# Patient Record
Sex: Female | Born: 1960 | Race: White | Hispanic: No | Marital: Single | Smoking: Never smoker
Health system: Southern US, Community
[De-identification: ages and names within clinical notes are randomized; demographics above are authoritative.]

## PROBLEM LIST (undated history)

## (undated) DIAGNOSIS — N83209 Unspecified ovarian cyst, unspecified side: Secondary | ICD-10-CM

## (undated) DIAGNOSIS — I2699 Other pulmonary embolism without acute cor pulmonale: Secondary | ICD-10-CM

## (undated) DIAGNOSIS — I82409 Acute embolism and thrombosis of unspecified deep veins of unspecified lower extremity: Secondary | ICD-10-CM

## (undated) DIAGNOSIS — E7800 Pure hypercholesterolemia, unspecified: Secondary | ICD-10-CM

## (undated) DIAGNOSIS — M1990 Unspecified osteoarthritis, unspecified site: Secondary | ICD-10-CM

## (undated) DIAGNOSIS — N200 Calculus of kidney: Secondary | ICD-10-CM

## (undated) HISTORY — PX: OVARIAN CYST REMOVAL: SHX89

---

## 1998-02-16 ENCOUNTER — Emergency Department (HOSPITAL_COMMUNITY): Admission: EM | Admit: 1998-02-16 | Discharge: 1998-02-16 | Payer: Self-pay | Admitting: *Deleted

## 2000-02-03 ENCOUNTER — Inpatient Hospital Stay (HOSPITAL_COMMUNITY): Admission: EM | Admit: 2000-02-03 | Discharge: 2000-02-05 | Payer: Self-pay | Admitting: Emergency Medicine

## 2000-02-03 ENCOUNTER — Encounter: Payer: Self-pay | Admitting: Emergency Medicine

## 2001-01-18 ENCOUNTER — Emergency Department (HOSPITAL_COMMUNITY): Admission: EM | Admit: 2001-01-18 | Discharge: 2001-01-19 | Payer: Self-pay | Admitting: Emergency Medicine

## 2004-08-08 ENCOUNTER — Emergency Department (HOSPITAL_COMMUNITY): Admission: EM | Admit: 2004-08-08 | Discharge: 2004-08-08 | Payer: Self-pay | Admitting: Emergency Medicine

## 2004-11-09 ENCOUNTER — Emergency Department (HOSPITAL_COMMUNITY): Admission: EM | Admit: 2004-11-09 | Discharge: 2004-11-10 | Payer: Self-pay

## 2004-11-09 ENCOUNTER — Ambulatory Visit: Payer: Self-pay | Admitting: Internal Medicine

## 2005-09-08 ENCOUNTER — Emergency Department (HOSPITAL_COMMUNITY): Admission: EM | Admit: 2005-09-08 | Discharge: 2005-09-08 | Payer: Self-pay | Admitting: Emergency Medicine

## 2005-09-10 ENCOUNTER — Emergency Department (HOSPITAL_COMMUNITY): Admission: EM | Admit: 2005-09-10 | Discharge: 2005-09-10 | Payer: Self-pay | Admitting: *Deleted

## 2005-10-12 ENCOUNTER — Emergency Department (HOSPITAL_COMMUNITY): Admission: EM | Admit: 2005-10-12 | Discharge: 2005-10-12 | Payer: Self-pay | Admitting: Emergency Medicine

## 2005-10-24 ENCOUNTER — Emergency Department (HOSPITAL_COMMUNITY): Admission: EM | Admit: 2005-10-24 | Discharge: 2005-10-24 | Payer: Self-pay | Admitting: Emergency Medicine

## 2006-01-17 ENCOUNTER — Emergency Department (HOSPITAL_COMMUNITY): Admission: EM | Admit: 2006-01-17 | Discharge: 2006-01-17 | Payer: Self-pay | Admitting: Emergency Medicine

## 2006-01-19 ENCOUNTER — Emergency Department (HOSPITAL_COMMUNITY): Admission: EM | Admit: 2006-01-19 | Discharge: 2006-01-19 | Payer: Self-pay | Admitting: Emergency Medicine

## 2006-01-23 ENCOUNTER — Emergency Department (HOSPITAL_COMMUNITY): Admission: EM | Admit: 2006-01-23 | Discharge: 2006-01-24 | Payer: Self-pay | Admitting: *Deleted

## 2006-01-28 ENCOUNTER — Emergency Department (HOSPITAL_COMMUNITY): Admission: EM | Admit: 2006-01-28 | Discharge: 2006-01-29 | Payer: Self-pay | Admitting: Emergency Medicine

## 2006-08-04 ENCOUNTER — Emergency Department (HOSPITAL_COMMUNITY): Admission: EM | Admit: 2006-08-04 | Discharge: 2006-08-04 | Payer: Self-pay | Admitting: Emergency Medicine

## 2006-08-05 ENCOUNTER — Emergency Department (HOSPITAL_COMMUNITY): Admission: EM | Admit: 2006-08-05 | Discharge: 2006-08-05 | Payer: Self-pay | Admitting: Emergency Medicine

## 2006-08-22 ENCOUNTER — Emergency Department (HOSPITAL_COMMUNITY): Admission: EM | Admit: 2006-08-22 | Discharge: 2006-08-22 | Payer: Self-pay | Admitting: Emergency Medicine

## 2006-10-01 ENCOUNTER — Emergency Department (HOSPITAL_COMMUNITY): Admission: EM | Admit: 2006-10-01 | Discharge: 2006-10-01 | Payer: Self-pay | Admitting: Emergency Medicine

## 2007-01-13 ENCOUNTER — Emergency Department (HOSPITAL_COMMUNITY): Admission: EM | Admit: 2007-01-13 | Discharge: 2007-01-13 | Payer: Self-pay | Admitting: Emergency Medicine

## 2008-08-14 ENCOUNTER — Emergency Department (HOSPITAL_COMMUNITY): Admission: EM | Admit: 2008-08-14 | Discharge: 2008-08-14 | Payer: Self-pay | Admitting: Emergency Medicine

## 2008-12-14 ENCOUNTER — Emergency Department (HOSPITAL_COMMUNITY): Admission: EM | Admit: 2008-12-14 | Discharge: 2008-12-14 | Payer: Self-pay | Admitting: Emergency Medicine

## 2009-02-18 ENCOUNTER — Emergency Department (HOSPITAL_COMMUNITY): Admission: EM | Admit: 2009-02-18 | Discharge: 2009-02-18 | Payer: Self-pay | Admitting: Emergency Medicine

## 2009-07-09 ENCOUNTER — Emergency Department (HOSPITAL_COMMUNITY): Admission: EM | Admit: 2009-07-09 | Discharge: 2009-07-09 | Payer: Self-pay | Admitting: Emergency Medicine

## 2009-09-11 ENCOUNTER — Emergency Department (HOSPITAL_COMMUNITY): Admission: EM | Admit: 2009-09-11 | Discharge: 2009-09-11 | Payer: Self-pay | Admitting: Emergency Medicine

## 2009-09-12 ENCOUNTER — Emergency Department (HOSPITAL_COMMUNITY): Admission: EM | Admit: 2009-09-12 | Discharge: 2009-09-12 | Payer: Self-pay | Admitting: Emergency Medicine

## 2009-10-17 ENCOUNTER — Emergency Department (HOSPITAL_COMMUNITY): Admission: EM | Admit: 2009-10-17 | Discharge: 2009-10-17 | Payer: Self-pay | Admitting: Emergency Medicine

## 2009-10-29 ENCOUNTER — Encounter: Payer: Self-pay | Admitting: Internal Medicine

## 2009-11-01 ENCOUNTER — Telehealth: Payer: Self-pay | Admitting: Internal Medicine

## 2009-12-28 ENCOUNTER — Emergency Department (HOSPITAL_COMMUNITY): Admission: EM | Admit: 2009-12-28 | Discharge: 2009-12-28 | Payer: Self-pay | Admitting: Emergency Medicine

## 2010-01-17 ENCOUNTER — Emergency Department (HOSPITAL_COMMUNITY): Admission: EM | Admit: 2010-01-17 | Discharge: 2010-01-17 | Payer: Self-pay | Admitting: Emergency Medicine

## 2010-04-05 ENCOUNTER — Emergency Department (HOSPITAL_COMMUNITY): Admission: EM | Admit: 2010-04-05 | Discharge: 2010-04-05 | Payer: Self-pay | Admitting: Emergency Medicine

## 2010-07-11 ENCOUNTER — Emergency Department (HOSPITAL_COMMUNITY): Admission: EM | Admit: 2010-07-11 | Discharge: 2010-07-11 | Payer: Self-pay | Admitting: Emergency Medicine

## 2010-07-22 ENCOUNTER — Emergency Department (HOSPITAL_COMMUNITY): Admission: EM | Admit: 2010-07-22 | Discharge: 2010-07-22 | Payer: Self-pay | Admitting: Emergency Medicine

## 2010-07-25 ENCOUNTER — Telehealth: Payer: Self-pay | Admitting: Internal Medicine

## 2010-07-31 ENCOUNTER — Encounter: Payer: Self-pay | Admitting: Internal Medicine

## 2010-09-26 ENCOUNTER — Emergency Department (HOSPITAL_COMMUNITY)
Admission: EM | Admit: 2010-09-26 | Discharge: 2010-09-26 | Payer: Self-pay | Source: Home / Self Care | Admitting: Emergency Medicine

## 2010-09-29 ENCOUNTER — Encounter (INDEPENDENT_AMBULATORY_CARE_PROVIDER_SITE_OTHER): Payer: Self-pay | Admitting: *Deleted

## 2010-11-07 ENCOUNTER — Encounter (INDEPENDENT_AMBULATORY_CARE_PROVIDER_SITE_OTHER): Payer: Self-pay | Admitting: *Deleted

## 2010-11-07 ENCOUNTER — Ambulatory Visit
Admission: RE | Admit: 2010-11-07 | Discharge: 2010-11-07 | Payer: Self-pay | Source: Home / Self Care | Attending: Internal Medicine | Admitting: Internal Medicine

## 2010-11-07 ENCOUNTER — Emergency Department (HOSPITAL_COMMUNITY)
Admission: EM | Admit: 2010-11-07 | Discharge: 2010-11-07 | Payer: Self-pay | Source: Home / Self Care | Admitting: Emergency Medicine

## 2010-11-07 DIAGNOSIS — F810 Specific reading disorder: Secondary | ICD-10-CM | POA: Insufficient documentation

## 2010-11-07 DIAGNOSIS — Z87442 Personal history of urinary calculi: Secondary | ICD-10-CM | POA: Insufficient documentation

## 2010-11-07 DIAGNOSIS — N83209 Unspecified ovarian cyst, unspecified side: Secondary | ICD-10-CM | POA: Insufficient documentation

## 2010-11-07 DIAGNOSIS — D72819 Decreased white blood cell count, unspecified: Secondary | ICD-10-CM | POA: Insufficient documentation

## 2010-11-07 DIAGNOSIS — K0520 Aggressive periodontitis, unspecified: Secondary | ICD-10-CM | POA: Insufficient documentation

## 2010-11-07 DIAGNOSIS — H2100 Hyphema, unspecified eye: Secondary | ICD-10-CM | POA: Insufficient documentation

## 2010-11-07 DIAGNOSIS — R7401 Elevation of levels of liver transaminase levels: Secondary | ICD-10-CM | POA: Insufficient documentation

## 2010-11-07 DIAGNOSIS — R740 Nonspecific elevation of levels of transaminase and lactic acid dehydrogenase [LDH]: Secondary | ICD-10-CM

## 2010-11-08 ENCOUNTER — Encounter (INDEPENDENT_AMBULATORY_CARE_PROVIDER_SITE_OTHER): Payer: Self-pay | Admitting: *Deleted

## 2010-11-08 ENCOUNTER — Encounter: Payer: Self-pay | Admitting: Internal Medicine

## 2010-11-14 ENCOUNTER — Encounter: Payer: Self-pay | Admitting: Internal Medicine

## 2010-11-15 ENCOUNTER — Encounter: Payer: Self-pay | Admitting: Internal Medicine

## 2010-11-16 ENCOUNTER — Encounter: Payer: Self-pay | Admitting: Internal Medicine

## 2010-11-22 ENCOUNTER — Encounter (INDEPENDENT_AMBULATORY_CARE_PROVIDER_SITE_OTHER): Payer: Self-pay | Admitting: *Deleted

## 2010-11-23 NOTE — Progress Notes (Signed)
Summary: Clinical list update--flu vaccine  Phone Note From Pharmacy   Caller: Karin Golden [place] Dr. Rondall Allegra [place] Summary of Call: Made note of flu vaccine given at pharmacy. Initial call taken by: Lucious Groves,  November 01, 2009 11:08 AM      Immunization History:  Influenza Immunization History:    Influenza:  historical (10/29/2009)

## 2010-11-23 NOTE — Miscellaneous (Signed)
Summary: [facility]  [facility]   Imported By: Lester Davenport 11/03/2009 08:25:57  _____________________________________________________________________  External Attachment:    Type:   Image     Comment:   External Document

## 2010-11-23 NOTE — Letter (Signed)
Summary: Discharge Letter  [facility]  [address] [place], [place] [address]   Phone: [phone]  Fax: [phone]       11/08/2010 MRN: [id number]  YANELI KEITHLEY [address] [place], [place]  [address]  Dear [name],   I find it necessary to inform you that I will not be able to provide medical care to you, because you left during the intake visit stating that you were uncomfortable continuing the process of establishing as a patient with me in this practice.  I suggest that you place your self under the care of another physician without delay. Being that you have not established with the Practice should you have any acute medical problem you should seek assistance at an emergency room or urgent care center until you can establish with another primary care physician. You may want to call the local medical society or [facility]'s physician referral service [phone]) for their assistance in locating a new physician. With your written authorization, I will make a copy of your medical record available to your new physician.   Sincerely,    Illene Regulus MD

## 2010-11-23 NOTE — Letter (Signed)
Summary: Discharge Letter  [facility]  [address] [place], [place] [address]   Phone: [phone]  Fax: [phone]       11/15/2010 MRN: [id number]  Carrie [name] [address] [place], [place]  [address]  Dear [name],   I find it necessary to inform you that I will not be able to provide medical care to you, because of a failed doctor-patient.  I suggest that you place your self under the care of another physician without delay. If you desire, I will be available for emergency care for 30 days after you receive this letter.  This should give you ample time to select a physician of your choice from the many competent providers in this area. You may want to call the local medical society or [facility]'s physician referral service [phone]) for their assistance in locating a new physician. With your written authorization, I will make a copy of your medical record available to your new physician.   Sincerely,    Illene Regulus MD

## 2010-11-23 NOTE — Assessment & Plan Note (Addendum)
Summary: NEW/ MEDCOST /NWS  #   Vital Signs:  Patient profile:   50 year old female Height:      64 inches Weight:      174 pounds BMI:     29.97 Temp:     99.4 degrees F oral Pulse rate:   106 / minute Pulse rhythm:   regular Resp:     16 per minute BP sitting:   130 / 80  (left arm) Cuff size:   regular  Vitals Entered By: Lanier Prude, CMA(AAMA) (November 07, 2010 1:34 PM) CC: Radiaiton poisoning  and Leukopenia Is Patient Diabetic? No Comments pt had freeze dried periodontal bone grafting in 06/2010 and then had implant removed because she experienced leg weakness and numbness.   Primary Care Provider:  Illene Regulus  CC:  Radiaiton poisoning  and Leukopenia.  History of Present Illness: Patient presents for to establish for on-going continuity care.   Her chief complaint has to do with her concern about the effects of bone implant for oromaximal surgery using cobalt treated cadaveric bone. Feels she has had radiation sickness. Her complex of symptoms include  paresthesia, weakness. She was insistent that this issue be addressed immediately so she could be cleared for surgery.   She describes herself as healthy without any other healthcare concerns.   Current Medications (verified): 1)  Prenavite Multiple Vitamin 28-0.8 Mg Tabs (Prenatal Vit-Fe Fumarate-Fa) .Marland Kitchen.. 1 By Mouth Once Daily  Allergies (verified): 1)  ! Radioactive Material 2)  ! * Contrast Dye  Past History:  Past Medical History: UCD - chicken pox and measles. 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  Resumed in the past several Dental extractions.  Family History: father - deceased @ 13: lung cancer, smoker, prostate, HTN mother - 30: lung -scca, right lung s/p lobectomy, smoker, LIpids, HTN Neg - breast or colon cancer; DM; CAD/MI;   Social History: HSG, AA degree, Adm degree, in school for BA married '98 - less than a year, annuled.  Lives alone; not sexually active No physical or sexual abuse. No smoke, no drink.  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  Family in area: mother in [place], sister in [place], sister [place], sister in [place] -phlobotomist ; brother in [place]  Review of Systems  The patient denies anorexia, fever, weight loss, weight gain, vision loss, decreased hearing, chest pain, syncope, dyspnea on exertion, peripheral edema, hemoptysis, abdominal pain, severe indigestion/heartburn, incontinence, muscle weakness, suspicious skin lesions, difficulty walking, depression, abnormal bleeding, enlarged lymph nodes, angioedema, and breast masses.    Physical Exam  General:  WNWD white woman who seems agitated with rapid and pressured speech, pacing the exam room, unable to stay focused on giving a history, continuously shuffling through a file of papers and records she brought with her.    Impression & Recommendations:  Problem # 1:  termination of visit after getting through the balance of the intake history, while reviewing her lab reports which seemed unremarkable, the patient became increasingly agitated. She stated that she "felt bad and uncomfortable" and stated she had to leave. She gathered her documents and left the exam room and the clinical area. Of note  the patient was never examined having terminated the visit prior to any physical exam.  Her behavior was aberrant: obsessive about her issues, agitated with rapid pressured speech and impatient with the intake process.  Having walked out of the  visit she was not established as a patient. A discharge letter will be generated. For any other medical care issues she will need to report to the Emergency Department until such time as she can establish with a medical practice.   Complete Medication List: 1)  Prenavite Multiple Vitamin 28-0.8 Mg Tabs (Prenatal vit-fe fumarate-fa) .Marland Kitchen.. 1 by mouth once daily  Other Orders: No Charge Patient Arrived (NCPA0) (NCPA0)   Orders Added: 1)  No Charge Patient Arrived (NCPA0) [NCPA0]  Appended Document: NEW/ MEDCOST /NWS  # The patient requested this visit to be amended.    [facility] Legal Reference Manual 7th edition states the following under G. Patient Requests to [facility] Records (paragraph 4, page 6):  "The provider must append or link the request for information, denial, any written statement of disagreement, and any rebuttal to the disputed information or record.  When making future disclosures of this information, the provider must include copies of these documents when the individual has either submitted a statement of disagreement or requests that the provider include a copy of the documents."  Please see the following scanned documents related to the request.  Please contact HIM Manager prior to releasing any of these scanned documents.  09/29/10 - Internal Other - Walk In Patient Form 11/07/10 - External Correspondence - Letter Typed by patient - Birdie Fetty refused to sign; (she wanted Dr. Debby Bud to sign this letter.) 11/08/10 - Ext. Corr: Handwritten Note by Patient 11/08/10 - Int.Oth: Transcript of Admin. Mtg. w/pt. 11/14/10 - Ext Corr.: 1st Letter Requesting to Amend Med. Red. 11/16/10 - Int Oth.:  Admin E-mails related to request 11/22/10 - Ext. Corr.:  [employer] Letter 11/24/10 - Letter:  Denial to Amend Med. Rec.  Appended Document: NEW/ MEDCOST /NWS  # Also see the followind document dropped off by the patient:  12/05/2010 - External Correspondence - Letter of Dissent from  Patient

## 2010-11-23 NOTE — Letter (Addendum)
Summary: Discharge Letter  [facility]  [address] [place], [place] [address]   Phone: [phone]  Fax: [phone]       11/15/2010 MRN: [id number]  Carrie [name] [address] [place], [place]  [address]  Dear [name],   I find it necessary to inform you that I will not be able to provide medical care to you due to a failed doctor-patient relationship.  Since your condition requires medical attention, I suggest that you place your self under the care of another physician without delay. If you desire, I will be available for emergency care for 30 days after you receive this letter.  This should give you ample time to select a physician of your choice from the many competent providers in this area. You may want to call the local medical society or [facility]'s physician referral service [phone]) for their assistance in locating a new physician. With your written authorization, I will make a copy of your medical record available to your new physician.   Sincerely,    Illene Regulus MD  Appended Document: Letter mailed IDX and EMR updated to reflect dismissal. Letter sent out by Registered / Certified mail.   Appended Document: Copy of Discharge Letter remailed Copy of letter dated 11/15/10 sent out by certified/registered mail.  Appended Document: USPS receipt received USPS signed receipt numbered [id number] received at [facility] on 12/26/10. This receipt is from the letter mailed on 12/20/10. Receipt will be scanned into EMR to support delivery of letter.

## 2010-11-23 NOTE — Progress Notes (Signed)
Summary: SWITCH PCP  Phone Note Call from Patient Call back at [facility] Phone [phone]   Caller: Patient Summary of Call: PT SAW DR Jonny Ruiz IN 2006 AS A NEW PT.  SHE WANTS TO SWITCH TO DR. Debby Bud.  SHE IS SCHEDULED TO SEE DR YOO ON FRIDAY AS A NEW PT, BUT WOULD RATHER COME HERE.  SHE PLANS TO CANCEL THE APPT WITH DR Artist Pais.  OK TO SWITCH? Initial call taken by: Hilarie Fredrickson,  July 25, 2010 9:02 AM  Follow-up for Phone Call        ok with me Follow-up by: Corwin Levins MD,  July 25, 2010 9:44 AM  Additional Follow-up for Phone Call Additional follow up Details #1::        OK Additional Follow-up by: Jacques Navy MD,  July 25, 2010 3:38 PM    Additional Follow-up for Phone Call Additional follow up Details #2::    PT IS AWARE AND HAS SCHEDULED AN APPT. Follow-up by: Hilarie Fredrickson,  July 25, 2010 4:45 PM

## 2010-11-23 NOTE — Letter (Signed)
Summary: [facility]   Imported By: Sherian Rein 08/04/2010 12:23:32  _____________________________________________________________________  External Attachment:    Type:   Image     Comment:   External Document

## 2010-11-24 ENCOUNTER — Encounter: Payer: Self-pay | Admitting: Internal Medicine

## 2010-11-24 ENCOUNTER — Encounter (INDEPENDENT_AMBULATORY_CARE_PROVIDER_SITE_OTHER): Payer: Self-pay | Admitting: *Deleted

## 2010-12-05 ENCOUNTER — Encounter (INDEPENDENT_AMBULATORY_CARE_PROVIDER_SITE_OTHER): Payer: Self-pay | Admitting: *Deleted

## 2010-12-07 NOTE — Letter (Signed)
Summary: Transcript of Admin. Mtg. w/pt.  Transcript of Admin. Mtg. w/pt.   Imported By: Briant Cedar 11/27/2010 13:47:21  _____________________________________________________________________  External Attachment:    Type:   Image     Comment:   External Document

## 2010-12-07 NOTE — Letter (Signed)
Summary: Administrative e-mails related to request  Administrative e-mails related to request   Imported By: Briant Cedar 11/27/2010 13:38:15  _____________________________________________________________________  External Attachment:    Type:   Image     Comment:   External Document

## 2010-12-07 NOTE — Letter (Signed)
Summary: Walk In Patient Form  Walk In Patient Form   Imported By: Briant Cedar 11/27/2010 13:45:24  _____________________________________________________________________  External Attachment:    Type:   Image     Comment:   External Document

## 2010-12-07 NOTE — Letter (Signed)
Summary: Denial to Amend Med. Rec.  Denial to Amend Med. Rec.   Imported By: Briant Cedar 11/27/2010 13:48:47  _____________________________________________________________________  External Attachment:    Type:   Image     Comment:   External Document

## 2010-12-07 NOTE — Letter (Signed)
Summary: 1st Letter Requesting to Amend Medical Record  1st Letter Requesting to Amend Medical Record   Imported By: Briant Cedar 11/27/2010 13:36:04  _____________________________________________________________________  External Attachment:    Type:   Image     Comment:   External Document

## 2010-12-07 NOTE — Letter (Signed)
Summary: [employer] Letter   [employer] Letter   Imported By: Briant Cedar 11/27/2010 13:50:27  _____________________________________________________________________  External Attachment:    Type:   Image     Comment:   External Document

## 2010-12-07 NOTE — Letter (Signed)
Summary: Handwritten Note by Patient  Handwritten Note by Patient   Imported By: Briant Cedar 11/27/2010 13:44:01  _____________________________________________________________________  External Attachment:    Type:   Image     Comment:   External Document

## 2010-12-07 NOTE — Letter (Signed)
Summary: Letter typed by patient-Norins refused to sign  Letter typed by patient-Norins refused to sign   Imported By: Briant Cedar 11/27/2010 13:42:33  _____________________________________________________________________  External Attachment:    Type:   Image     Comment:   External Document

## 2010-12-13 NOTE — Letter (Signed)
Summary: Letter of Dissent from Patient  Letter of Dissent from Patient   Imported By: Briant Cedar 12/07/2010 14:07:38  _____________________________________________________________________  External Attachment:    Type:   Image     Comment:   External Document

## 2010-12-19 ENCOUNTER — Telehealth (INDEPENDENT_AMBULATORY_CARE_PROVIDER_SITE_OTHER): Payer: Self-pay | Admitting: *Deleted

## 2010-12-20 ENCOUNTER — Telehealth (INDEPENDENT_AMBULATORY_CARE_PROVIDER_SITE_OTHER): Payer: Self-pay | Admitting: *Deleted

## 2010-12-22 ENCOUNTER — Encounter (INDEPENDENT_AMBULATORY_CARE_PROVIDER_SITE_OTHER): Payer: Self-pay | Admitting: *Deleted

## 2010-12-26 ENCOUNTER — Emergency Department (HOSPITAL_COMMUNITY): Payer: PRIVATE HEALTH INSURANCE

## 2010-12-26 ENCOUNTER — Emergency Department (HOSPITAL_COMMUNITY)
Admission: EM | Admit: 2010-12-26 | Discharge: 2010-12-26 | Disposition: A | Discharge status: Home / Self Care | Payer: PRIVATE HEALTH INSURANCE | Attending: Emergency Medicine | Admitting: Emergency Medicine

## 2010-12-26 DIAGNOSIS — S9030XA Contusion of unspecified foot, initial encounter: Secondary | ICD-10-CM | POA: Insufficient documentation

## 2010-12-26 DIAGNOSIS — M79609 Pain in unspecified limb: Secondary | ICD-10-CM | POA: Insufficient documentation

## 2010-12-26 DIAGNOSIS — IMO0002 Reserved for concepts with insufficient information to code with codable children: Secondary | ICD-10-CM | POA: Insufficient documentation

## 2010-12-28 NOTE — Progress Notes (Signed)
  Phone Note Other Incoming   Request: Send information Summary of Call: Patient completed a [facility] HIPAA Restrictions Notice of Privacy Practices Receipt.

## 2010-12-28 NOTE — Progress Notes (Signed)
Summary: Call from patient concerning letter   _____________________________________________________________________  External Attachment:    Type:   Image     Comment:   External DocumentPhone Note Call from Patient   Caller: Patient Details for Reason: Refusal of Certified Letter Details of Complaint: Letter arrived with postage due. Details of Action Taken: Will send out second letter. Summary of Call: Patient called refusing to pay for postage for a certified letter. I informed her that the letter was sent out with the postage paid and if she was being asked to pay for it to simply refuse and I would send out another letter. She asked me the natural of the letter and if she could just swing by and pick one up. I told her that was not necessary we would send out a copy immediately. I did not discuss the contents of the letter even though she wanted to know if I was the Manager who was in her taped request?   Sheria Lang was called to find out if the letter dated 11/24/10 was sent out by Certified or First Class Mail to determine which letter this call was concerning. Cameron's letter was mailed first class. The Discharge from the Practice letter was mailed 11/15/10. Upon checking the USPS website (see attached external document) it is documented that they have made several attempts to deliver and have left notices. In the event the postage sticker may be missing from the letter I will send out a second certified letter so this patient can be informed of the contents instead of waiting for the 1st letter to be returned.  Initial call taken by: Lenard Forth  Follow-up for Phone Call        Letter sent out by certified mail 12/20/10.

## 2011-01-01 LAB — CBC
HCT: 40.3 % (ref 36.0–46.0)
Hemoglobin: 13.7 g/dL (ref 12.0–15.0)
MCH: 29.9 pg (ref 26.0–34.0)
MCV: 88 fL (ref 78.0–100.0)
Platelets: 202 10*3/uL (ref 150–400)
RBC: 4.58 MIL/uL (ref 3.87–5.11)
RDW: 13.3 % (ref 11.5–15.5)

## 2011-01-01 LAB — DIFFERENTIAL
Basophils Relative: 0 % (ref 0–1)
Lymphocytes Relative: 35 % (ref 12–46)
Neutro Abs: 2.9 10*3/uL (ref 1.7–7.7)

## 2011-01-02 NOTE — Letter (Signed)
Summary: Response to Request for Amendment  Response to Request for Amendment   Imported By: Briant Cedar 12/25/2010 11:45:54  _____________________________________________________________________  External Attachment:    Type:   Image     Comment:   External Document

## 2011-01-03 LAB — URINALYSIS, ROUTINE W REFLEX MICROSCOPIC
Glucose, UA: NEGATIVE mg/dL
Hgb urine dipstick: NEGATIVE
Nitrite: NEGATIVE
pH: 5.5 (ref 5.0–8.0)

## 2011-01-03 LAB — COMPREHENSIVE METABOLIC PANEL
Albumin: 3.5 g/dL (ref 3.5–5.2)
Alkaline Phosphatase: 39 U/L (ref 39–117)
CO2: 22 mEq/L (ref 19–32)
GFR calc Af Amer: >60 mL/min (ref >60)
GFR calc non Af Amer: >60 mL/min (ref >60)
Glucose, Bld: 90 mg/dL (ref 70–99)
Total Bilirubin: 0.6 mg/dL (ref 0.3–1.2)

## 2011-01-03 LAB — CBC
Hemoglobin: 13.5 g/dL (ref 12.0–15.0)
MCH: 29.9 pg (ref 26.0–34.0)
MCHC: 33.9 g/dL (ref 30.0–36.0)
MCV: 88.2 fL (ref 78.0–100.0)
RBC: 4.51 MIL/uL (ref 3.87–5.11)

## 2011-01-03 LAB — POCT PREGNANCY, URINE: Preg Test, Ur: NEGATIVE

## 2011-01-03 LAB — DIFFERENTIAL
Eosinophils Absolute: 0 10*3/uL (ref 0.0–0.7)
Eosinophils Relative: 0 % (ref 0–5)
Lymphocytes Relative: 21 % (ref 12–46)
Lymphs Abs: 1.1 10*3/uL (ref 0.7–4.0)
Neutrophils Relative %: 71 % (ref 43–77)

## 2011-01-09 NOTE — Letter (Signed)
Summary: Patient Dismissal Activation Form & USPS receipt  Patient Dismissal Activation Form & USPS receipt   Imported By: Lenard Forth 01/02/2011 18:00:02  _____________________________________________________________________  External Attachment:    Type:   Image     Comment:   External Document

## 2011-01-12 LAB — BASIC METABOLIC PANEL
BUN: 10 mg/dL (ref 6–23)
CO2: 25 mEq/L (ref 19–32)
Chloride: 108 mEq/L (ref 96–112)
Creatinine, Ser: 0.57 mg/dL (ref 0.4–1.2)
GFR calc Af Amer: >60 mL/min (ref >60)
GFR calc non Af Amer: >60 mL/min (ref >60)
Glucose, Bld: 110 mg/dL — ABNORMAL HIGH (ref 70–99)
Glucose, Bld: 98 mg/dL (ref 70–99)
Potassium: 3.7 mEq/L (ref 3.5–5.1)
Potassium: 4 mEq/L (ref 3.5–5.1)
Sodium: 142 mEq/L (ref 135–145)

## 2011-01-12 LAB — CBC
HCT: 38.5 % (ref 36.0–46.0)
HCT: 40 % (ref 36.0–46.0)
Hemoglobin: 12.9 g/dL (ref 12.0–15.0)
Hemoglobin: 13.8 g/dL (ref 12.0–15.0)
MCHC: 33.6 g/dL (ref 30.0–36.0)
MCHC: 34.6 g/dL (ref 30.0–36.0)
MCV: 88.2 fL (ref 78.0–100.0)
RBC: 4.36 MIL/uL (ref 3.87–5.11)
RDW: 13.9 % (ref 11.5–15.5)
RDW: 14.2 % (ref 11.5–15.5)

## 2011-01-12 LAB — DIFFERENTIAL
Basophils Absolute: 0 10*3/uL (ref 0.0–0.1)
Basophils Relative: 0 % (ref 0–1)
Basophils Relative: 1 % (ref 0–1)
Eosinophils Absolute: 0.1 10*3/uL (ref 0.0–0.7)
Eosinophils Relative: 1 % (ref 0–5)
Eosinophils Relative: 1 % (ref 0–5)
Lymphocytes Relative: 27 % (ref 12–46)
Lymphs Abs: 1.4 10*3/uL (ref 0.7–4.0)
Monocytes Absolute: 0.4 10*3/uL (ref 0.1–1.0)
Monocytes Relative: 8 % (ref 3–12)
Monocytes Relative: 9 % (ref 3–12)

## 2011-01-12 LAB — BRAIN NATRIURETIC PEPTIDE: Pro B Natriuretic peptide (BNP): 50 pg/mL (ref 0.0–100.0)

## 2011-01-12 LAB — POCT CARDIAC MARKERS
Myoglobin, poc: 33 ng/mL (ref 12–200)
Troponin i, poc: <0.05 ng/mL (ref 0.00–0.09)

## 2011-01-12 LAB — TROPONIN I: Troponin I: <0.01 ng/mL (ref 0.00–0.06)

## 2011-01-20 ENCOUNTER — Emergency Department (HOSPITAL_COMMUNITY)
Admission: EM | Admit: 2011-01-20 | Discharge: 2011-01-20 | Disposition: A | Discharge status: Home / Self Care | Payer: PRIVATE HEALTH INSURANCE | Attending: Emergency Medicine | Admitting: Emergency Medicine

## 2011-01-20 DIAGNOSIS — H9209 Otalgia, unspecified ear: Secondary | ICD-10-CM | POA: Insufficient documentation

## 2011-01-20 DIAGNOSIS — H60399 Other infective otitis externa, unspecified ear: Secondary | ICD-10-CM | POA: Insufficient documentation

## 2011-03-09 NOTE — Consult Note (Signed)
[name]  Patient:    Carrie, [name]                        MRN: [id number] Attending:  Barron [name], M.D. CC:         Carrie [name], M.D. [facility]                          Consultation Report  REASON FOR CONSULTATION:  Ureteral calculus.  HISTORY OF PRESENT ILLNESS:  Carrie [name] is a 50 year old white female.  She believes she had one episode of a spontaneously passed stone in the past.  She reports several days of some lower back and left-sided abdominal discomfort. The pain intensified and she presented to the [facility] Emergency Room on February 03, 2000.  Urinalysis was unremarkable.  She did undergo a CT with stone protocol which revealed what appeared to be a 1-2 mm calcification in the area of the distal left ureter near the ureterovesical junction.  There did appear to be some left hydronephrosis per report but I have not evaluated the films myself at this time.  She was admitted for observation and pain management.  The pain has persisted and the patient did not want to be discharged.  Urology consult was obtained.  She has had some mild irritative voiding symptoms consistent with some bladder irritability due to the calculus.  There has been no evidence of infection.  PAST MEDICAL HISTORY:  Significant for depression and anxiety disorder as well as a questionable eating disorder.  PAST SURGICAL HISTORY:  Noncontributory.  PHYSICAL EXAMINATION:  GENERAL:  She appeared to be comfortable.  I found her to be sitting quietly in bed reading a book.  She denied significant discomfort at this time.  VITAL SIGNS:  She has remained afebrile.  Her blood pressure has been 100/53. She has an indwelling Foley catheter with good urinary output.  ABDOMEN:  Completely benign.  There is no CVA tenderness.  LABORATORIES:  Urinalysis again was unremarkable.  Urinary pH is 7.5.  There did not appear to be evidence of significant pyuria nor was there  hematuria. Her creatinine is normal at 0.7.  ASSESSMENT:  Apparent small distal left ureteral calculus.  I will need to review the films.  Certainly, her pain is consistent with renal colic.  Carrie [name] is told that a stone this size should have an 80-90% chance of spontaneous passage.  There does not appear to be an absolute indication for any intervention at this time.  I found her to be quite comfortable in bed and encouraged her strongly to consider attempts at passing the stone spontaneously as an outpatient if she remained as comfortable as she appeared currently.  She is quite anxious about going home and does not want to have any additional pain.  I told her that we would reassess things in the morning and if she did not feel comfortable going home and was continuing to have pain and elected to have a procedure, then we would go ahead.  She is told that the best option for management of a stone this size and in this location is ureteroscopy.  She is told this is a high successful procedure with minimal complications but there is always a risk to surgery and surgery can be expensive.  She is told that again, we would typically encourage her to try to pass this spontaneously but if she desired surgical  intervention then we would certainly consider that as her option. DD:  02/04/00 TD:  02/04/00 Job: [id number]

## 2011-03-09 NOTE — Discharge Summary (Signed)
[place]  Patient:    Carrie [name], DOYLE                        MRN: [id number] Adm. Date:  [id number] Disc. Date: [id number] Attending:  Duke Salvia Dictator:   Cornell Barman, P.A.                           Discharge Summary  DISCHARGE DIAGNOSES: 1. Left nephrolithiasis. 2. Mild anemia.  BRIEF HISTORY:  Ms. Goracke is a 50 year old white female who presented after seven days of left abdominal pain.  She presented to the [facility]  Emergency Department after promotion of her pain.  A CT of her abdomen revealed a 1 to 2 mm stone at the left UPJ.  The patient was admitted for IV fluid and pain control.  PAST MEDICAL HISTORY:  Status post hemorrhoidectomy with anal fissure repair. CD. Sinusitis.  Depression.  History of eating disorder.  Ovarian cyst.  HOSPITAL COURSE:  The patient was admitted for IV fluids and pain control. Urology was asked to see the patient and Dr. Barron Alvine saw the patient on February 04, 2000.  His assessment was that she had a small distal left ureteral calculus. e certainly felt that her pain was consistent with renal colic.  He discussed with Ms. Denman stating that a stone that size should have an 80-90% chance of spontaneous passage.  He did not feel there was any indication for intervention at that time.  The patient appeared comfortable and he recommended letting the stone pass out n its own.  Plans were for the patient to be discharged home the next morning if her pain was under control and let her pass the stone at home.  The patient was to be instructed on to strain her urine.  The next morning, the patient was evaluated and found to be comfortably lying in bed reading book; however, when we mentioned to the patient that she would be able to go home and wait for the stone to pass at home she became angry and agitated. She states that her urologist was planning on  performing surgery on her that morning.  We deferred discharge until Dr. Barron Alvine could see the patient.  He explained to her that it would be in her best interest not to have any surgery if it were not necessary.  He did not feel that surgery was indicated in this case. After hearing what Dr. Barron Alvine had to say she "fired him" and at the same ime requested a second opinion.  When Dr. Valetta Mole. Swords came by to talk with the patient about discharge, she began crying and pulled the covers over her head.  She would not look at Dr. Valetta Mole. Swords.  She states that the reason she has being is discharged because she id not have any surgery.  The patient was reassured that in this case surgery was ot indicated and if she did need surgery or prolonged hospitalization the matter of insurance would not have kept her from receiving services.  The patient eventually did agree to go home; however, she was still quite angry and frustrated.  Prior to going home, the patient did pass a small stone in her urine. After passing the stone, the patient stated she felt much better.  DISCHARGE INSTRUCTIONS:  The patient was instructed to resume her home medication and  to follow up with Dr. Milagros Loll, III in 7 to 10 days. DD:  02/19/00 TD:  02/20/00 Job: [id number]

## 2011-03-10 IMAGING — CR DG FOOT 2V*R*
1 series · 1 of 1 positions shown · non-contrast
Comparison: None

CLINICAL DATA: Heel pain.  Question foreign body.

RIGHT FOOT - 2 VIEW

[t foot ap right]
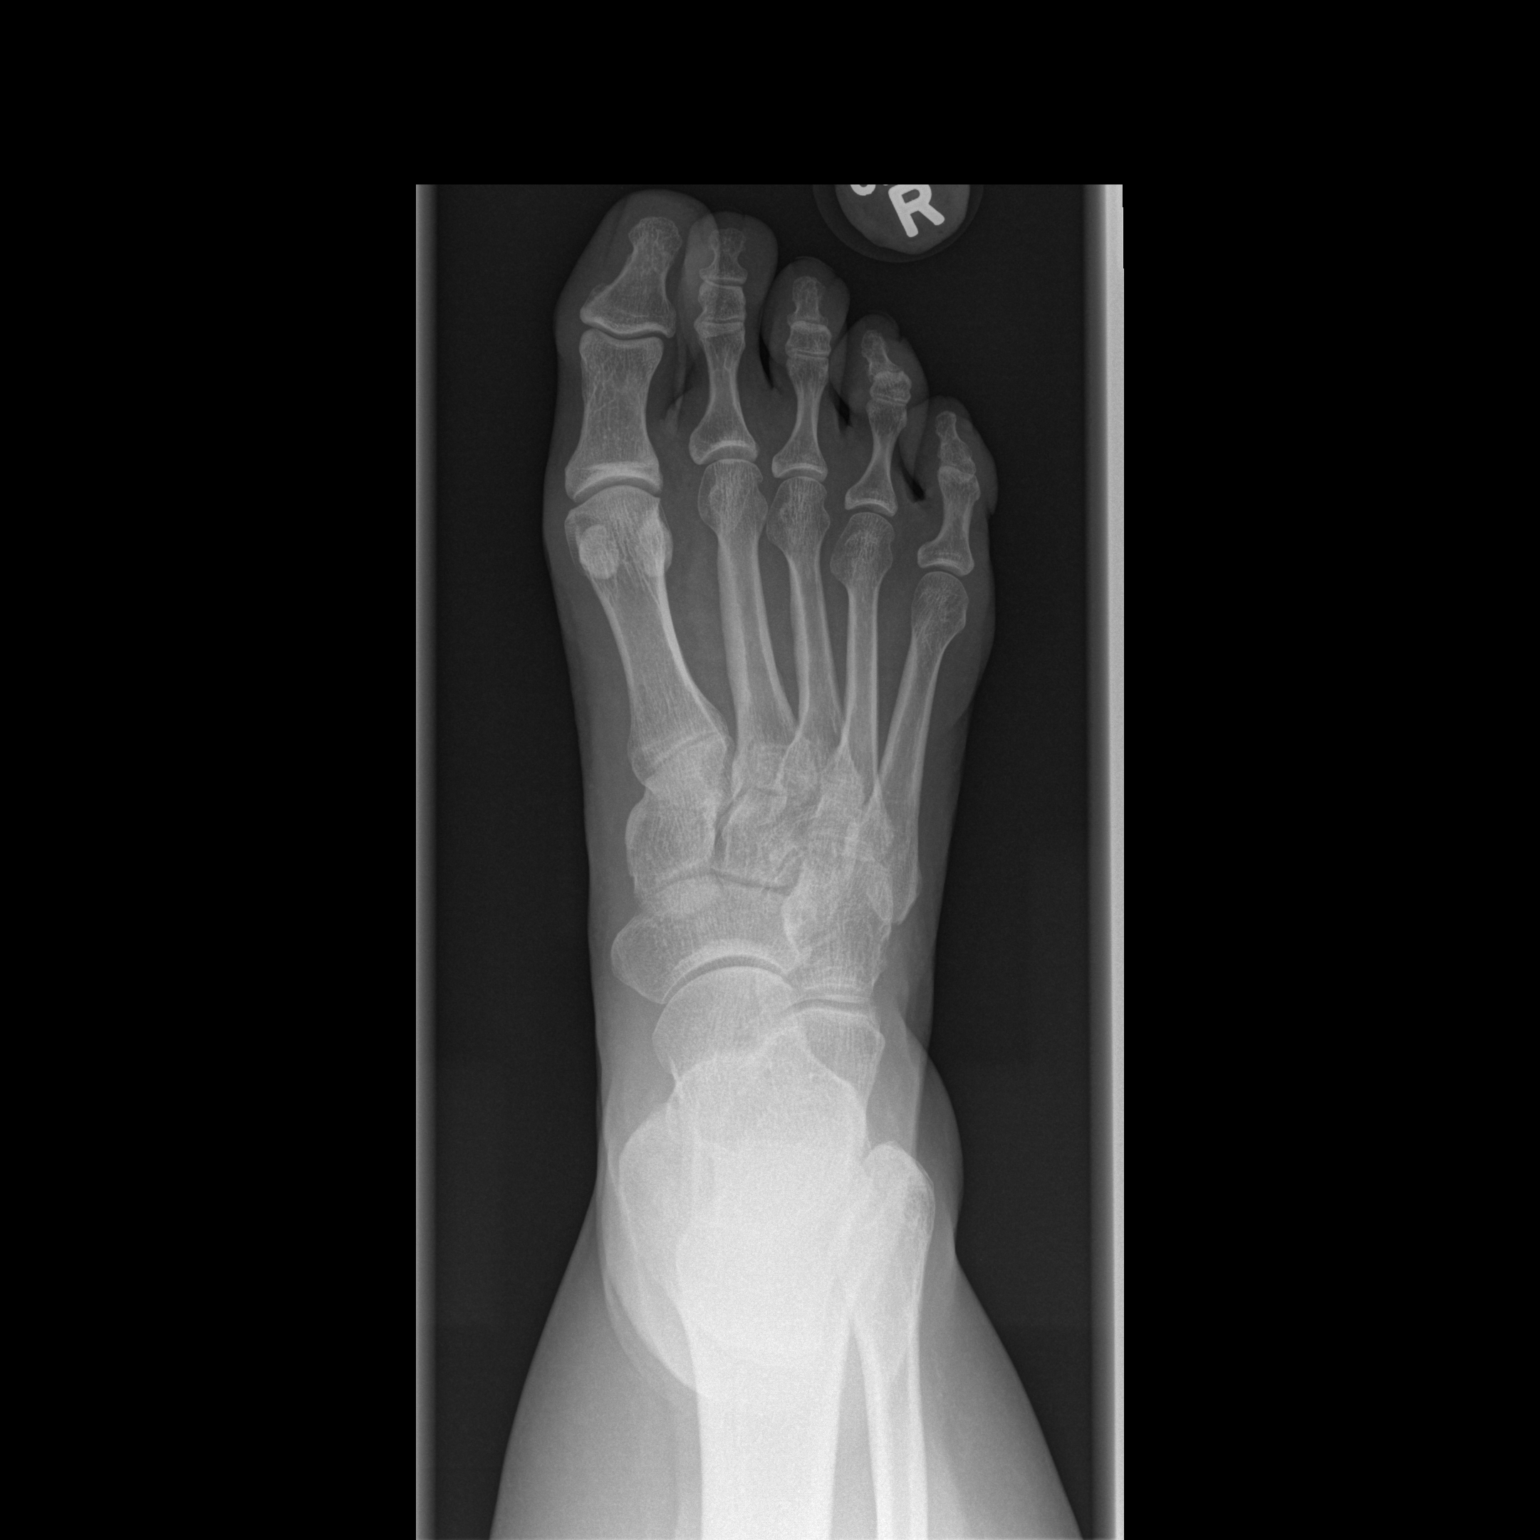

[1 of 1 positions shown; findings below may reference images not displayed]

FINDINGS: No acute bony abnormality.  Specifically, no fracture,
subluxation, or dislocation.  Soft tissues are intact.  No
radiopaque foreign bodies within the soft tissues.
IMPRESSION: No acute bony abnormality.

## 2011-04-09 ENCOUNTER — Emergency Department (HOSPITAL_COMMUNITY)
Admission: EM | Admit: 2011-04-09 | Discharge: 2011-04-09 | Disposition: A | Discharge status: Home / Self Care | Payer: PRIVATE HEALTH INSURANCE | Attending: Emergency Medicine | Admitting: Emergency Medicine

## 2011-04-09 DIAGNOSIS — R197 Diarrhea, unspecified: Secondary | ICD-10-CM | POA: Insufficient documentation

## 2011-04-09 DIAGNOSIS — R000 Tachycardia, unspecified: Secondary | ICD-10-CM | POA: Insufficient documentation

## 2011-04-09 DIAGNOSIS — E860 Dehydration: Secondary | ICD-10-CM | POA: Insufficient documentation

## 2011-04-09 LAB — COMPREHENSIVE METABOLIC PANEL
AST: 21 U/L (ref 0–37)
BUN: 20 mg/dL (ref 6–23)
CO2: 22 mEq/L (ref 19–32)
Calcium: 9.8 mg/dL (ref 8.4–10.5)
Chloride: 102 mEq/L (ref 96–112)
Creatinine, Ser: 0.78 mg/dL (ref 0.50–1.10)
GFR calc Af Amer: >60 mL/min (ref >60)
GFR calc non Af Amer: >60 mL/min (ref >60)
Total Bilirubin: 0.7 mg/dL (ref 0.3–1.2)

## 2011-04-09 LAB — DIFFERENTIAL
Eosinophils Absolute: 0 10*3/uL (ref 0.0–0.7)
Eosinophils Relative: 1 % (ref 0–5)
Lymphocytes Relative: 26 % (ref 12–46)
Lymphs Abs: 1.6 10*3/uL (ref 0.7–4.0)
Monocytes Relative: 11 % (ref 3–12)
Neutrophils Relative %: 62 % (ref 43–77)

## 2011-04-09 LAB — CBC
HCT: 46 % (ref 36.0–46.0)
MCH: 31.5 pg (ref 26.0–34.0)
MCV: 86.1 fL (ref 78.0–100.0)
Platelets: 238 10*3/uL (ref 150–400)
RBC: 5.34 MIL/uL — ABNORMAL HIGH (ref 3.87–5.11)
WBC: 6 10*3/uL (ref 4.0–10.5)

## 2011-04-09 NOTE — ED Provider Notes (Addendum)
Please amend prior note on 04/09/2011.  Under physical exam constitutional please delete anxious appearing.  Thank you  [facility]

## 2011-06-24 ENCOUNTER — Emergency Department (HOSPITAL_COMMUNITY): Payer: PRIVATE HEALTH INSURANCE

## 2011-06-24 ENCOUNTER — Emergency Department (HOSPITAL_COMMUNITY)
Admission: EM | Admit: 2011-06-24 | Discharge: 2011-06-24 | Disposition: A | Discharge status: Home / Self Care | Payer: PRIVATE HEALTH INSURANCE | Attending: Emergency Medicine | Admitting: Emergency Medicine

## 2011-06-24 DIAGNOSIS — IMO0002 Reserved for concepts with insufficient information to code with codable children: Secondary | ICD-10-CM | POA: Insufficient documentation

## 2011-06-24 DIAGNOSIS — R609 Edema, unspecified: Secondary | ICD-10-CM | POA: Insufficient documentation

## 2011-06-24 DIAGNOSIS — S90129A Contusion of unspecified lesser toe(s) without damage to nail, initial encounter: Secondary | ICD-10-CM | POA: Insufficient documentation

## 2011-06-24 DIAGNOSIS — M79609 Pain in unspecified limb: Secondary | ICD-10-CM | POA: Insufficient documentation

## 2012-05-05 ENCOUNTER — Emergency Department (HOSPITAL_COMMUNITY)
Admission: EM | Admit: 2012-05-05 | Discharge: 2012-05-06 | Disposition: A | Discharge status: Home / Self Care | Payer: No Typology Code available for payment source | Attending: Emergency Medicine | Admitting: Emergency Medicine

## 2012-05-05 ENCOUNTER — Encounter (HOSPITAL_COMMUNITY): Payer: Self-pay | Admitting: *Deleted

## 2012-05-05 DIAGNOSIS — M542 Cervicalgia: Secondary | ICD-10-CM | POA: Insufficient documentation

## 2012-05-05 DIAGNOSIS — M549 Dorsalgia, unspecified: Secondary | ICD-10-CM | POA: Insufficient documentation

## 2012-05-05 DIAGNOSIS — T1490XA Injury, unspecified, initial encounter: Secondary | ICD-10-CM | POA: Insufficient documentation
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
## 2012-05-06 ENCOUNTER — Emergency Department (HOSPITAL_COMMUNITY): Payer: No Typology Code available for payment source

## 2012-05-06 MED ORDER — IBUPROFEN 400 MG PO TABS
800.0000 mg | ORAL_TABLET | Freq: Once | ORAL | Status: AC
  Administered 2012-05-06: 800 mg via ORAL
  Filled 2012-05-06: qty 2

## 2012-05-06 MED ORDER — IBUPROFEN 800 MG PO TABS: 800.0000 mg | ORAL_TABLET | Freq: Three times a day (TID) | ORAL | Status: AC | PRN

## 2012-05-06 MED ORDER — ACETAMINOPHEN-CODEINE #3 300-30 MG PO TABS: 1.0000 | ORAL_TABLET | Freq: Four times a day (QID) | ORAL | Status: AC | PRN
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
## 2012-05-06 NOTE — ED Notes (Addendum)
MD at bedside. 

## 2012-05-08 NOTE — ED Provider Notes (Signed)
Medical screening examination/treatment/procedure(s) were performed by non-physician practitioner and as supervising physician I was immediately available for consultation/collaboration.   Shelda Jakes, MD 05/08/12 2157

## 2012-09-14 ENCOUNTER — Emergency Department (HOSPITAL_COMMUNITY)
Admission: EM | Admit: 2012-09-14 | Discharge: 2012-09-14 | Disposition: A | Discharge status: Home / Self Care | Payer: Self-pay | Attending: Emergency Medicine | Admitting: Emergency Medicine

## 2012-09-14 ENCOUNTER — Encounter (HOSPITAL_COMMUNITY): Payer: Self-pay | Admitting: *Deleted

## 2012-09-14 DIAGNOSIS — L290 Pruritus ani: Secondary | ICD-10-CM | POA: Insufficient documentation

## 2012-09-14 LAB — PINWORM PREP: Pinworm Prep - Enterobius: NONE SEEN

## 2012-09-14 NOTE — ED Provider Notes (Signed)
History     CSN: [id number]  Arrival date & time 09/14/12  0407   First MD Initiated Contact with Patient 09/14/12 0445      Chief Complaint  Patient presents with  . Anal Itching    (Consider location/radiation/quality/duration/timing/severity/associated sxs/prior treatment) HPI Comments: Carrie [name] is a 51 y.o. Female who complains of anal pruritis for one week, and passing a "brown, warm", that she describes as several inches long. She is also found "worm casings", in plastic bags, inside her closet. She is unable to specify how they might have gotten there. Today, she drink onion juice, to treat for pinworms, and vomited after that. She self medicated herself with over-the-counter "pain-X", twice over the last 10 days. She has no relief of symptoms with the current treatment. She denies fever, or chills, back, pain, weakness, or dizziness. There are no other modifying factors.  The history is provided by the patient.    History reviewed. No pertinent past medical history.  History reviewed. No pertinent past surgical history.  No family history on file.  History  Substance Use Topics  . Smoking status: Never Smoker   . Smokeless tobacco: Not on file  . Alcohol Use: No    OB History    Grav Para Term Preterm Abortions TAB SAB Ect Mult Living                  Review of Systems  All other systems reviewed and are negative.    Allergies  Review of patient's allergies indicates no known allergies.  Home Medications   Current Outpatient Rx  Name  Route  Sig  Dispense  Refill  . ADULT MULTIVITAMIN W/MINERALS CH   Oral   Take 1 tablet by mouth daily.           BP 133/92  Pulse 79  Resp 18  SpO2 98%  Physical Exam  Nursing note and vitals reviewed. Constitutional: She is oriented to person, place, and time. She appears well-developed and well-nourished. She appears distressed (Anxious).  HENT:  Head: Normocephalic and atraumatic.  Eyes:  Conjunctivae normal and EOM are normal. Pupils are equal, round, and reactive to light.  Neck: Normal range of motion and phonation normal. Neck supple.  Cardiovascular: Normal rate.   Pulmonary/Chest: Effort normal. She exhibits no tenderness.  Musculoskeletal: Normal range of motion.  Neurological: She is alert and oriented to person, place, and time. She has normal strength. She exhibits normal muscle tone.       Normal gait  Skin: Skin is warm and dry.  Psychiatric: Her behavior is normal. Judgment and thought content normal.       Anxious    ED Course  Procedures (including critical care time)   The patient requested a female examiner to look at her anus. Dr. Karma Ganja, agreed to do that.         Labs Reviewed  PINWORM PREP  LAB REPORT - SCANNED  PINWORM PREP   No results found.   1. Anal pruritus       MDM  Possible parasitic infection.        Flint Melter, MD 09/15/12 [phone]

## 2012-09-14 NOTE — ED Notes (Signed)
Dr. Karma Ganja at bedside to do pinworm examination.

## 2012-09-14 NOTE — ED Provider Notes (Signed)
Pt requested female physician to perform rectal exam.  Tape test performed and placed on slide.  No visible rectal abnormalities except scattered skin tags.  On inner buttocks near anal opening there is bilateral erythema and excoriations.  DRE revealed no stool in vault, no masses, nontender.  No blood.  Ethelda Chick, MD 09/14/12 [phone]

## 2012-09-14 NOTE — ED Notes (Signed)
C/o anal itching. Relates to "passed a worm in stool". Works in a daycare center. Denies pain, nvd or fever.

## 2012-09-15 ENCOUNTER — Inpatient Hospital Stay: Admission: RE | Admit: 2012-09-15 | Payer: Self-pay | Source: Ambulatory Visit | Admitting: Emergency Medicine

## 2012-09-15 ENCOUNTER — Ambulatory Visit (HOSPITAL_COMMUNITY)
Admission: RE | Admit: 2012-09-15 | Discharge: 2012-09-15 | Disposition: A | Discharge status: Home / Self Care | Payer: Self-pay | Source: Ambulatory Visit | Attending: Emergency Medicine | Admitting: Emergency Medicine

## 2014-01-03 ENCOUNTER — Emergency Department (HOSPITAL_COMMUNITY)
Admission: EM | Admit: 2014-01-03 | Discharge: 2014-01-03 | Disposition: A | Discharge status: Home / Self Care | Payer: PRIVATE HEALTH INSURANCE | Attending: Emergency Medicine | Admitting: Emergency Medicine

## 2014-01-03 ENCOUNTER — Encounter (HOSPITAL_COMMUNITY): Payer: Self-pay | Admitting: Emergency Medicine

## 2014-01-03 ENCOUNTER — Emergency Department (HOSPITAL_COMMUNITY): Payer: PRIVATE HEALTH INSURANCE

## 2014-01-03 DIAGNOSIS — Y9389 Activity, other specified: Secondary | ICD-10-CM | POA: Insufficient documentation

## 2014-01-03 DIAGNOSIS — S46909A Unspecified injury of unspecified muscle, fascia and tendon at shoulder and upper arm level, unspecified arm, initial encounter: Secondary | ICD-10-CM | POA: Insufficient documentation

## 2014-01-03 DIAGNOSIS — M7532 Calcific tendinitis of left shoulder: Secondary | ICD-10-CM

## 2014-01-03 DIAGNOSIS — S4980XA Other specified injuries of shoulder and upper arm, unspecified arm, initial encounter: Secondary | ICD-10-CM | POA: Insufficient documentation

## 2014-01-03 DIAGNOSIS — Y92009 Unspecified place in unspecified non-institutional (private) residence as the place of occurrence of the external cause: Secondary | ICD-10-CM | POA: Insufficient documentation

## 2014-01-03 DIAGNOSIS — X500XXA Overexertion from strenuous movement or load, initial encounter: Secondary | ICD-10-CM | POA: Insufficient documentation

## 2014-01-03 DIAGNOSIS — M7530 Calcific tendinitis of unspecified shoulder: Secondary | ICD-10-CM | POA: Insufficient documentation

## 2014-01-03 MED ORDER — IBUPROFEN 800 MG PO TABS: 800.0000 mg | ORAL_TABLET | Freq: Three times a day (TID) | ORAL | Status: DC

## 2014-01-03 MED ORDER — HYDROCODONE-ACETAMINOPHEN 5-325 MG PO TABS: 1.0000 | ORAL_TABLET | ORAL | Status: DC | PRN

## 2014-01-03 MED ORDER — OXYCODONE-ACETAMINOPHEN 5-325 MG PO TABS
1.0000 | ORAL_TABLET | Freq: Once | ORAL | Status: DC
  Filled 2014-01-03: qty 1

## 2014-01-03 MED ORDER — OXYCODONE-ACETAMINOPHEN 5-325 MG PO TABS: 1.0000 | ORAL_TABLET | Freq: Once | ORAL | Status: DC

## 2014-01-03 MED ORDER — DIAZEPAM 5 MG PO TABS
5.0000 mg | ORAL_TABLET | Freq: Once | ORAL | Status: DC
  Filled 2014-01-03: qty 1

## 2014-01-03 NOTE — ED Provider Notes (Signed)
Medical screening examination/treatment/procedure(s) were performed by non-physician practitioner and as supervising physician I was immediately available for consultation/collaboration.   EKG Interpretation None        Charles B. Bernette MayersSheldon, MD 01/03/14 2054

## 2014-01-03 NOTE — ED Notes (Signed)
Patient transported to X-ray 

## 2014-01-03 NOTE — Discharge Instructions (Signed)
Calcific Tendinitis °Calcific tendinitis occurs when crystals of calcium are deposited in a tendon. Tendons are bands of strong, fibrous tissue that attach muscles to bones. Tendons are an important part of joints. They make the joint move and they absorb some of the stress that a joint receives during use. When calcium is deposited in the tendon, the tendon becomes stiff, painful, and it can become swollen. Calcific tendinitis occurs frequently in the shoulder joint, in a structure called the rotator cuff. °CAUSES  °The cause of calcific tendinitis is unclear. It may be associated with: °· Overuse of the tendon, such as from repetitive motion. °· Excess stress on the tendon. °· Aging. °· Repetitive, mild injuries. °SYMPTOMS  °· Pain may or may not be present. If it is present, it may occur when moving the joint. °· Tenderness when pressure is applied to the tendon. °· A snapping or popping sound when the joint moves. °· Decreased motion of the joint. °· Difficulty sleeping due to pain in the joint. °DIAGNOSIS  °Your caregiver will perform a physical exam. Imaging tests may also be used to make the diagnosis. These may include X-rays, an MRI, or a CT scan. °TREATMENT  °Generally, calcific tendinitis resolves on its own. Treatment for pain of calcific tendinitis may include: °· Taking over-the-counter medicines, such as anti-inflammatory drugs. °· Applying ice packs to the joint. °· Following a specific exercise program to keep the joint working properly. °· Attending physical therapy sessions. °· Avoiding activities that cause pain. °Treatment for more severe calcific tendinitis may require: °· Injecting cortisone steroids or pain relieving medicines into or around the joint. °· Manipulating the joint after you are given medicine to numb the area (local anesthetic). °· Inflating the joint with sterile fluid to increase the flexibility of the tendons. °· Shockwave therapy, which involves focusing sounds waves on the  joint. °If other treatments do not work, surgery may be done to clean out the calcium deposits and repair the tendons where needed. Most people do not need surgery. °HOME CARE INSTRUCTIONS  °· Only take over-the-counter or prescription medicines for pain, fever, or discomfort as directed by your caregiver. °· Follow your caregiver's recommendations for activity and exercise. °SEEK MEDICAL CARE IF: °· You notice an increase in pain or numbness. °· You develop new weakness. °· You notice increased joint stiffness or a sensation of looseness in the joint. °· You notice increasing redness, swelling, or warmth around the joint area. °SEEK IMMEDIATE MEDICAL CARE IF: °· You have a fever or persistent symptoms for more than 2 to 3 days. °· You have a fever and your symptoms suddenly get worse. °MAKE SURE YOU: °· Understand these instructions. °· Will watch your condition. °· Will get help right away if you are not doing well or get worse. °Document Released: 07/17/2008 Document Revised: 04/08/2012 Document Reviewed: 01/17/2012 °ExitCare® Patient Information ©2014 [employer]. ° °

## 2014-01-03 NOTE — ED Notes (Signed)
Pt drove self to ED. 

## 2014-01-03 NOTE — ED Notes (Signed)
Onset yesterday am pt was helping friend with yard work and was listing buckets and bags of soil, gravel and water.  Felt a pull in left shoulder and had to stop.  Pain is worsening.  Is unable to lift the left arm, was unable to use arm to put bra on today.  Has been taking IBU, last dose 400 mg @ 5am today and icing shoulder with no relief.

## 2014-01-03 NOTE — ED Notes (Signed)
Pt reports doing heavy lifting yesterday and then onset of left shoulder pain and decreased rom. +radial pulse.

## 2014-01-03 NOTE — ED Notes (Signed)
Pt ambulates without distress. Sling in place

## 2014-01-03 NOTE — ED Provider Notes (Signed)
CSN: [id number]     Arrival date & time 01/03/14  1815 History  This chart was scribed for non-physician practitioner, Fayrene Helper, PA-C working with Bonnita Levan. Bernette Mayers, MD by Greggory Stallion, ED scribe. This patient was seen in room TR11C/TR11C and the patient's care was started at 7:38 PM.   Chief Complaint  Patient presents with  . Shoulder Pain   The history is provided by the patient. No language interpreter was used.   HPI Comments: Carrie [name] is a 53 y.o. female who presents to the Emergency Department complaining of sudden onset, worsen left shoulder pain that started yesterday morning. Pt was lifting heavy buckets of soil and felt a pull in her left shoulder. Lifting her arm up worsens the pain but states she has trouble doing so. She has taken 800 mg ibuprofen with no relief. Denies elbow or wrist pain. Denies prior history of left shoulder injury or pain. Denies history of diabetes.   History reviewed. No pertinent past medical history. History reviewed. No pertinent past surgical history. History reviewed. No pertinent family history. History  Substance Use Topics  . Smoking status: Never Smoker   . Smokeless tobacco: Not on file  . Alcohol Use: No   OB History   Grav Para Term Preterm Abortions TAB SAB Ect Mult Living                 Review of Systems  Musculoskeletal: Positive for arthralgias.  All other systems reviewed and are negative.   Allergies  Review of patient's allergies indicates no known allergies.  Home Medications   Current Outpatient Rx  Name  Route  Sig  Dispense  Refill  . Multiple Vitamin (MULTIVITAMIN WITH MINERALS) TABS   Oral   Take 1 tablet by mouth daily.          BP 133/89  Pulse 100  Temp(Src) 98.3 F (36.8 C) (Oral)  Resp 18  SpO2 93%  Physical Exam  Nursing note and vitals reviewed. Constitutional: She is oriented to person, place, and time. She appears well-developed and well-nourished. No distress.  HENT:  Head:  Normocephalic and atraumatic.  Eyes: EOM are normal.  Neck: Neck supple. No tracheal deviation present.  Cardiovascular: Normal rate.   Pulmonary/Chest: Effort normal. No respiratory distress.  Musculoskeletal: Normal range of motion.  Clavicle non tender. Scapula non tender. Tenderness to the left AC joint and humeral joint. Pain along lateral aspects of the shoulder and bicep. Very limited left shoulder ROM with diffuse shoulder tenderness. No obvious deformity. No pain to elbow or wrist. Sensation intact throughout. 2+ radial pulses. Brisk capillary refill.   Neurological: She is alert and oriented to person, place, and time.  Skin: Skin is warm and dry.  Psychiatric: She has a normal mood and affect. Her behavior is normal.    ED Course  Procedures (including critical care time)  DIAGNOSTIC STUDIES: Oxygen Saturation is 93% on RA, adequate by my interpretation.    COORDINATION OF CARE: 7:42 PM-Discussed treatment plan which includes xray with pt at bedside and pt agreed to plan.   8:44 PM Xray of L shoulder demonstrates calcific tendinosis to L shoulder which explain pt's sxs.  Care instruction provided.  Ortho referral as needed.    Labs Review Labs Reviewed - No data to display Imaging Review Dg Shoulder Left  01/03/2014   CLINICAL DATA:  Pain  EXAM: LEFT SHOULDER - 2+ VIEW  COMPARISON:  February 18, 2009  FINDINGS: Frontal, Y scapular, and  axillary images were obtained. There is no fracture or dislocation. Joint spaces appear intact. There is calcification in the region of the lateral supraspinatus tendon.  IMPRESSION: Calcific tendinosis laterally.  No fracture or dislocation.   Electronically Signed   By: Bretta BangWilliam  Woodruff M.D.   On: 01/03/2014 20:29     EKG Interpretation None      MDM   Final diagnoses:  Calcific tendinitis of left shoulder    BP 133/89  Pulse 100  Temp(Src) 98.3 F (36.8 C) (Oral)  Resp 18  SpO2 93%  I have reviewed nursing notes and vital  signs. I personally reviewed the imaging tests through PACS system  I reviewed available ER/hospitalization records thought the EMR   I personally performed the services described in this documentation, which was scribed in my presence. The recorded information has been reviewed and is accurate.    Fayrene HelperBowie Franziska Podgurski, PA-C 01/03/14 2045

## 2014-01-13 ENCOUNTER — Encounter (HOSPITAL_COMMUNITY): Payer: Self-pay | Admitting: Emergency Medicine

## 2014-01-13 ENCOUNTER — Emergency Department (HOSPITAL_COMMUNITY)
Admission: EM | Admit: 2014-01-13 | Discharge: 2014-01-13 | Disposition: A | Discharge status: Home / Self Care | Payer: PRIVATE HEALTH INSURANCE | Attending: Emergency Medicine | Admitting: Emergency Medicine

## 2014-01-13 DIAGNOSIS — N390 Urinary tract infection, site not specified: Secondary | ICD-10-CM

## 2014-01-13 DIAGNOSIS — R197 Diarrhea, unspecified: Secondary | ICD-10-CM | POA: Insufficient documentation

## 2014-01-13 DIAGNOSIS — R110 Nausea: Secondary | ICD-10-CM | POA: Insufficient documentation

## 2014-01-13 DIAGNOSIS — R51 Headache: Secondary | ICD-10-CM | POA: Insufficient documentation

## 2014-01-13 DIAGNOSIS — B9789 Other viral agents as the cause of diseases classified elsewhere: Secondary | ICD-10-CM | POA: Insufficient documentation

## 2014-01-13 DIAGNOSIS — R599 Enlarged lymph nodes, unspecified: Secondary | ICD-10-CM | POA: Insufficient documentation

## 2014-01-13 DIAGNOSIS — J111 Influenza due to unidentified influenza virus with other respiratory manifestations: Secondary | ICD-10-CM

## 2014-01-13 DIAGNOSIS — R630 Anorexia: Secondary | ICD-10-CM | POA: Insufficient documentation

## 2014-01-13 DIAGNOSIS — Z3202 Encounter for pregnancy test, result negative: Secondary | ICD-10-CM | POA: Insufficient documentation

## 2014-01-13 DIAGNOSIS — J3489 Other specified disorders of nose and nasal sinuses: Secondary | ICD-10-CM | POA: Insufficient documentation

## 2014-01-13 LAB — URINALYSIS, ROUTINE W REFLEX MICROSCOPIC
GLUCOSE, UA: NEGATIVE mg/dL
Nitrite: NEGATIVE
PH: 5.5 (ref 5.0–8.0)
Protein, ur: NEGATIVE mg/dL
SPECIFIC GRAVITY, URINE: 1.028 (ref 1.005–1.030)
Urobilinogen, UA: 0.2 mg/dL (ref 0.0–1.0)

## 2014-01-13 LAB — COMPREHENSIVE METABOLIC PANEL
ALBUMIN: 4 g/dL (ref 3.5–5.2)
ALT: 29 U/L (ref 0–35)
AST: 34 U/L (ref 0–37)
Alkaline Phosphatase: 58 U/L (ref 39–117)
BILIRUBIN TOTAL: 0.4 mg/dL (ref 0.3–1.2)
BUN: 6 mg/dL (ref 6–23)
CHLORIDE: 104 meq/L (ref 96–112)
CO2: 25 mEq/L (ref 19–32)
CREATININE: 0.65 mg/dL (ref 0.50–1.10)
Calcium: 9.1 mg/dL (ref 8.4–10.5)
GFR calc Af Amer: >90 mL/min (ref >90)
GFR calc non Af Amer: >90 mL/min (ref >90)
Glucose, Bld: 96 mg/dL (ref 70–99)
POTASSIUM: 4.4 meq/L (ref 3.7–5.3)
Sodium: 141 mEq/L (ref 137–147)
Total Protein: 7.1 g/dL (ref 6.0–8.3)

## 2014-01-13 LAB — CBC WITH DIFFERENTIAL/PLATELET
BASOS ABS: 0 10*3/uL (ref 0.0–0.1)
BASOS PCT: 0 % (ref 0–1)
Eosinophils Absolute: 0 10*3/uL (ref 0.0–0.7)
Eosinophils Relative: 0 % (ref 0–5)
HCT: 41.4 % (ref 36.0–46.0)
Hemoglobin: 14.2 g/dL (ref 12.0–15.0)
Lymphocytes Relative: 34 % (ref 12–46)
Lymphs Abs: 1 10*3/uL (ref 0.7–4.0)
MCH: 30 pg (ref 26.0–34.0)
MCHC: 34.3 g/dL (ref 30.0–36.0)
MCV: 87.3 fL (ref 78.0–100.0)
MONO ABS: 0.4 10*3/uL (ref 0.1–1.0)
Monocytes Relative: 12 % (ref 3–12)
NEUTROS ABS: 1.6 10*3/uL — AB (ref 1.7–7.7)
NEUTROS PCT: 53 % (ref 43–77)
Platelets: 150 10*3/uL (ref 150–400)
RBC: 4.74 MIL/uL (ref 3.87–5.11)
RDW: 13.5 % (ref 11.5–15.5)
WBC: 3 10*3/uL — ABNORMAL LOW (ref 4.0–10.5)

## 2014-01-13 LAB — URINE MICROSCOPIC-ADD ON

## 2014-01-13 LAB — POC URINE PREG, ED: Preg Test, Ur: NEGATIVE

## 2014-01-13 LAB — LIPASE, BLOOD: Lipase: 26 U/L (ref 11–59)

## 2014-01-13 MED ORDER — MORPHINE SULFATE 4 MG/ML IJ SOLN: 4.0000 mg | Freq: Once | INTRAMUSCULAR | Status: DC

## 2014-01-13 MED ORDER — OSELTAMIVIR PHOSPHATE 75 MG PO CAPS: 75.0000 mg | ORAL_CAPSULE | Freq: Two times a day (BID) | ORAL | Status: DC

## 2014-01-13 MED ORDER — PROMETHAZINE HCL 25 MG PO TABS
25.0000 mg | ORAL_TABLET | Freq: Once | ORAL | Status: AC
  Administered 2014-01-13: 25 mg via ORAL

## 2014-01-13 MED ORDER — PROMETHAZINE HCL 25 MG PO TABS: 25.0000 mg | ORAL_TABLET | Freq: Four times a day (QID) | ORAL | Status: DC | PRN

## 2014-01-13 MED ORDER — PROMETHAZINE HCL 25 MG/ML IJ SOLN
25.0000 mg | Freq: Once | INTRAMUSCULAR | Status: DC
  Filled 2014-01-13 (×2): qty 1

## 2014-01-13 MED ORDER — CEPHALEXIN 500 MG PO CAPS: ORAL_CAPSULE | ORAL | Status: DC

## 2014-01-13 MED ORDER — ONDANSETRON HCL 4 MG/2ML IJ SOLN
4.0000 mg | Freq: Once | INTRAMUSCULAR | Status: AC
  Administered 2014-01-13: 4 mg via INTRAVENOUS
  Filled 2014-01-13: qty 2

## 2014-01-13 MED ORDER — SODIUM CHLORIDE 0.9 % IV BOLUS (SEPSIS)
1000.0000 mL | Freq: Once | INTRAVENOUS | Status: AC
  Administered 2014-01-13: 1000 mL via INTRAVENOUS

## 2014-01-13 MED ORDER — PROMETHAZINE HCL 25 MG PO TABS
25.0000 mg | ORAL_TABLET | Freq: Once | ORAL | Status: DC
  Filled 2014-01-13: qty 1

## 2014-01-13 NOTE — Discharge Instructions (Signed)
Influenza, Adult Influenza (flu) is an infection in the mouth, nose, and throat (respiratory tract) caused by a virus. The flu can make you feel very ill. Influenza spreads easily from person to person (contagious).  HOME CARE   Only take medicines as told by your doctor.  Use a cool mist humidifier to make breathing easier.  Get plenty of rest until your fever goes away. This usually takes 3 to 4 days.  Drink enough fluids to keep your pee (urine) clear or pale yellow.  Cover your mouth and nose when you cough or sneeze.  Wash your hands well to avoid spreading the flu.  Stay home from work or school until your fever has been gone for at least 1 full day.  Get a flu shot every year. GET HELP RIGHT AWAY IF:   You have trouble breathing or feel short of breath.  Your skin or nails turn blue.  You have severe neck pain or stiffness.  You have a severe headache, facial pain, or earache.  Your fever gets worse or keeps coming back.  You feel sick to your stomach (nauseous), throw up (vomit), or have watery poop (diarrhea).  You have chest pain.  You have a deep cough that gets worse, or you cough up more thick spit (mucus). MAKE SURE YOU:   Understand these instructions.  Will watch your condition.  Will get help right away if you are not doing well or get worse. Document Released: 07/17/2008 Document Revised: 04/08/2012 Document Reviewed: 01/07/2012 [facility] HospitalExitCare Patient Information 2014 [place], [place].  Urinary Tract Infection A urinary tract infection (UTI) can occur any place along the urinary tract. The tract includes the kidneys, ureters, bladder, and urethra. A type of germ called bacteria often causes a UTI. UTIs are often helped with antibiotic medicine.  HOME CARE   If given, take antibiotics as told by your doctor. Finish them even if you start to feel better.  Drink enough fluids to keep your pee (urine) clear or pale yellow.  Avoid tea, drinks with caffeine,  and bubbly (carbonated) drinks.  Pee often. Avoid holding your pee in for a long time.  Pee before and after having sex (intercourse).  Wipe from front to back after you poop (bowel movement) if you are a woman. Use each tissue only once. GET HELP RIGHT AWAY IF:   You have back pain.  You have lower belly (abdominal) pain.  You have chills.  You feel sick to your stomach (nauseous).  You throw up (vomit).  Your burning or discomfort with peeing does not go away.  You have a fever.  Your symptoms are not better in 3 days. MAKE SURE YOU:   Understand these instructions.  Will watch your condition.  Will get help right away if you are not doing well or get worse. Document Released: 03/26/2008 Document Revised: 07/02/2012 Document Reviewed: 05/08/2012 [facility]sExitCare Patient Information 2014 [place]ExitCare, [place]LL[place].

## 2014-01-13 NOTE — ED Notes (Signed)
Patient is unable to give an urine specimen at this time. The patient has been advised to use the call light for assistance to use restroom. The tech has reported to the RN in charge.

## 2014-01-13 NOTE — ED Notes (Signed)
Pt reports generalized body aches since last night. This morning nausea and diarrhea. Reports chills. Pt is a x 4. Also c/o headache. States took onion juice to kill virus but has not helped.

## 2014-01-13 NOTE — ED Provider Notes (Signed)
CSN: [id number]     Arrival date & time 01/13/14  1450 History   First MD Initiated Contact with Patient 01/13/14 1925     Chief Complaint  Patient presents with  . Generalized Body Aches  . Nausea  . Diarrhea     (Consider location/radiation/quality/duration/timing/severity/associated sxs/prior Treatment) HPI Comments: Patient is an otherwise healthy 53 year old female who presents today with generalized malaise, nausea, diarrhea, headache. She had sudden onset of her symptoms yesterday and she feels as though she has gradually worsened. She reports that she had the flu several years ago and this feels like that. She describes her headache as a gradually worsening "sick headache". It is mostly frontal. No blurry vision or double vision. She feels as though she has had a fever, but has not measured it. The only thing she has done to feel better is take onion juice which did not improve her symptoms. She denies vomiting, abdominal pain, chest pain, shortness of breath.   The history is provided by the patient. No language interpreter was used.    History reviewed. No pertinent past medical history. History reviewed. No pertinent past surgical history. No family history on file. History  Substance Use Topics  . Smoking status: Never Smoker   . Smokeless tobacco: Not on file  . Alcohol Use: No   OB History   Grav Para Term Preterm Abortions TAB SAB Ect Mult Living                 Review of Systems  Constitutional: Positive for fever, chills and appetite change.  HENT: Positive for congestion.   Respiratory: Negative for shortness of breath.   Cardiovascular: Negative for chest pain.  Gastrointestinal: Positive for nausea and diarrhea. Negative for vomiting and abdominal pain.  Musculoskeletal: Positive for myalgias.  All other systems reviewed and are negative.      Allergies  Gluten meal; Lactose intolerance (gi); Other; and Wheat bran  Home Medications   Current  Outpatient Rx  Name  Route  Sig  Dispense  Refill  . Multiple Vitamin (MULTIVITAMIN WITH MINERALS) TABS   Oral   Take 1 tablet by mouth daily.         Marland Kitchen. OVER THE COUNTER MEDICATION   Oral   Take 1 capsule by mouth daily. Haw buck thorn         . Probiotic Product (ALIGN) 4 MG CAPS   Oral   Take 4 mg by mouth daily.          BP 121/66  Pulse 77  Temp(Src) 98.6 F (37 C) (Oral)  Resp 16  SpO2 98% Physical Exam  Nursing note and vitals reviewed. Constitutional: She is oriented to person, place, and time. She appears well-developed and well-nourished.  Non-toxic appearance. She does not have a sickly appearance. She appears ill. No distress.  HENT:  Head: Normocephalic and atraumatic.  Right Ear: External ear normal.  Left Ear: External ear normal.  Nose: Right sinus exhibits maxillary sinus tenderness and frontal sinus tenderness. Left sinus exhibits maxillary sinus tenderness and frontal sinus tenderness.  Mouth/Throat: Oropharynx is clear and moist.  No temporal artery tenderness  Eyes: Conjunctivae and EOM are normal. Pupils are equal, round, and reactive to light.  Neck: Normal range of motion. No spinous process tenderness and no muscular tenderness present. No rigidity.  No nuchal rigidity or meningeal signs  Cardiovascular: Normal rate, regular rhythm, normal heart sounds, intact distal pulses and normal pulses.   Pulses:  Radial pulses are 2+ on the right side, and 2+ on the left side.       Posterior tibial pulses are 2+ on the right side, and 2+ on the left side.  Pulmonary/Chest: Effort normal and breath sounds normal. No stridor. No respiratory distress. She has no wheezes. She has no rales.  Abdominal: Soft. She exhibits no distension. There is no tenderness. There is no rigidity, no rebound and no guarding.  Musculoskeletal: Normal range of motion.  Lymphadenopathy:    She has cervical adenopathy.  Neurological: She is alert and oriented to person,  place, and time. She has normal strength. Coordination and gait normal.  Finger nose finger normal  Skin: Skin is warm and dry. She is not diaphoretic. No erythema.  Psychiatric: She has a normal mood and affect. Her behavior is normal.    ED Course  Procedures (including critical care time) Labs Review Labs Reviewed  CBC WITH DIFFERENTIAL - Abnormal; Notable for the following:    WBC 3.0 (*)    Neutro Abs 1.6 (*)    All other components within normal limits  URINALYSIS, ROUTINE W REFLEX MICROSCOPIC - Abnormal; Notable for the following:    Hgb urine dipstick TRACE (*)    Bilirubin Urine SMALL (*)    Ketones, ur >80 (*)    Leukocytes, UA SMALL (*)    All other components within normal limits  COMPREHENSIVE METABOLIC PANEL  LIPASE, BLOOD  URINE MICROSCOPIC-ADD ON  POC URINE PREG, ED   Imaging Review No results found.   EKG Interpretation None      MDM   Final diagnoses:  Influenza  UTI (lower urinary tract infection)    Patient presents to ED with generalized malaise and headache since yesterday. Associated nausea and diarrhea. Labs are unremarkable. Will treat now with zofran, morphine, and aggressive fluid rehydration.   Patient feels significantly improved after intervention. Abd remains soft and non surgical. Patient dx with UTI. Will discharge home with Keflex and Tamiflu. Patient agreeable to plan. Return instructions given. Vital signs stable for discharge. Discussed case with Dr. Romeo Apple who agrees with plan. Patient / Family / Caregiver informed of clinical course, understand medical decision-making process, and agree with plan.   Mora Bellman, PA-C 01/14/14 [phone]

## 2014-01-15 NOTE — ED Provider Notes (Signed)
Medical screening examination/treatment/procedure(s) were performed by non-physician practitioner and as supervising physician I was immediately available for consultation/collaboration.   EKG Interpretation None        Junius ArgyleForrest S Sohaib Vereen, MD 01/15/14 1153

## 2014-11-27 ENCOUNTER — Emergency Department (HOSPITAL_COMMUNITY)
Admission: EM | Admit: 2014-11-27 | Discharge: 2014-11-27 | Disposition: A | Discharge status: Home / Self Care | Payer: No Typology Code available for payment source | Attending: Emergency Medicine | Admitting: Emergency Medicine

## 2014-11-27 ENCOUNTER — Encounter (HOSPITAL_COMMUNITY): Payer: Self-pay | Admitting: Physical Medicine and Rehabilitation

## 2014-11-27 DIAGNOSIS — H6123 Impacted cerumen, bilateral: Secondary | ICD-10-CM | POA: Diagnosis not present

## 2014-11-27 DIAGNOSIS — H9203 Otalgia, bilateral: Secondary | ICD-10-CM | POA: Diagnosis present

## 2014-11-27 DIAGNOSIS — R220 Localized swelling, mass and lump, head: Secondary | ICD-10-CM | POA: Diagnosis not present

## 2014-11-27 DIAGNOSIS — R21 Rash and other nonspecific skin eruption: Secondary | ICD-10-CM | POA: Diagnosis not present

## 2014-11-27 DIAGNOSIS — R0981 Nasal congestion: Secondary | ICD-10-CM | POA: Diagnosis not present

## 2014-11-27 DIAGNOSIS — Z792 Long term (current) use of antibiotics: Secondary | ICD-10-CM | POA: Insufficient documentation

## 2014-11-27 DIAGNOSIS — Z79899 Other long term (current) drug therapy: Secondary | ICD-10-CM | POA: Diagnosis not present

## 2014-11-27 MED ORDER — NAPROXEN 500 MG PO TABS: 500.0000 mg | ORAL_TABLET | Freq: Two times a day (BID) | ORAL | Status: DC

## 2014-11-27 NOTE — ED Notes (Signed)
Pt presents to department for evaluation of bilateral ear pain. Ongoing x1 week. No relief with medications at home. Pt is alert and oriented x4.

## 2014-11-27 NOTE — ED Provider Notes (Signed)
CSN: [id number]     Arrival date & time 11/27/14  1841 History  This chart was scribed for Joycie PeekBenjamin Harlea Goetzinger, PA-C working with No att. providers found by Elveria Risingimelie Horne, ED Scribe. This patient was seen in room TR10C/TR10C and the patient's care was started at 7:16 PM.   Chief Complaint  Patient presents with  . Otalgia   The history is provided by the patient. No language interpreter was used.   HPI Comments: Carrie [name] is a 54 y.o. female who presents to the Emergency Department complaining of worsening bilateral ear pain for one week. Patient reports treatment with Tylenol, but reports that today the medication hasn't touched the pain. Patient reports her worse pain today. Patient reports attempted treatment with home remedies to no avail. Patient reports intermittent sharp shooting ear pain and "stuffiness" that is greater in her left ear than right. Patient reports muffled hearing, resolved headache, and minimal wax like drainage. Patient denies jaw pain, dizziness, changes in vision, abdominal pain, nausea, vomiting.   History reviewed. No pertinent past medical history. History reviewed. No pertinent past surgical history. No family history on file. History  Substance Use Topics  . Smoking status: Never Smoker   . Smokeless tobacco: Not on file  . Alcohol Use: No   OB History    No data available     Review of Systems  Constitutional: Negative for fever and chills.  HENT: Positive for congestion, ear discharge, ear pain and facial swelling. Negative for hearing loss.   Eyes: Negative for visual disturbance.  Gastrointestinal: Negative for nausea, vomiting and abdominal pain.  Neurological: Positive for headaches.   Allergies  Gluten meal; Lactose intolerance (gi); Other; and Wheat bran  Home Medications   Prior to Admission medications   Medication Sig Start Date End Date Taking? Authorizing Provider  cephALEXin (KEFLEX) 500 MG capsule 2 caps po bid x 7 days 01/13/14    Mora BellmanHannah S Merrell, PA-C  Multiple Vitamin (MULTIVITAMIN WITH MINERALS) TABS Take 1 tablet by mouth daily.    Historical Provider, MD  naproxen (NAPROSYN) 500 MG tablet Take 1 tablet (500 mg total) by mouth 2 (two) times daily. 11/27/14   Sharlene MottsBenjamin W Trennon Torbeck, PA-C  oseltamivir (TAMIFLU) 75 MG capsule Take 1 capsule (75 mg total) by mouth every 12 (twelve) hours. 01/13/14   Mora BellmanHannah S Merrell, PA-C  OVER THE COUNTER MEDICATION Take 1 capsule by mouth daily. Haw buck thorn    Historical Provider, MD  Probiotic Product (ALIGN) 4 MG CAPS Take 4 mg by mouth daily.    Historical Provider, MD  promethazine (PHENERGAN) 25 MG tablet Take 1 tablet (25 mg total) by mouth every 6 (six) hours as needed for nausea or vomiting. 01/13/14   Mora BellmanHannah S Merrell, PA-C   Triage Vitals: BP 135/86 mmHg  Pulse 89  Temp(Src) 97.5 F (36.4 C) (Oral)  Resp 18  SpO2 96% Physical Exam  Constitutional: She is oriented to person, place, and time. She appears well-developed and well-nourished. No distress.  HENT:  Head: Normocephalic and atraumatic.  Mouth/Throat: Oropharynx is clear and moist.  Cerumen impaction bilaterally. No evidence of otis externa. Oropharynx is clear and moist. No pre or post auricular lymphadenopathy, no posterior chain lymphadenopathy. No tenderness to mastoids. No other lesions or deformities noted. No trismus or glossal elevation  Eyes: EOM are normal. Pupils are equal, round, and reactive to light.  EOM intact. No nystagmus.   Neck: Neck supple. No tracheal deviation present.  Cardiovascular: Normal rate, regular  rhythm and normal heart sounds.   Pulmonary/Chest: Effort normal and breath sounds normal. No respiratory distress.  Musculoskeletal: Normal range of motion.  Neurological: She is alert and oriented to person, place, and time.  Cranial nerves II through XII grossly intact. Moves all 4 extremities without ataxia. Extraocular movements intact without nystagmus. Gait is baseline without ataxia   Skin: Skin is warm and dry.  Psychiatric: She has a normal mood and affect. Her behavior is normal.  Nursing note and vitals reviewed.   ED Course  Procedures (including critical care time)  COORDINATION OF CARE: 7:28 PM- Plans to irrigate ears for better examination. Discussed treatment plan with patient at bedside and patient agreed to plan.   Labs Review Labs Reviewed - No data to display  Imaging Review No results found.   EKG Interpretation None     Meds given in ED:  Medications - No data to display  Discharge Medication List as of 11/27/2014 10:10 PM    START taking these medications   Details  naproxen (NAPROSYN) 500 MG tablet Take 1 tablet (500 mg total) by mouth 2 (two) times daily., Starting 11/27/2014, Until Discontinued, Print       Filed Vitals:   11/27/14 1855 11/27/14 2216  BP: 135/86 134/86  Pulse: 89 80  Temp: 97.5 F (36.4 C)   TempSrc: Oral   Resp: 18 16  SpO2: 96% 97%    MDM  Vitals stable - WNL -afebrile Pt resting comfortably in ED. Reports her symptoms have resolved after bilateral ear irrigation. PE--upon reevaluation after bilateral ear irrigation, TMs are clear bilaterally with no evidence of infection. Normal neuro exam  DDX--patient with cerumen impaction. No evidence of otitis externa, media, interna. No dizziness. Low concern for labyrinthitis, Mnire's disease, mastoiditis. Low concern for other neurologic pathology. Discussed taking anti-inflammatories to help with any discomfort or irritation in her ear canal and following up with her PCP.  I discussed all relevant lab findings and imaging results with pt and they verbalized understanding. Discussed f/u with PCP within 48 hrs and return precautions, pt very amenable to plan.  Prior to patient discharge, I discussed and reviewed this case with Dr. Donnald Garre, who also saw and evaluated the patient.  Final diagnoses:  Cerumen impaction, bilateral    I personally performed the  services described in this documentation, which was scribed in my presence. The recorded information has been reviewed and is accurate.    Earle Gell Marienthal, PA-C 11/28/14 0131  Arby Barrette, MD 11/28/14 1520

## 2014-11-27 NOTE — Discharge Instructions (Signed)
Cerumen Impaction A cerumen impaction is when the wax in your ear forms a plug. This plug usually causes reduced hearing. Sometimes it also causes an earache or dizziness. Removing a cerumen impaction can be difficult and painful. The wax sticks to the ear canal. The canal is sensitive and bleeds easily. If you try to remove a heavy wax buildup with a cotton tipped swab, you may push it in further. Irrigation with water, suction, and small ear curettes may be used to clear out the wax. If the impaction is fixed to the skin in the ear canal, ear drops may be needed for a few days to loosen the wax. People who build up a lot of wax frequently can use ear wax removal products available in your local drugstore. SEEK MEDICAL CARE IF:  You develop an earache, increased hearing loss, or marked dizziness. Document Released: 11/15/2004 Document Revised: 12/31/2011 Document Reviewed: 01/05/2010 [employer] Patient Information 2015 [place]. This information is not intended to replace advice given to you by your health care provider. Make sure you discuss any questions you have with your health care provider.  You were evaluated in the ED today for your ear discomfort. You were found to have a earwax impaction. He reported feeling much better after you had both ears irrigated in the earwax removed. It is important for you to continue to take your anti-inflammatories with any irritation or discomfort you may experience. It is also important to follow-up with primary care for further evaluation and management of your symptoms. Return to ED for new or worsening symptoms.

## 2014-11-27 NOTE — ED Notes (Signed)
MD at bedside. 

## 2014-11-27 NOTE — ED Notes (Signed)
PT requesting to see MD, MD and PA informed.

## 2015-01-23 ENCOUNTER — Encounter (HOSPITAL_COMMUNITY): Payer: Self-pay

## 2015-01-23 ENCOUNTER — Emergency Department (HOSPITAL_COMMUNITY)
Admission: EM | Admit: 2015-01-23 | Discharge: 2015-01-23 | Disposition: A | Discharge status: Home / Self Care | Payer: No Typology Code available for payment source | Attending: Emergency Medicine | Admitting: Emergency Medicine

## 2015-01-23 DIAGNOSIS — Z79899 Other long term (current) drug therapy: Secondary | ICD-10-CM | POA: Insufficient documentation

## 2015-01-23 DIAGNOSIS — L02211 Cutaneous abscess of abdominal wall: Secondary | ICD-10-CM | POA: Diagnosis not present

## 2015-01-23 DIAGNOSIS — Z791 Long term (current) use of non-steroidal anti-inflammatories (NSAID): Secondary | ICD-10-CM | POA: Insufficient documentation

## 2015-01-23 MED ORDER — HYDROCODONE-ACETAMINOPHEN 5-325 MG PO TABS: 1.0000 | ORAL_TABLET | Freq: Four times a day (QID) | ORAL | Status: DC | PRN

## 2015-01-23 MED ORDER — IBUPROFEN 600 MG PO TABS: 600.0000 mg | ORAL_TABLET | Freq: Four times a day (QID) | ORAL | Status: DC | PRN

## 2015-01-23 MED ORDER — LIDOCAINE-EPINEPHRINE (PF) 2 %-1:200000 IJ SOLN
10.0000 mL | Freq: Once | INTRAMUSCULAR | Status: AC
  Administered 2015-01-23: 10 mL
  Filled 2015-01-23: qty 20

## 2015-01-23 NOTE — ED Notes (Signed)
Onset 2 weeks painful red bump on right side of abdomen.  No drainage or fevers.

## 2015-01-23 NOTE — Discharge Instructions (Signed)

## 2015-01-23 NOTE — ED Provider Notes (Signed)
CSN: [id number]     Arrival date & time 01/23/15  2002 History  This chart was scribed for non-physician practitioner, Junius Finner, PA-C working with Jerelyn Scott, MD by Greggory Stallion, ED scribe. This patient was seen in room TR11C/TR11C and the patient's care was started at 8:45 PM.    Chief Complaint  Patient presents with  . Abscess   The history is provided by the patient. No language interpreter was used.    HPI Comments: Carrie [name] is a 54 y.o. female who presents to the Emergency Department complaining of an abscess to her right abdomen that started 2 weeks ago. Pt reports worsening burning pain, swelling and redness to the area. She is unsure if she might have been bitten by something. Pain is aching, 3/10.  Pt denies drainage. She has used rubbing alcohol and neosporin with no relief. Pt denies fever, nausea, emesis. She denies history of abscesses.   History reviewed. No pertinent past medical history. History reviewed. No pertinent past surgical history. History reviewed. No pertinent family history. History  Substance Use Topics  . Smoking status: Never Smoker   . Smokeless tobacco: Not on file  . Alcohol Use: No   OB History    No data available     Review of Systems  Constitutional: Negative for fever.  Gastrointestinal: Negative for nausea and vomiting.  Skin: Positive for color change.       Abscess  All other systems reviewed and are negative.  Allergies  Gluten meal; Lactose intolerance (gi); Other; and Wheat bran  Home Medications   Prior to Admission medications   Medication Sig Start Date End Date Taking? Authorizing Provider  cephALEXin (KEFLEX) 500 MG capsule 2 caps po bid x 7 days 01/13/14   Junious Silk, PA-C  HYDROcodone-acetaminophen (NORCO/VICODIN) 5-325 MG per tablet Take 1-2 tablets by mouth every 6 (six) hours as needed. 01/23/15   Junius Finner, PA-C  ibuprofen (ADVIL,MOTRIN) 600 MG tablet Take 1 tablet (600 mg total) by mouth every 6  (six) hours as needed. 01/23/15   Junius Finner, PA-C  Multiple Vitamin (MULTIVITAMIN WITH MINERALS) TABS Take 1 tablet by mouth daily.    Historical Provider, MD  naproxen (NAPROSYN) 500 MG tablet Take 1 tablet (500 mg total) by mouth 2 (two) times daily. 11/27/14   Joycie Peek, PA-C  oseltamivir (TAMIFLU) 75 MG capsule Take 1 capsule (75 mg total) by mouth every 12 (twelve) hours. 01/13/14   Junious Silk, PA-C  OVER THE COUNTER MEDICATION Take 1 capsule by mouth daily. Haw buck thorn    Historical Provider, MD  Probiotic Product (ALIGN) 4 MG CAPS Take 4 mg by mouth daily.    Historical Provider, MD  promethazine (PHENERGAN) 25 MG tablet Take 1 tablet (25 mg total) by mouth every 6 (six) hours as needed for nausea or vomiting. 01/13/14   Junious Silk, PA-C   BP 121/83 mmHg  Pulse 97  Temp(Src) 97.4 F (36.3 C) (Oral)  Resp 18  Ht  (1.676 m)  Wt 189 lb (85.73 kg)  BMI 30.52 kg/m2  SpO2 99%  LMP 01/22/2014   Physical Exam  Constitutional: She is oriented to person, place, and time. She appears well-developed and well-nourished.  HENT:  Head: Normocephalic and atraumatic.  Eyes: EOM are normal.  Neck: Normal range of motion.  Cardiovascular: Normal rate.   Pulmonary/Chest: Effort normal.  Musculoskeletal: Normal range of motion.  Neurological: She is alert and oriented to person, place, and time.  Skin: Skin  is warm and dry.  2 cm circular, erythematous lesion to RUQ of abdomen. Tender to touch with fluctuance. No induration. No active bleeding or drainage. No red streaking.   Psychiatric: She has a normal mood and affect. Her behavior is normal.  Nursing note and vitals reviewed.   ED Course  Procedures (including critical care time)  INCISION AND DRAINAGE Performed by: Junius FinnerErin O'Malley, PA-C Consent: Verbal consent obtained. Risks and benefits: risks, benefits and alternatives were discussed Type: abscess  Body area: RUQ of abdomen  Anesthesia: local  infiltration  Incision was made with a scalpel.  Local anesthetic: lidocaine 2% with epinephrine  Anesthetic total: 0.5 ml  Complexity: complex Blunt dissection to break up loculations  Drainage: purulent, bloody  Drainage amount: moderate  Packing material: 1/4 in iodoform gauze  Patient tolerance: Patient tolerated the procedure well with no immediate complications.   DIAGNOSTIC STUDIES: Oxygen Saturation is 96% on RA, normal by my interpretation.    COORDINATION OF CARE: 8:46 PM-Discussed treatment plan which includes I&D with pt at bedside and pt agreed to plan.   Labs Review Labs Reviewed - No data to display  Imaging Review No results found.   EKG Interpretation None      MDM   Final diagnoses:  Cutaneous abscess of abdominal wall    Pt presenting to ED with cutaneous abscess on her abdomen.  I&D successfully performed. Packing placed. Advised to f/u in 2-3 days for wound recheck. Home care instructions provided. Return precautions provided. Pt verbalized understanding and agreement with tx plan.   I personally performed the services described in this documentation, which was scribed in my presence. The recorded information has been reviewed and is accurate.   Junius Finnerrin O'Malley, PA-C 01/24/15 0134  Jerelyn ScottMartha Linker, MD 01/26/15 [phone]

## 2015-05-30 ENCOUNTER — Encounter (HOSPITAL_COMMUNITY): Payer: Self-pay | Admitting: Emergency Medicine

## 2015-05-30 ENCOUNTER — Emergency Department (HOSPITAL_COMMUNITY)
Admission: EM | Admit: 2015-05-30 | Discharge: 2015-05-31 | Disposition: A | Discharge status: Home / Self Care | Payer: PRIVATE HEALTH INSURANCE | Attending: Emergency Medicine | Admitting: Emergency Medicine

## 2015-05-30 DIAGNOSIS — Z791 Long term (current) use of non-steroidal anti-inflammatories (NSAID): Secondary | ICD-10-CM | POA: Insufficient documentation

## 2015-05-30 DIAGNOSIS — Z79899 Other long term (current) drug therapy: Secondary | ICD-10-CM | POA: Insufficient documentation

## 2015-05-30 DIAGNOSIS — R51 Headache: Secondary | ICD-10-CM | POA: Insufficient documentation

## 2015-05-30 DIAGNOSIS — R519 Headache, unspecified: Secondary | ICD-10-CM

## 2015-05-30 NOTE — ED Provider Notes (Signed)
CSN: [id number]     Arrival date & time 05/30/15  2222 History   First MD Initiated Contact with Patient 05/30/15 2326     Chief Complaint  Patient presents with  . Facial Pain     (Consider location/radiation/quality/duration/timing/severity/associated sxs/prior Treatment) HPI Comments: Patient is a 54 year old female who presents with headache and sinus pressure that started about 2 weeks ago. Patient reports her apartment complex has been spraying pesticides and insecticides outside of her apartment which she believes have triggered her headaches. She reports other residents have had the same symptoms. No alleviating factors. Patient requesting a note stating that they cannot spray near her apartment.   Patient is a 54 y.o. female presenting with headaches. The history is provided by the patient. No language interpreter was used.  Headache Pain location:  Frontal Quality:  Dull Radiates to:  Does not radiate Severity currently:  10/10 Severity at highest:  10/10 Onset quality:  Gradual Duration:  2 weeks Timing:  Constant Progression:  Unchanged Chronicity:  New Context: not activity, not exposure to bright light, not caffeine, not defecating and not intercourse   Relieved by:  Nothing Worsened by:  Nothing Ineffective treatments:  None tried Associated symptoms: sinus pressure     History reviewed. No pertinent past medical history. History reviewed. No pertinent past surgical history. No family history on file. History  Substance Use Topics  . Smoking status: Never Smoker   . Smokeless tobacco: Not on file  . Alcohol Use: No   OB History    No data available     Review of Systems  HENT: Positive for sinus pressure.   Neurological: Positive for headaches.  All other systems reviewed and are negative.     Allergies  Gluten meal; Lactose intolerance (gi); Other; and Wheat bran  Home Medications   Prior to Admission medications   Medication Sig Start Date End  Date Taking? Authorizing Provider  cephALEXin (KEFLEX) 500 MG capsule 2 caps po bid x 7 days 01/13/14   Junious Silk, PA-C  HYDROcodone-acetaminophen (NORCO/VICODIN) 5-325 MG per tablet Take 1-2 tablets by mouth every 6 (six) hours as needed. 01/23/15   Junius Finner, PA-C  ibuprofen (ADVIL,MOTRIN) 600 MG tablet Take 1 tablet (600 mg total) by mouth every 6 (six) hours as needed. 01/23/15   Junius Finner, PA-C  Multiple Vitamin (MULTIVITAMIN WITH MINERALS) TABS Take 1 tablet by mouth daily.    Historical Provider, MD  naproxen (NAPROSYN) 500 MG tablet Take 1 tablet (500 mg total) by mouth 2 (two) times daily. 11/27/14   Joycie Peek, PA-C  oseltamivir (TAMIFLU) 75 MG capsule Take 1 capsule (75 mg total) by mouth every 12 (twelve) hours. 01/13/14   Junious Silk, PA-C  OVER THE COUNTER MEDICATION Take 1 capsule by mouth daily. Haw buck thorn    Historical Provider, MD  Probiotic Product (ALIGN) 4 MG CAPS Take 4 mg by mouth daily.    Historical Provider, MD  promethazine (PHENERGAN) 25 MG tablet Take 1 tablet (25 mg total) by mouth every 6 (six) hours as needed for nausea or vomiting. 01/13/14   Junious Silk, PA-C   BP 128/87 mmHg  Pulse 82  Temp(Src) 97.8 F (36.6 C) (Oral)  Resp 22  Ht 5\' 6"  (1.676 m)  Wt 189 lb 8 oz (85.957 kg)  BMI 30.60 kg/m2  SpO2 97% Physical Exam  Constitutional: She is oriented to person, place, and time. She appears well-developed and well-nourished. No distress.  HENT:  Head: Normocephalic  and atraumatic.  Eyes: Conjunctivae and EOM are normal.  Neck: Normal range of motion.  Cardiovascular: Normal rate and regular rhythm.  Exam reveals no gallop and no friction rub.   No murmur heard. Pulmonary/Chest: Effort normal and breath sounds normal. She has no wheezes. She has no rales. She exhibits no tenderness.  Abdominal: Soft. There is no tenderness.  Musculoskeletal: Normal range of motion.  Neurological: She is alert and oriented to person, place, and time.  Coordination normal.  Speech is goal-oriented. Moves limbs without ataxia.   Skin: Skin is warm and dry.  Psychiatric: She has a normal mood and affect. Her behavior is normal.  Nursing note and vitals reviewed.   ED Course  Procedures (including critical care time) Labs Review Labs Reviewed - No data to display  Imaging Review No results found.   EKG Interpretation None      MDM   Final diagnoses:  Acute nonintractable headache, unspecified headache type    11:51 PM Patient requesting a note for her apartment complex to stop spraying insecticide and pesticides inside and outside of her apartment.     Emilia Beck, PA-C 05/31/15 1610  Gilda Crease, MD 05/31/15 [phone]

## 2015-05-30 NOTE — ED Notes (Signed)
Pt reports that for the past 2 weeks she has had sinus pressure, headache and post nasal drip.

## 2015-05-31 NOTE — ED Notes (Signed)
Pt stable, ambulatory, states understanding of discharge instructions 

## 2015-10-27 ENCOUNTER — Encounter: Payer: PRIVATE HEALTH INSURANCE | Admitting: Family Medicine

## 2016-05-08 ENCOUNTER — Encounter (HOSPITAL_COMMUNITY): Payer: Self-pay | Admitting: Emergency Medicine

## 2016-05-08 ENCOUNTER — Emergency Department (HOSPITAL_COMMUNITY)
Admission: EM | Admit: 2016-05-08 | Discharge: 2016-05-08 | Disposition: A | Discharge status: Home / Self Care | Payer: PRIVATE HEALTH INSURANCE | Attending: Emergency Medicine | Admitting: Emergency Medicine

## 2016-05-08 DIAGNOSIS — H6091 Unspecified otitis externa, right ear: Secondary | ICD-10-CM | POA: Insufficient documentation

## 2016-05-08 MED ORDER — CIPROFLOXACIN-DEXAMETHASONE 0.3-0.1 % OT SUSP
4.0000 [drp] | Freq: Two times a day (BID) | OTIC | Status: AC
  Administered 2016-05-08: 4 [drp] via OTIC
  Filled 2016-05-08: qty 7.5

## 2016-05-08 NOTE — ED Provider Notes (Signed)
CSN: [id number]     Arrival date & time 05/08/16  1015 History  By signing my name below, I, Freida Busmaniana Omoyeni, attest that this documentation has been prepared under the direction and in the presence of non-physician practitioner, Everlene FarrierWilliam Jozlyn Schatz, PA-C. Electronically Signed: Freida Busmaniana Omoyeni, Scribe. 05/08/2016. 11:18 AM.    Chief Complaint  Patient presents with  . Otalgia   The history is provided by the patient. No language interpreter was used.     HPI Comments:  Carrie [name] is a 55 y.o. female who presents to the Emergency Department complaining of intermittent, shooting pain in her right ear x 4 days. She notes her pain worsened today. She reports h/o similar pain when diagnosed with an ear infection in the past. She reports some slight nasal congestion that has resolved. Pt has taken tylenol with little relief. Pt denies hearing loss, ear discharge or fever. She also denies recently swimming.    History reviewed. No pertinent past medical history. History reviewed. No pertinent past surgical history. No family history on file. Social History  Substance Use Topics  . Smoking status: Never Smoker   . Smokeless tobacco: None  . Alcohol Use: No   OB History    No data available     Review of Systems  Constitutional: Negative for fever.  HENT: Positive for ear pain. Negative for hearing loss, mouth sores, sore throat and trouble swallowing.   Eyes: Negative for visual disturbance.  Respiratory: Negative for cough.   Musculoskeletal: Negative for neck pain and neck stiffness.  Skin: Negative for rash.  Neurological: Negative for dizziness and headaches.  All other systems reviewed and are negative.  Allergies  Gluten meal; Lactose intolerance (gi); Other; and Wheat bran  Home Medications   Prior to Admission medications   Medication Sig Start Date End Date Taking? Authorizing Provider  cephALEXin (KEFLEX) 500 MG capsule 2 caps po bid x 7 days 01/13/14   Junious SilkHannah Merrell, PA-C   HYDROcodone-acetaminophen (NORCO/VICODIN) 5-325 MG per tablet Take 1-2 tablets by mouth every 6 (six) hours as needed. 01/23/15   Junius FinnerErin O'Malley, PA-C  ibuprofen (ADVIL,MOTRIN) 600 MG tablet Take 1 tablet (600 mg total) by mouth every 6 (six) hours as needed. 01/23/15   Junius FinnerErin O'Malley, PA-C  Multiple Vitamin (MULTIVITAMIN WITH MINERALS) TABS Take 1 tablet by mouth daily.    Historical Provider, MD  naproxen (NAPROSYN) 500 MG tablet Take 1 tablet (500 mg total) by mouth 2 (two) times daily. 11/27/14   Joycie PeekBenjamin Cartner, PA-C  oseltamivir (TAMIFLU) 75 MG capsule Take 1 capsule (75 mg total) by mouth every 12 (twelve) hours. 01/13/14   Junious SilkHannah Merrell, PA-C  OVER THE COUNTER MEDICATION Take 1 capsule by mouth daily. Haw buck thorn    Historical Provider, MD  Probiotic Product (ALIGN) 4 MG CAPS Take 4 mg by mouth daily.    Historical Provider, MD  promethazine (PHENERGAN) 25 MG tablet Take 1 tablet (25 mg total) by mouth every 6 (six) hours as needed for nausea or vomiting. 01/13/14   Junious SilkHannah Merrell, PA-C   BP 107/81 mmHg  Pulse 97  Temp(Src) 98.2 F (36.8 C) (Oral)  Resp 18  SpO2 100% Physical Exam  Constitutional: She appears well-developed and well-nourished. No distress.  Nontoxic appearing.  HENT:  Head: Normocephalic and atraumatic.  Right Ear: Tympanic membrane and external ear normal.  Left Ear: Tympanic membrane and external ear normal.  Mouth/Throat: Oropharynx is clear and moist. No oropharyngeal exudate.  Mild edema to external auditory canal  on the right TM partially visualized and is pearly gray; No cerumen impaction.  No mastoid tenderness External ear is normal bilaterally. Hearing is grossly intact.  Throat clear.   Eyes: Conjunctivae are normal. Pupils are equal, round, and reactive to light. Right eye exhibits no discharge. Left eye exhibits no discharge.  Neck: Normal range of motion. Neck supple. No JVD present. No tracheal deviation present.  Cardiovascular: Normal rate, regular  rhythm, normal heart sounds and intact distal pulses.   Pulmonary/Chest: Effort normal and breath sounds normal. No stridor. No respiratory distress.  Abdominal: Soft. There is no tenderness.  Lymphadenopathy:    She has no cervical adenopathy.  Neurological: She is alert. Coordination normal.  Skin: Skin is warm and dry. No rash noted. She is not diaphoretic. No erythema. No pallor.  Psychiatric: She has a normal mood and affect. Her behavior is normal.  Nursing note and vitals reviewed.   ED Course  Procedures   DIAGNOSTIC STUDIES:  Oxygen Saturation is 100% on RA, normal by my interpretation.    COORDINATION OF CARE:  11:15 AM Discussed treatment plan with pt at bedside and pt agreed to plan.  MDM   Meds given in ED:  Medications  ciprofloxacin-dexamethasone (CIPRODEX) 0.3-0.1 % otic suspension 4 drop (not administered)    New Prescriptions   No medications on file    Final diagnoses:  Otitis externa, right   This  is a 55 y.o. female who presents to the Emergency Department complaining of intermittent, shooting pain in her right ear x 4 days. She notes her pain worsened today. She reports h/o similar pain when diagnosed with an ear infection in the past. She reports some slight nasal congestion that has resolved. Pt has taken tylenol with little relief. Pt denies hearing loss, ear discharge or fever. She also denies recently swimming.  On exam the patient is afebrile nontoxic appearing. Her external ears are normal. No mastoid tenderness. Patient does have some mild edema to her external auditory canal on her right. No significant discharge. TM is only partially visualized due to some cerumen. It appears to be pearly-gray without erythema. I attempted to remove some cerumen with curette the patient did not tolerate this well. Will start the patient on Ciprodex drops 4 appears to be otitis externa. No evidence of otitis media at this time. I discussed the expected course and  treatment with Ciprodex.  Pt afebrile in NAD. Exam not concerning for mastoiditis, cellulitis or malignant OE. Discharge with cipridex drops.  Advised follow up with the wellness center  in 2-3 days or with ENT Dr. Suszanne Conners if no improvement.  Return precautions discussed. I advised the patient to follow-up with their primary care provider this week. I advised the patient to return to the emergency department with new or worsening symptoms or new concerns. The patient verbalized understanding and agreement with plan.    I personally performed the services described in this documentation, which was scribed in my presence. The recorded information has been reviewed and is accurate.        Everlene Farrier, PA-C 05/08/16 1123  Donnetta Hutching, MD 05/10/16 2150

## 2016-05-08 NOTE — ED Notes (Signed)
Pt c/o right ear pain x 3 days. States has had this in the past.

## 2016-05-08 NOTE — Discharge Instructions (Signed)
Please use 4 drops of Ciprodex and near her right ear twice a day for the next 7 days.   Otitis Externa Otitis externa is a germ infection in the outer ear. The outer ear is the area from the eardrum to the outside of the ear. Otitis externa is sometimes called "swimmer's ear." HOME CARE  Put drops in the ear as told by your doctor.  Only take medicine as told by your doctor.  If you have diabetes, your doctor may give you more directions. Follow your doctor's directions.  Keep all doctor visits as told. To avoid another infection:  Keep your ear dry. Use the corner of a towel to dry your ear after swimming or bathing.  Avoid scratching or putting things inside your ear.  Avoid swimming in lakes, dirty water, or pools that use a chemical called chlorine poorly.  You may use ear drops after swimming. Combine equal amounts of white vinegar and alcohol in a bottle. Put 3 or 4 drops in each ear. GET HELP IF:   You have a fever.  Your ear is still red, puffy (swollen), or painful after 3 days.  You still have yellowish-white fluid (pus) coming from the ear after 3 days.  Your redness, puffiness, or pain gets worse.  You have a really bad headache.  You have redness, puffiness, pain, or tenderness behind your ear. MAKE SURE YOU:   Understand these instructions.  Will watch your condition.  Will get help right away if you are not doing well or get worse.   This information is not intended to replace advice given to you by your health care provider. Make sure you discuss any questions you have with your health care provider.   Document Released: 03/26/2008 Document Revised: 10/29/2014 Document Reviewed: 10/25/2011 Elsevier Interactive Patient Education 2016 [employer]. Ciprofloxacin; Dexamethasone ear suspension What is this medicine? CIPROFLOXACIN; DEXAMETHASONE (sip roe FLOX a sin; dex a METH a sone) is used to treat ear infections. It also stops the swelling and  itching caused by the infection. This medicine may be used for other purposes; ask your health care provider or pharmacist if you have questions. What should I tell my health care provider before I take this medicine? They need to know if you have any of these conditions: -any other active infection -viral ear infection -an unusual or allergic reaction to ciprofloxacin; dexamethasone, other medicines, foods, dyes, or preservatives -pregnant or trying to get pregnant -breast-feeding How should I use this medicine? This medicine is only for use in the ears. Wash your hands with soap and water. Do not insert any object or swab into the ear canal. Gently warm the bottle by holding it in the hand for 1 to 2 minutes. Shake the bottle immediately before using. Lie down on your side with the affected ear up. Try not to touch the tip of the dropper to your ear, fingertips, or other surface. Squeeze the bottle gently to put the prescribed number of drops in the ear canal. Stay in this position for 30 to 60 seconds to help the drops soak into the ear. Repeat the steps for the other ear if both ears are infected. Do not use your medicine more often than directed. Finish the full course of medicine prescribed by your doctor or health care professional even if you think your condition is better. Talk to your pediatrician regarding the use of this medicine in children. While this drug may be prescribed for children as young  as in children 626 months of age and older for selected conditions, precautions do apply. Overdosage: If you think you have taken too much of this medicine contact a poison control center or emergency room at once. NOTE: This medicine is only for you. Do not share this medicine with others. What if I miss a dose? If you miss a dose, use it as soon as you can. If it is almost time for your next dose, use only that dose. Do not use double or extra doses. What may interact with this  medicine? Interactions are not expected. Do not use any other ear products without telling your doctor or health care professional. This list may not describe all possible interactions. Give your health care provider a list of all the medicines, herbs, non-prescription drugs, or dietary supplements you use. Also tell them if you smoke, drink alcohol, or use illegal drugs. Some items may interact with your medicine. What should I watch for while using this medicine? Tell your doctor or health care professional if your ear infection does not get better in a few days. If rash or allergic reaction occurs, stop using immediately and contact your doctor or health care professional. It is important that you keep the infected ear(s) clean and dry. When bathing, try not to get the infected ear(s) wet. Do not go swimming unless your doctor or health care professional has told you otherwise. To prevent the spread of infection, do not share ear products or share towels and washcloths with anyone else. What side effects may I notice from receiving this medicine? Side effects that you should report to your doctor or health care professional as soon as possible: -allergic reactions like skin rash, itching or hives, swelling of the face, lips, or tongue -burning, itching, and redness -worsening ear pain Side effects that usually do not require medical attention (report to your doctor or health care professional if they continue or are bothersome): -abnormal feeling in the ear -headache -unpleasant feeling while putting the drops in the ear This list may not describe all possible side effects. Call your doctor for medical advice about side effects. You may report side effects to FDA at 1-800-FDA-1088. Where should I keep my medicine? Keep out of the reach of children. Store at room temperature between 15 and 30 degrees C (59 and 86 degrees F). Do not freeze. Protect from light. Throw away any unused medicine after  the expiration date. NOTE: This sheet is a summary. It may not cover all possible information. If you have questions about this medicine, talk to your doctor, pharmacist, or health care provider.    2016, Elsevier/Gold Standard. (2007-12-24 10:33:06)

## 2016-10-08 ENCOUNTER — Encounter (HOSPITAL_COMMUNITY): Payer: Self-pay

## 2016-10-08 ENCOUNTER — Emergency Department (HOSPITAL_COMMUNITY)
Admission: EM | Admit: 2016-10-08 | Discharge: 2016-10-08 | Disposition: A | Discharge status: Home / Self Care | Payer: PRIVATE HEALTH INSURANCE | Attending: Emergency Medicine | Admitting: Emergency Medicine

## 2016-10-08 DIAGNOSIS — J0100 Acute maxillary sinusitis, unspecified: Secondary | ICD-10-CM

## 2016-10-08 DIAGNOSIS — J0110 Acute frontal sinusitis, unspecified: Secondary | ICD-10-CM | POA: Insufficient documentation

## 2016-10-08 MED ORDER — AMOXICILLIN-POT CLAVULANATE 875-125 MG PO TABS: 1.0000 | ORAL_TABLET | Freq: Two times a day (BID) | ORAL | 0 refills | Status: DC

## 2016-10-08 NOTE — Discharge Instructions (Signed)
Take Zyrtec over the counter

## 2016-10-08 NOTE — ED Triage Notes (Signed)
Pt reports sinus pressure for several days. Pt reports pressure around her eyes and drainage into her throat.

## 2016-10-08 NOTE — ED Provider Notes (Signed)
[facility]-EMERGENCY DEPT Provider Note   CSN: [id number] Arrival date & time: 10/08/16  [id number]  By signing my name below, I, Carrie [name], attest that this documentation has been prepared under the direction and in the presence of Wells FargoKelly Aalaya Yadao, PA-C. Electronically Signed: Angelene GiovanniEmmanuella [name], ED Scribe. 10/08/16. 9:29 AM.   History   Chief Complaint Chief Complaint  Patient presents with  . Facial Pain    HPI Comments: Carrie SpannerDonna M [name] is a 55 y.o. female who presents to the Emergency Department complaining of gradual onset, persistent moderate frontal headache she describes as pressure, worsening 3 days ago. Symptoms started 2 weeks ago. She reports associated post nasal drip, sinus pressure, and tenderness with mild swelling to her anterior neck (worse to her right). She notes that the pain is worse when she turns her eyes to the left and with movement of her head. No alleviating factors noted. She states that she has tried OTC decongestant medications, Sudafed, and Benadryl with no relief. She explains that she has had these sinus pressure symptoms in the past and is currently working on fixing her carpet because she believes it is contributing to her symptoms. She reports a hx of otitis media several months ago. She denies a hx of hypertension or any cardiac issues. She also denies any known fever, cough, ear pain, difficulty breathing, dizziness, numbness/tingling, or any other symptoms.   No PCP.    The history is provided by the patient. No language interpreter was used.    History reviewed. No pertinent past medical history.  Patient Active Problem List   Diagnosis Date Noted  . LEUKOCYTOPENIA UNSPECIFIED 11/07/2010  . 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 HYPHEMA 11/07/2010  . 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 OVARIAN CYST 11/07/2010  . NONSPEC ELEVATION OF LEVELS OF TRANSAMINASE/LDH 11/07/2010  . NEPHROLITHIASIS, HX OF 11/07/2010    History reviewed. No pertinent surgical  history.  OB History    No data available       Home Medications    Prior to Admission medications   Medication Sig Start Date End Date Taking? Authorizing Provider  cephALEXin (KEFLEX) 500 MG capsule 2 caps po bid x 7 days 01/13/14   Junious SilkHannah Merrell, PA-C  HYDROcodone-acetaminophen (NORCO/VICODIN) 5-325 MG per tablet Take 1-2 tablets by mouth every 6 (six) hours as needed. 01/23/15   Junius FinnerErin O'Malley, PA-C  ibuprofen (ADVIL,MOTRIN) 600 MG tablet Take 1 tablet (600 mg total) by mouth every 6 (six) hours as needed. 01/23/15   Junius FinnerErin O'Malley, PA-C  Multiple Vitamin (MULTIVITAMIN WITH MINERALS) TABS Take 1 tablet by mouth daily.    Historical Provider, MD  naproxen (NAPROSYN) 500 MG tablet Take 1 tablet (500 mg total) by mouth 2 (two) times daily. 11/27/14   Joycie PeekBenjamin Cartner, PA-C  oseltamivir (TAMIFLU) 75 MG capsule Take 1 capsule (75 mg total) by mouth every 12 (twelve) hours. 01/13/14   Junious SilkHannah Merrell, PA-C  OVER THE COUNTER MEDICATION Take 1 capsule by mouth daily. Haw buck thorn    Historical Provider, MD  Probiotic Product (ALIGN) 4 MG CAPS Take 4 mg by mouth daily.    Historical Provider, MD  promethazine (PHENERGAN) 25 MG tablet Take 1 tablet (25 mg total) by mouth every 6 (six) hours as needed for nausea or vomiting. 01/13/14   Junious SilkHannah Merrell, PA-C    Family History History reviewed. No pertinent family history.  Social History Social History  Substance Use Topics  . Smoking status: Never Smoker  . Smokeless tobacco: Never Used  . Alcohol use  No     Allergies   Gluten meal; Lactose intolerance (gi); Other; and Wheat bran   Review of Systems Review of Systems  Constitutional: Negative for fever.  HENT: Positive for postnasal drip and sinus pressure. Negative for ear pain.   Respiratory: Negative for cough and shortness of breath.   Neurological: Positive for headaches. Negative for dizziness and numbness.     Physical Exam Updated Vital Signs BP 126/82 (BP Location: Left Arm)    Pulse 86   Temp 98 F (36.7 C) (Oral)   Resp 18   Ht 5\' 5"  (1.651 m)   Wt 185 lb (83.9 kg)   SpO2 100%   BMI 30.79 kg/m   Physical Exam  Constitutional: She is oriented to person, place, and time. She appears well-developed and well-nourished. No distress.  HENT:  Head: Normocephalic and atraumatic.  Right Ear: Hearing, tympanic membrane, external ear and ear canal normal.  Left Ear: Hearing, tympanic membrane, external ear and ear canal normal.  Nose: Right sinus exhibits maxillary sinus tenderness and frontal sinus tenderness. Left sinus exhibits maxillary sinus tenderness and frontal sinus tenderness.  Mouth/Throat: Uvula is midline, oropharynx is clear and moist and mucous membranes are normal.  Eyes: Conjunctivae and EOM are normal.  Neck: Neck supple.  Cardiovascular: Normal rate and regular rhythm.  Exam reveals no gallop and no friction rub.   No murmur heard. Pulmonary/Chest: Effort normal and breath sounds normal. No respiratory distress. She has no wheezes. She has no rales. She exhibits no tenderness.  Musculoskeletal: Normal range of motion.  Neurological: She is alert and oriented to person, place, and time.  Skin: Skin is warm and dry.  Psychiatric: She has a normal mood and affect. Her behavior is normal.  Nursing note and vitals reviewed.    ED Treatments / Results  DIAGNOSTIC STUDIES: Oxygen Saturation is 100% on RA, normal by my interpretation.    COORDINATION OF CARE: 9:28 AM- Pt advised of plan for treatment and pt agrees. Pt will receive Augmentin and advised to take Claritin D. Will provide resources for PCP follow up.   Labs (all labs ordered are listed, but only abnormal results are displayed) Labs Reviewed - No data to display  EKG  EKG Interpretation None       Radiology No results found.  Procedures Procedures (including critical care time)  Medications Ordered in ED Medications - No data to display   Initial Impression /  Assessment and Plan / ED Course  Terance HartKelly Trevaris Pennella, PA-C has reviewed the triage vital signs and the nursing notes.  Pertinent labs & imaging results that were available during my care of the patient were reviewed by me and considered in my medical decision making (see chart for details).  Clinical Course    Mild to moderate symptoms of sinus pressure for more than 10 days.  Patient is afebrile. Due to symptoms being present for over 10 days will discharge with Augmentin.  Patient instructions given for warm saline nasal washes.  Recommendations for follow-up with primary care physician.    Final Clinical Impressions(s) / ED Diagnoses   Final diagnoses:  Acute maxillary sinusitis, recurrence not specified    New Prescriptions New Prescriptions   No medications on file   I personally performed the services described in this documentation, which was scribed in my presence. The recorded information has been reviewed and is accurate. Be    Bethel BornKelly Marie Ellary Casamento, PA-C 10/12/16 1434    Maia PlanJoshua G Long, MD 10/12/16  1719  

## 2016-10-08 NOTE — ED Notes (Signed)
Pt is in stable condition upon d/c and ambulates from ED. Pt given [facility]309.

## 2016-10-11 ENCOUNTER — Emergency Department (HOSPITAL_COMMUNITY): Payer: PRIVATE HEALTH INSURANCE

## 2016-10-11 ENCOUNTER — Emergency Department (HOSPITAL_COMMUNITY)
Admission: EM | Admit: 2016-10-11 | Discharge: 2016-10-11 | Disposition: A | Discharge status: Home / Self Care | Payer: PRIVATE HEALTH INSURANCE | Attending: Emergency Medicine | Admitting: Emergency Medicine

## 2016-10-11 ENCOUNTER — Encounter (HOSPITAL_COMMUNITY): Payer: Self-pay | Admitting: Nurse Practitioner

## 2016-10-11 DIAGNOSIS — J0100 Acute maxillary sinusitis, unspecified: Secondary | ICD-10-CM

## 2016-10-11 DIAGNOSIS — F419 Anxiety disorder, unspecified: Secondary | ICD-10-CM

## 2016-10-11 HISTORY — DX: Unspecified ovarian cyst, unspecified side: N83.209

## 2016-10-11 LAB — I-STAT TROPONIN, ED: TROPONIN I, POC: 0 ng/mL (ref 0.00–0.08)

## 2016-10-11 LAB — CBC WITH DIFFERENTIAL/PLATELET
BASOS PCT: 1 %
Basophils Absolute: 0 10*3/uL (ref 0.0–0.1)
Eosinophils Absolute: 0.2 10*3/uL (ref 0.0–0.7)
Eosinophils Relative: 4 %
HCT: 36.6 % (ref 36.0–46.0)
Hemoglobin: 11.9 g/dL — ABNORMAL LOW (ref 12.0–15.0)
LYMPHS PCT: 32 %
Lymphs Abs: 1.7 10*3/uL (ref 0.7–4.0)
MCH: 28.4 pg (ref 26.0–34.0)
MCHC: 32.5 g/dL (ref 30.0–36.0)
MCV: 87.4 fL (ref 78.0–100.0)
MONO ABS: 0.4 10*3/uL (ref 0.1–1.0)
Monocytes Relative: 8 %
Neutro Abs: 3 10*3/uL (ref 1.7–7.7)
Neutrophils Relative %: 57 %
Platelets: 197 10*3/uL (ref 150–400)
RBC: 4.19 MIL/uL (ref 3.87–5.11)
RDW: 14.4 % (ref 11.5–15.5)
WBC: 5.4 10*3/uL (ref 4.0–10.5)

## 2016-10-11 LAB — BASIC METABOLIC PANEL
Anion gap: 11 (ref 5–15)
BUN: 9 mg/dL (ref 6–20)
CALCIUM: 9.1 mg/dL (ref 8.9–10.3)
CO2: 24 mmol/L (ref 22–32)
Chloride: 107 mmol/L (ref 101–111)
Creatinine, Ser: 0.74 mg/dL (ref 0.44–1.00)
GFR calc Af Amer: >60 mL/min (ref >60)
GFR calc non Af Amer: >60 mL/min (ref >60)
GLUCOSE: 84 mg/dL (ref 65–99)
Potassium: 4.1 mmol/L (ref 3.5–5.1)
Sodium: 142 mmol/L (ref 135–145)

## 2016-10-11 MED ORDER — AMOXICILLIN-POT CLAVULANATE 875-125 MG PO TABS
1.0000 | ORAL_TABLET | Freq: Once | ORAL | Status: AC
  Administered 2016-10-11: 1 via ORAL
  Filled 2016-10-11: qty 1

## 2016-10-11 MED ORDER — ALBUTEROL SULFATE (2.5 MG/3ML) 0.083% IN NEBU
5.0000 mg | INHALATION_SOLUTION | Freq: Once | RESPIRATORY_TRACT | Status: AC
  Administered 2016-10-11: 5 mg via RESPIRATORY_TRACT
  Filled 2016-10-11: qty 6

## 2016-10-11 MED ORDER — PREDNISONE 20 MG PO TABS
60.0000 mg | ORAL_TABLET | Freq: Once | ORAL | Status: AC
  Administered 2016-10-11: 60 mg via ORAL
  Filled 2016-10-11: qty 3

## 2016-10-11 MED ORDER — PREDNISONE 50 MG PO TABS: ORAL_TABLET | ORAL | 0 refills | Status: DC

## 2016-10-11 MED ORDER — ALBUTEROL SULFATE HFA 108 (90 BASE) MCG/ACT IN AERS
1.0000 | INHALATION_SPRAY | Freq: Once | RESPIRATORY_TRACT | Status: DC
  Filled 2016-10-11: qty 6.7

## 2016-10-11 NOTE — Discharge Instructions (Signed)
Please resume taking your antibiotics, take them as directed and to the end.  You can use Flonase nasal spray which is avilable over the counter. Use nasal saline (you can try [employer] Simply Saline) at least 4 times a day, use saline 5-10 minutes before using the fluticasone (flonase) nasal spray  Do not use Afrin (Oxymetazoline)  Rest, wash hands frequently  and drink plenty of water.  You may try over the counter medication such as allegra-decongestant or claritin-decongestant and/or Mucinex or decongestants.  Push fluids to try to thin the mucus and get it to flow out the nose.   Do not hesitate to return to the emergency room for any new, worsening or concerning symptoms.  Please obtain primary care using resource guide below. Let them know that you were seen in the emergency room and that they will need to obtain records for further outpatient management.

## 2016-10-11 NOTE — ED Triage Notes (Signed)
Pt presents with c/o cough. She was here last week for sinus infection and started on antibiotics. She reports she feels worse since she started the antibiotics and now she is having chest congestion, a productive cough with greens sputum, and generally does not feel well. She denies fevers.

## 2016-10-11 NOTE — ED Provider Notes (Signed)
[facility] Provider Note   CSN: [id number] Arrival date & time: 10/11/16  1441     History   Chief Complaint Chief Complaint  Patient presents with  . URI   HPI   Blood pressure 138/93, pulse 75, temperature 97.6 F (36.4 C), temperature source Oral, resp. rate 17, SpO2 100 %.  Carrie [name] is a 55 y.o. female complaining of productive cough, sinus pressure, sore throat, hoarse voice, Chest pressure. States that has been worsening over the course of approximately 10 days, she was started on Augmentin for a sinusitis and she completed 2 days but states that she felt like the medication may be making her so she DC'd it for a day. She denies any fever, chills, nausea, vomiting. She does endorse shortness of breath. No pleuritic chest pain no peripheral edema she states that she feels like the cough is putting pressure on her heart and she feels something else is going on something is very wrong.   Past Medical History:  Diagnosis Date  . Ovarian cyst     Patient Active Problem List   Diagnosis Date Noted  . LEUKOCYTOPENIA UNSPECIFIED 11/07/2010  . 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 HYPHEMA 11/07/2010  . 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 OVARIAN CYST 11/07/2010  . NONSPEC ELEVATION OF LEVELS OF TRANSAMINASE/LDH 11/07/2010  . NEPHROLITHIASIS, HX OF 11/07/2010    Past Surgical History:  Procedure Laterality Date  . OVARIAN CYST REMOVAL      OB History    No data available       Home Medications    Prior to Admission medications   Medication Sig Start Date End Date Taking? Authorizing Provider  amoxicillin-clavulanate (AUGMENTIN) 875-125 MG tablet Take 1 tablet by mouth every 12 (twelve) hours. 10/08/16   Bethel BornKelly Marie Gekas, PA-C  cephALEXin [facility](KEFLEX) 500 MG capsule 2 caps po bid x 7 days 01/13/14   Junious SilkHannah Merrell, PA-C  HYDROcodone-acetaminophen (NORCO/VICODIN) 5-325 MG per tablet Take 1-2 tablets by mouth every 6 (six) hours as needed. 01/23/15   Junius FinnerErin  O'Malley, PA-C  ibuprofen (ADVIL,MOTRIN) 600 MG tablet Take 1 tablet (600 mg total) by mouth every 6 (six) hours as needed. 01/23/15   Junius FinnerErin O'Malley, PA-C  Multiple Vitamin (MULTIVITAMIN WITH MINERALS) TABS Take 1 tablet by mouth daily.    Historical Provider, MD  naproxen (NAPROSYN) 500 MG tablet Take 1 tablet (500 mg total) by mouth 2 (two) times daily. 11/27/14   Joycie PeekBenjamin Cartner, PA-C  oseltamivir (TAMIFLU) 75 MG capsule Take 1 capsule (75 mg total) by mouth every 12 (twelve) hours. 01/13/14   Junious SilkHannah Merrell, PA-C  OVER THE COUNTER MEDICATION Take 1 capsule by mouth daily. Haw buck thorn    Historical Provider, MD  predniSONE (DELTASONE) 50 MG tablet Take 1 tablet daily with breakfast 10/11/16   Joni ReiningNicole Marco Adelson, PA-C  Probiotic Product (ALIGN) 4 MG CAPS Take 4 mg by mouth daily.    Historical Provider, MD  promethazine (PHENERGAN) 25 MG tablet Take 1 tablet (25 mg total) by mouth every 6 (six) hours as needed for nausea or vomiting. 01/13/14   Junious SilkHannah Merrell, PA-C    Family History History reviewed. No pertinent family history.  Social History Social History  Substance Use Topics  . Smoking status: Never Smoker  . Smokeless tobacco: Never Used  . Alcohol use No     Allergies   Gluten meal; Lactose intolerance (gi); Other; and Wheat bran   Review of Systems Review of Systems  10 systems reviewed and found to  be negative, except as noted in the HPI.   Physical Exam Updated Vital Signs BP 119/70   Pulse 76   Temp 97.6 F (36.4 C) (Oral)   Resp (!) 27   SpO2 98%   Physical Exam  Constitutional: She appears well-developed and well-nourished.  HENT:  Head: Normocephalic.  Right Ear: External ear normal.  Left Ear: External ear normal.  Mouth/Throat: Oropharynx is clear and moist. No oropharyngeal exudate.  No drooling or stridor. Posterior pharynx mildly erythematous no significant tonsillar hypertrophy. No exudate. Soft palate rises symmetrically. No TTP or induration under  tongue.   ++ palpation of frontal or bilateral maxillary sinuses.  Mild mucosal edema in the nares with scant rhinorrhea.  Bilateral tympanic membranes with normal architecture and good light reflex.    Eyes: Conjunctivae and EOM are normal. Pupils are equal, round, and reactive to light.  Neck: Normal range of motion. Neck supple.  Cardiovascular: Normal rate and regular rhythm.   Pulmonary/Chest: Effort normal and breath sounds normal. No stridor. No respiratory distress. She has no wheezes. She has no rales. She exhibits no tenderness.  Abdominal: Soft. There is no tenderness. There is no rebound and no guarding.  Psychiatric:  Agitated, anxious, difficult to focus  Nursing note and vitals reviewed.    ED Treatments / Results  Labs (all labs ordered are listed, but only abnormal results are displayed) Labs Reviewed  CBC WITH DIFFERENTIAL/PLATELET - Abnormal; Notable for the following:       Result Value   Hemoglobin 11.9 (*)    All other components within normal limits  BASIC METABOLIC PANEL  I-STAT TROPOININ, ED    EKG  EKG Interpretation  Date/Time:  Thursday October 11 2016 16:47:46 EST Ventricular Rate:  79 PR Interval:    QRS Duration: 84 QT Interval:  383 QTC Calculation: 439 R Axis:   30 Text Interpretation:  Sinus rhythm Atrial premature complex Low voltage, precordial leads Confirmed by DELO  MD, DOUGLAS ([id number]) on 10/11/2016 5:19:47 PM       Radiology Dg Chest 2 View  Result Date: 10/11/2016 CLINICAL DATA:  Cough, congestion, right chest pain EXAM: CHEST  2 VIEW COMPARISON:  10/07/2016 FINDINGS: Lungs are clear.  No pleural effusion or pneumothorax. The heart is normal in size. Visualized osseous structures are within normal limits. IMPRESSION: Normal chest radiographs. Electronically Signed   By: Charline BillsSriyesh  Krishnan M.D.   On: 10/11/2016 16:07    Procedures Procedures (including critical care time)  Medications Ordered in ED Medications    albuterol (PROVENTIL HFA;VENTOLIN HFA) 108 (90 Base) MCG/ACT inhaler 1 puff (not administered)  albuterol (PROVENTIL) (2.5 MG/3ML) 0.083% nebulizer solution 5 mg (5 mg Nebulization Given 10/11/16 1726)  predniSONE (DELTASONE) tablet 60 mg (60 mg Oral Given 10/11/16 1728)  amoxicillin-clavulanate (AUGMENTIN) 875-125 MG per tablet 1 tablet (1 tablet Oral Given 10/11/16 1726)     Initial Impression / Assessment and Plan / ED Course  I have reviewed the triage vital signs and the nursing notes.  Pertinent labs & imaging results that were available during my care of the patient were reviewed by me and considered in my medical decision making (see chart for details).  Clinical Course     Vitals:   10/11/16 1522 10/11/16 1700  BP: 138/93 119/70  Pulse: 75 76  Resp: 17 (!) 27  Temp: 97.6 F (36.4 C)   TempSrc: Oral   SpO2: 100% 98%    Medications  albuterol (PROVENTIL HFA;VENTOLIN HFA) 108 (90 Base) MCG/ACT  inhaler 1 puff (not administered)  albuterol (PROVENTIL) (2.5 MG/3ML) 0.083% nebulizer solution 5 mg (5 mg Nebulization Given 10/11/16 1726)  predniSONE (DELTASONE) tablet 60 mg (60 mg Oral Given 10/11/16 1728)  amoxicillin-clavulanate (AUGMENTIN) 875-125 MG per tablet 1 tablet (1 tablet Oral Given 10/11/16 1726)    Carrie [name] is 55 y.o. female presenting with Rhinorrhea, sinus congestion, productive cough and agitation. Lung sounds are clear, chest x-rays without infiltrate, vital signs were reassuring. This patient is quite agitated, I tried my best to reassure her and we've had an extensive discussion of the findings and plan. In chart review I see that this patient was seen multiple times at [facility], she does not have a history of frequent ED visits. We've had a very candid discussion on if there is anything going on in her life is stressing her and is causing this agitation. She states that she recently had a death in the family, she states that she is not suicidal  or homicidal she's denies any auditory or visual hallucinations, she states that she hasn't have a primary care physician but she is working with the people at the hospice center to get counseling. I have consulted our case management to try to arrange to have her be seen for primary care evaluation. I have explained to this patient that she will need to complete the full course of her antibiotics, I've given her anticipatory guidance that her symptoms will not resolve immediately. Patient will be sent home with a prescription for prednisone and an albuterol inhaler. It seems that she received a prescription for that at [facility] however, she states that her sister lost the prescription it is not filled.   Evaluation does not show pathology that would require ongoing emergent intervention or inpatient treatment. Pt is hemodynamically stable and mentating appropriately. Discussed findings and plan with patient/guardian, who agrees with care plan. All questions answered. Return precautions discussed and outpatient follow up given.      Final Clinical Impressions(s) / ED Diagnoses   Final diagnoses:  Acute maxillary sinusitis, recurrence not specified  Anxiety    New Prescriptions Discharge Medication List as of 10/11/2016  6:16 PM    START taking these medications   Details  predniSONE (DELTASONE) 50 MG tablet Take 1 tablet daily with breakfast, Print         Wynetta Emery, PA-C 10/11/16 2134    Geoffery Lyons, MD 10/11/16 2300

## 2016-10-11 NOTE — ED Notes (Signed)
Pt ambulated to room from waiting room, tolerated well. 

## 2017-11-28 ENCOUNTER — Other Ambulatory Visit: Payer: Self-pay

## 2017-11-28 ENCOUNTER — Emergency Department (HOSPITAL_COMMUNITY): Payer: PRIVATE HEALTH INSURANCE

## 2017-11-28 ENCOUNTER — Encounter (HOSPITAL_COMMUNITY): Payer: Self-pay | Admitting: Emergency Medicine

## 2017-11-28 ENCOUNTER — Encounter (HOSPITAL_COMMUNITY): Payer: Self-pay | Admitting: *Deleted

## 2017-11-28 ENCOUNTER — Ambulatory Visit (HOSPITAL_COMMUNITY)
Admission: EM | Admit: 2017-11-28 | Discharge: 2017-11-28 | Disposition: A | Discharge status: Home / Self Care | Payer: Self-pay | Attending: Family Medicine | Admitting: Family Medicine

## 2017-11-28 ENCOUNTER — Emergency Department (HOSPITAL_COMMUNITY)
Admission: EM | Admit: 2017-11-28 | Discharge: 2017-11-28 | Disposition: A | Discharge status: Home / Self Care | Payer: PRIVATE HEALTH INSURANCE | Attending: Emergency Medicine | Admitting: Emergency Medicine

## 2017-11-28 DIAGNOSIS — J209 Acute bronchitis, unspecified: Secondary | ICD-10-CM

## 2017-11-28 DIAGNOSIS — R509 Fever, unspecified: Secondary | ICD-10-CM | POA: Insufficient documentation

## 2017-11-28 DIAGNOSIS — J0110 Acute frontal sinusitis, unspecified: Secondary | ICD-10-CM

## 2017-11-28 DIAGNOSIS — Z5321 Procedure and treatment not carried out due to patient leaving prior to being seen by health care provider: Secondary | ICD-10-CM | POA: Insufficient documentation

## 2017-11-28 MED ORDER — PREDNISONE 10 MG PO TABS: 40.0000 mg | ORAL_TABLET | Freq: Every day | ORAL | 0 refills | Status: AC

## 2017-11-28 MED ORDER — ALBUTEROL SULFATE HFA 108 (90 BASE) MCG/ACT IN AERS: 1.0000 | INHALATION_SPRAY | Freq: Four times a day (QID) | RESPIRATORY_TRACT | 0 refills | Status: DC | PRN

## 2017-11-28 MED ORDER — AMOXICILLIN-POT CLAVULANATE 875-125 MG PO TABS: 1.0000 | ORAL_TABLET | Freq: Two times a day (BID) | ORAL | 0 refills | Status: DC

## 2017-11-28 NOTE — ED Triage Notes (Signed)
Pt c/o cough and congestion for a couple days. Also states it hurts her eyes to move them.

## 2017-11-28 NOTE — ED Provider Notes (Signed)
[facility]    CSN: [id number] Arrival date & time: 11/28/17  1419     History   Chief Complaint Chief Complaint  Patient presents with  . Nasal Congestion  . Cough    HPI Carrie [name] is a 57 y.o. female.   57 year old female, presenting today complaining of sinus pain and pressure, nasal congestion and cough that started has any fever.  States that she has had a frontal headache.  Has been trying over-the-counter medications without much relief.  States that she has a history of sinus infections and this feels the same.   The history is provided by the patient.  URI  Presenting symptoms: congestion, cough and facial pain   Presenting symptoms: no ear pain, no fever and no sore throat   Severity:  Moderate Onset quality:  Gradual Duration:  4 days Timing:  Constant Progression:  Worsening Chronicity:  Recurrent Relieved by:  Nothing Worsened by:  Nothing Ineffective treatments:  OTC medications Associated symptoms: sinus pain   Associated symptoms: no arthralgias, no headaches, no myalgias and no neck pain   Risk factors: not elderly, no chronic cardiac disease, no chronic kidney disease, no chronic respiratory disease and no diabetes mellitus     Past Medical History:  Diagnosis Date  . Ovarian cyst     Patient Active Problem List   Diagnosis Date Noted  . LEUKOCYTOPENIA UNSPECIFIED 11/07/2010  . 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 HYPHEMA 11/07/2010  . 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 OVARIAN CYST 11/07/2010  . NONSPEC ELEVATION OF LEVELS OF TRANSAMINASE/LDH 11/07/2010  . NEPHROLITHIASIS, HX OF 11/07/2010    Past Surgical History:  Procedure Laterality Date  . OVARIAN CYST REMOVAL      OB History    No data available       Home Medications    Prior to Admission medications   Medication Sig Start Date End Date Taking? Authorizing Provider  albuterol (PROVENTIL HFA;VENTOLIN HFA) 108 (90 Base) MCG/ACT inhaler Inhale 1-2 puffs  into the lungs every 6 (six) hours as needed for wheezing or shortness of breath. 11/28/17   Ramell Wacha C, PA-C  amoxicillin-clavulanate (AUGMENTIN) 875-125 MG tablet Take 1 tablet by mouth every 12 (twelve) hours. 11/28/17   Zamariah Seaborn C, PA-C  ibuprofen (ADVIL,MOTRIN) 600 MG tablet Take 1 tablet (600 mg total) by mouth every 6 (six) hours as needed. 01/23/15   Lurene ShadowPhelps, Erin O, PA-C  Multiple Vitamin (MULTIVITAMIN WITH MINERALS) TABS Take 1 tablet by mouth daily.    [provider]  naproxen (NAPROSYN) 500 MG tablet Take 1 tablet (500 mg total) by mouth 2 (two) times daily. 11/27/14   Joycie Peekartner, Benjamin, PA-C  oseltamivir (TAMIFLU) 75 MG capsule Take 1 capsule (75 mg total) by mouth every 12 (twelve) hours. 01/13/14   Junious SilkMerrell, Hannah, PA-C  OVER THE COUNTER MEDICATION Take 1 capsule by mouth daily. Haw buck thorn    [provider]  predniSONE (DELTASONE) 10 MG tablet Take 4 tablets (40 mg total) by mouth daily for 5 days. 11/28/17 12/03/17  Nupur Hohman C, PA-C  Probiotic Product (ALIGN) 4 MG CAPS Take 4 mg by mouth daily.    [provider]  promethazine (PHENERGAN) 25 MG tablet Take 1 tablet (25 mg total) by mouth every 6 (six) hours as needed for nausea or vomiting. 01/13/14   Junious SilkMerrell, Hannah, PA-C    Family History No family history on file.  Social History Social History   Tobacco Use  . Smoking status: Never  Smoker  . Smokeless tobacco: Never Used  Substance Use Topics  . Alcohol use: No  . Drug use: No     Allergies   Gluten meal; Lactose intolerance (gi); Other; and Wheat bran   Review of Systems Review of Systems  Constitutional: Negative for chills and fever.  HENT: Positive for congestion, sinus pressure and sinus pain. Negative for ear pain and sore throat.   Eyes: Negative for pain and visual disturbance.  Respiratory: Positive for cough. Negative for shortness of breath.   Cardiovascular: Negative for chest pain and palpitations.    Gastrointestinal: Negative for abdominal pain and vomiting.  Genitourinary: Negative for dysuria and hematuria.  Musculoskeletal: Negative for arthralgias, back pain, myalgias and neck pain.  Skin: Negative for color change and rash.  Neurological: Negative for seizures, syncope and headaches.  All other systems reviewed and are negative.    Physical Exam Triage Vital Signs ED Triage Vitals  Enc Vitals Group     BP --      Pulse Rate 11/28/17 1536 83     Resp 11/28/17 1536 18     Temp 11/28/17 1536 98.1 F (36.7 C)     Temp src --      SpO2 11/28/17 1536 100 %     Weight --      Height --      Head Circumference --      Peak Flow --      Pain Score 11/28/17 1537 6     Pain Loc --      Pain Edu? --      Excl. in GC? --    No data found.  Updated Vital Signs Pulse 83   Temp 98.1 F (36.7 C)   Resp 18   SpO2 100%   Visual Acuity Right Eye Distance:   Left Eye Distance:   Bilateral Distance:    Right Eye Near:   Left Eye Near:    Bilateral Near:     Physical Exam  Constitutional: She appears well-developed and well-nourished. No distress.  HENT:  Head: Normocephalic and atraumatic.  Right Ear: Hearing, tympanic membrane, external ear and ear canal normal.  Left Ear: Hearing, tympanic membrane, external ear and ear canal normal.  Nose: Right sinus exhibits maxillary sinus tenderness and frontal sinus tenderness. Left sinus exhibits maxillary sinus tenderness and frontal sinus tenderness.  Mouth/Throat: Oropharynx is clear and moist. No oropharyngeal exudate, posterior oropharyngeal edema, posterior oropharyngeal erythema or tonsillar abscesses.  Eyes: Conjunctivae are normal.  Neck: Neck supple.  Cardiovascular: Normal rate and regular rhythm.  No murmur heard. Pulmonary/Chest: Effort normal. No stridor. No respiratory distress. She has wheezes in the right upper field, the right middle field, the right lower field, the left upper field, the left middle field  and the left lower field. She has no rhonchi. She has no rales.  Abdominal: Soft. There is no tenderness.  Musculoskeletal: She exhibits no edema.  Neurological: She is alert.  Skin: Skin is warm and dry.  Psychiatric: She has a normal mood and affect.  Nursing note and vitals reviewed.    UC Treatments / Results  Labs (all labs ordered are listed, but only abnormal results are displayed) Labs Reviewed - No data to display  EKG  EKG Interpretation None       Radiology Dg Chest 2 View  Result Date: 11/28/2017 CLINICAL DATA:  Cough and chest pain for 1 week EXAM: CHEST  2 VIEW COMPARISON:  12/13/2015 FINDINGS: The heart  size and mediastinal contours are within normal limits. Both lungs are clear. The visualized skeletal structures are unremarkable. IMPRESSION: No active cardiopulmonary disease. Electronically Signed   By: Alcide Clever M.D.   On: 11/28/2017 10:08    Procedures Procedures (including critical care time)  Medications Ordered in UC Medications - No data to display   Initial Impression / Assessment and Plan / UC Course  I have reviewed the triage vital signs and the nursing notes.  Pertinent labs & imaging results that were available during my care of the patient were reviewed by me and considered in my medical decision making (see chart for details).     Acute sinusitis.  Patient also has wheezing on exam.  Will treat with Augmentin, prednisone and albuterol for wheezing  Final Clinical Impressions(s) / UC Diagnoses   Final diagnoses:  Acute non-recurrent frontal sinusitis  Acute bronchitis, unspecified organism    ED Discharge Orders        Ordered    amoxicillin-clavulanate (AUGMENTIN) 875-125 MG tablet  Every 12 hours     11/28/17 1553    predniSONE (DELTASONE) 10 MG tablet  Daily     11/28/17 1553    albuterol (PROVENTIL HFA;VENTOLIN HFA) 108 (90 Base) MCG/ACT inhaler  Every 6 hours PRN     11/28/17 1553       Controlled Substance  Prescriptions [place] Controlled Substance Registry consulted? Not Applicable   Alecia Lemming, [place] 11/28/17 4098

## 2017-11-28 NOTE — ED Triage Notes (Signed)
Pt has congestion in chest and has pressure over eyes and hurts when she looks up and moves her eyes. Pt has body aches and fever.  Pt states symptoms started a couple days ago.

## 2018-10-10 ENCOUNTER — Ambulatory Visit (HOSPITAL_COMMUNITY)
Admission: EM | Admit: 2018-10-10 | Discharge: 2018-10-10 | Disposition: A | Discharge status: Home / Self Care | Payer: Self-pay | Attending: Internal Medicine | Admitting: Internal Medicine

## 2018-10-10 ENCOUNTER — Encounter (HOSPITAL_COMMUNITY): Payer: Self-pay | Admitting: Emergency Medicine

## 2018-10-10 ENCOUNTER — Ambulatory Visit (INDEPENDENT_AMBULATORY_CARE_PROVIDER_SITE_OTHER): Payer: Self-pay

## 2018-10-10 DIAGNOSIS — R0602 Shortness of breath: Secondary | ICD-10-CM

## 2018-10-10 DIAGNOSIS — J0120 Acute ethmoidal sinusitis, unspecified: Secondary | ICD-10-CM | POA: Insufficient documentation

## 2018-10-10 DIAGNOSIS — J209 Acute bronchitis, unspecified: Secondary | ICD-10-CM | POA: Insufficient documentation

## 2018-10-10 MED ORDER — IPRATROPIUM-ALBUTEROL 0.5-2.5 (3) MG/3ML IN SOLN
3.0000 mL | Freq: Once | RESPIRATORY_TRACT | Status: AC
  Administered 2018-10-10: 3 mL via RESPIRATORY_TRACT

## 2018-10-10 MED ORDER — PREDNISONE 20 MG PO TABS: 20.0000 mg | ORAL_TABLET | Freq: Every day | ORAL | 0 refills | Status: DC

## 2018-10-10 MED ORDER — AMOXICILLIN-POT CLAVULANATE 875-125 MG PO TABS: 1.0000 | ORAL_TABLET | Freq: Two times a day (BID) | ORAL | 0 refills | Status: DC

## 2018-10-10 MED ORDER — IPRATROPIUM-ALBUTEROL 0.5-2.5 (3) MG/3ML IN SOLN
RESPIRATORY_TRACT | Status: AC
  Filled 2018-10-10: qty 3

## 2018-10-10 NOTE — Discharge Instructions (Addendum)
Try doing saline rinses with saline, using a kit called Netie Pot twice a day for 3-5 days. This will clean out the thick mucous down your throat. Also drink 60-80 oz of fluids a day, which will also help with thinning mucous from your sinuses.  I am placing you on an antibiotic for sinus infection which also covers pneumonia type bacteria, and prednisone to help the sinus inflammation and pain. You may take Tylenol 1000 mg every 6 hours for pain. Do not take Ibuprofen type meds while on the prednisone.

## 2018-10-10 NOTE — ED Triage Notes (Signed)
[facility] 3 weeks of sinus pressure, headache, bilateral eye pain, intermittent nausea that caused her to vomit up ibuprofen she tried to take earlier today.  Blood streaked mucous when blowing her nose.

## 2018-10-10 NOTE — ED Provider Notes (Signed)
[place]    CSN: [id number] Arrival date & time: 10/10/18  1450     History   Chief Complaint Chief Complaint  Patient presents with  . Facial Pain    HPI Carrie [name] is a 57 y.o. female.   Who presents with onset of face and forehead pain 2 weeks ago and she has been taking Advil, til 2 days ago, the HA got worse. Pain gets worse with  Bending over.   Prior to the onset of pain, she had been having some sneezing and mild rhinitis for 2 days.  Felt feverish, with chills last week and has continued. Has thick PND she cant swallow it down. She is unable to spit it out. Has been using mucinex, and afrin.  The drainage is making nauseous and gags    Past Medical History:  Diagnosis Date  . Ovarian cyst     Patient Active Problem List   Diagnosis Date Noted  . LEUKOCYTOPENIA UNSPECIFIED 11/07/2010  . 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 HYPHEMA 11/07/2010  . 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 OVARIAN CYST 11/07/2010  . NONSPEC ELEVATION OF LEVELS OF TRANSAMINASE/LDH 11/07/2010  . NEPHROLITHIASIS, HX OF 11/07/2010    Past Surgical History:  Procedure Laterality Date  . OVARIAN CYST REMOVAL      OB History   No obstetric history on file.      Home Medications    Prior to Admission medications   Medication Sig Start Date End Date Taking? Authorizing Provider  amoxicillin-clavulanate (AUGMENTIN) 875-125 MG tablet Take 1 tablet by mouth every 12 (twelve) hours. 10/10/18   Rodriguez-Southworth, Sunday Spillers, PA-C  ibuprofen (ADVIL,MOTRIN) 600 MG tablet Take 1 tablet (600 mg total) by mouth every 6 (six) hours as needed. 01/23/15   Noe Gens, PA-C  OVER THE COUNTER MEDICATION Take 1 capsule by mouth daily. Haw buck thorn    [provider]  predniSONE (DELTASONE) 20 MG tablet Take 1 tablet (20 mg total) by mouth daily with breakfast. 10/10/18   Rodriguez-Southworth, Sunday Spillers, PA-C    Family History History reviewed. No pertinent family  history.  Social History Social History   Tobacco Use  . Smoking status: Never Smoker  . Smokeless tobacco: Never Used  Substance Use Topics  . Alcohol use: No  . Drug use: No     Allergies   Gluten meal; Lactose intolerance (gi); Other; and Wheat bran   Review of Systems Review of Systems  Constitutional: Positive for appetite change, chills, diaphoresis, fatigue and fever.       Has decreased SOB  HENT: Positive for postnasal drip, sinus pressure, sinus pain and sneezing. Negative for dental problem, ear discharge, ear pain, sore throat, trouble swallowing and voice change.        Has scratchy sore throat.   Eyes: Negative for discharge.  Respiratory: Positive for cough and shortness of breath. Negative for chest tightness and wheezing.        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  Has  Wheezy cough  Cardiovascular: Negative for chest pain, palpitations and leg swelling.  Gastrointestinal: Positive for nausea and vomiting. Negative for abdominal pain and diarrhea.       Nausea yesterday, and vomited today x 1 this am.   Genitourinary: Negative for difficulty urinating.  Musculoskeletal: Positive for arthralgias and joint swelling. Negative for gait problem, neck pain and neck stiffness.  Skin: Negative for rash.  Neurological:  Positive for light-headedness and headaches.       Feels a little off balance when she walks.   Hematological: Negative for adenopathy.     Physical Exam Triage Vital Signs ED Triage Vitals [10/10/18 1501]  Enc Vitals Group     BP 114/66     Pulse Rate 90     Resp 18     Temp 97.9 F (36.6 C)     Temp Source Oral     SpO2 96 %     Weight      Height      Head Circumference      Peak Flow      Pain Score 8     Pain Loc      Pain Edu?      Excl. in [place]?    No data found.  Updated Vital Signs BP 125/88 (BP Location: Right Arm)   Pulse 85   Temp 98.2 F (36.8 C) (Oral)    Resp 18   SpO2 95%  Post neb Pulse Ox 98%. She was able to walk in the room and felt minimal SOB Visual Acuity Right Eye Distance:   Left Eye Distance:   Bilateral Distance:    Right Eye Near:   Left Eye Near:    Bilateral Near:     Physical Exam Vitals signs and nursing note reviewed.  Constitutional:      General: She is not in acute distress.    Appearance: Normal appearance. She is not ill-appearing, toxic-appearing or diaphoretic.  HENT:     Head: Normocephalic.     Comments: R canal with wax, TM not visible    Right Ear: There is impacted cerumen.     Left Ear: Tympanic membrane and ear canal normal. There is no impacted cerumen.     Nose: Nose normal. No congestion or rhinorrhea.     Comments: Has tenderness on all sinuses but L side is worse.     Mouth/Throat:     Mouth: Mucous membranes are moist.     Pharynx: Oropharynx is clear. No oropharyngeal exudate or posterior oropharyngeal erythema.  Eyes:     General: No scleral icterus.       Right eye: No discharge.        Left eye: No discharge.     Extraocular Movements: Extraocular movements intact.     Conjunctiva/sclera: Conjunctivae normal.     Pupils: Pupils are equal, round, and reactive to light.  Neck:     Musculoskeletal: Neck supple. No neck rigidity.  Cardiovascular:     Rate and Rhythm: Normal rate and regular rhythm.     Heart sounds: No murmur.  Pulmonary:     Effort: Pulmonary effort is normal. No respiratory distress.     Breath sounds: No wheezing or rales.     Comments: Has a wheezy sounding cough Musculoskeletal: Normal range of motion.  Lymphadenopathy:     Cervical: No cervical adenopathy.  Skin:    General: Skin is warm and dry.     Findings: No rash.  Neurological:     General: No focal deficit present.     Mental Status: She is alert and oriented to person, place, and time.  Psychiatric:        Mood and Affect: Mood normal.        Behavior: Behavior normal.        Thought Content:  Thought content normal.        Judgment: Judgment normal.  UC Treatments / Results  Labs (all labs ordered are listed, but only abnormal results are displayed) Labs Reviewed - No data to display  EKG None  Radiology Dg Chest 2 View  Result Date: 10/10/2018 CLINICAL DATA:  Short of breath EXAM: CHEST - 2 VIEW COMPARISON:  11/28/2017 FINDINGS: Upper normal heart size. Lungs are mildly hyperaerated and clear. No pneumothorax or pleural effusion. IMPRESSION: No active cardiopulmonary disease. Electronically Signed   By: Marybelle Killings M.D.   On: 10/10/2018 15:46    Procedures Procedures   Medications Ordered in UC Medications  ipratropium-albuterol (DUONEB) 0.5-2.5 (3) MG/3ML nebulizer solution 3 mL (3 mLs Nebulization Given 10/10/18 1531)    Initial Impression / Assessment and Plan / UC Course  I have reviewed the triage vital signs and the nursing notes. Pertinent  imaging results that were available during my care of the patient were reviewed by me and considered in my medical decision making (see chart for details). I explained to her she has secondary infection from being sick so long. I placed her on Augmentin, Prednisone to help the sinus inflammation and may take Tylenol for pain. See instruction.  Needs to do saline rinses as well.    Final Clinical Impressions(s) / UC Diagnoses   Final diagnoses:  Subacute ethmoidal sinusitis  Acute bronchitis, unspecified organism     Discharge Instructions     Try doing saline rinses with saline, using a kit called Netie Pot twice a day for 3-5 days. This will clean out the thick mucous down your throat. Also drink 60-80 oz of fluids a day, which will also help with thinning mucous from your sinuses.  I am placing you on an antibiotic for sinus infection which also covers pneumonia type bacteria, and prednisone to help the sinus inflammation and pain. You may take Tylenol 1000 mg every 6 hours for pain. Do not take Ibuprofen type  meds while on the prednisone.     ED Prescriptions    Medication Sig Dispense Auth. Provider   amoxicillin-clavulanate (AUGMENTIN) 875-125 MG tablet Take 1 tablet by mouth every 12 (twelve) hours. 14 tablet Rodriguez-Southworth, Sunday Spillers, PA-C   predniSONE (DELTASONE) 20 MG tablet Take 1 tablet (20 mg total) by mouth daily with breakfast. 5 tablet Rodriguez-Southworth, Sunday Spillers, PA-C     Controlled Substance Prescriptions [place] Controlled Substance Registry consulted?    Shelby Mattocks, PA-C 10/10/18 1625

## 2018-11-01 ENCOUNTER — Other Ambulatory Visit: Payer: Self-pay

## 2018-11-01 ENCOUNTER — Emergency Department (HOSPITAL_COMMUNITY)
Admission: EM | Admit: 2018-11-01 | Discharge: 2018-11-01 | Disposition: A | Discharge status: Home / Self Care | Payer: Self-pay | Attending: Emergency Medicine | Admitting: Emergency Medicine

## 2018-11-01 DIAGNOSIS — H6121 Impacted cerumen, right ear: Secondary | ICD-10-CM | POA: Insufficient documentation

## 2018-11-01 DIAGNOSIS — Z79899 Other long term (current) drug therapy: Secondary | ICD-10-CM | POA: Insufficient documentation

## 2018-11-01 MED ORDER — CARBAMIDE PEROXIDE 6.5 % OT SOLN: 5.0000 [drp] | Freq: Two times a day (BID) | OTIC | 0 refills | Status: DC

## 2018-11-01 MED ORDER — HYDROGEN PEROXIDE 3 % EX SOLN
Freq: Once | CUTANEOUS | Status: AC
  Administered 2018-11-01: 13:00:00 via TOPICAL
  Filled 2018-11-01: qty 473

## 2018-11-01 NOTE — ED Notes (Signed)
Patient verbalizes understanding of discharge instructions. Opportunity for questioning and answers were provided. Armband removed by staff, pt discharged from ED ambulatory.   

## 2018-11-01 NOTE — Discharge Instructions (Addendum)
You were evaluated in the emergency department for decreased hearing from your right ear and concern for wax.  We were able to remove some wax and irrigated some out too.  We are prescribing some drops to help soften the wax.  Also we are giving the number for an ear nose throat doctor if your symptoms are not fully improved.

## 2018-11-01 NOTE — ED Provider Notes (Signed)
[facility] EMERGENCY DEPARTMENT Provider Note   CSN: [id number] Arrival date & time: 11/01/18  1218     History   Chief Complaint Chief Complaint  Patient presents with  . Hearing changes    HPI Carrie [name] is a 58 y.o. female.  He is complaining of muffled hearing to her right ear.  She was seen in urgent care a couple of weeks ago for sinus problems and was treated with antibiotics and steroids.  For the last couple of days she has noticed that her hearing is been muffled.  No real pain.  No recent fevers or chills.  She denies any trauma to the area.  She is tried nothing for it.  The history is provided by the patient.  Ear Fullness  This is a new problem. The current episode started 2 days ago. The problem occurs constantly. The problem has not changed since onset.Pertinent negatives include no chest pain, no abdominal pain, no headaches and no shortness of breath. Nothing aggravates the symptoms. Nothing relieves the symptoms. She has tried nothing for the symptoms. The treatment provided no relief.    Past Medical History:  Diagnosis Date  . Ovarian cyst     Patient Active Problem List   Diagnosis Date Noted  . LEUKOCYTOPENIA UNSPECIFIED 11/07/2010  . 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 HYPHEMA 11/07/2010  . 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 OVARIAN CYST 11/07/2010  . NONSPEC ELEVATION OF LEVELS OF TRANSAMINASE/LDH 11/07/2010  . NEPHROLITHIASIS, HX OF 11/07/2010    Past Surgical History:  Procedure Laterality Date  . OVARIAN CYST REMOVAL       OB History   No obstetric history on file.      Home Medications    Prior to Admission medications   Medication Sig Start Date End Date Taking? Authorizing Provider  amoxicillin-clavulanate (AUGMENTIN) 875-125 MG tablet Take 1 tablet by mouth every 12 (twelve) hours. 10/10/18   Rodriguez-Southworth, Nettie Elm, PA-C  ibuprofen (ADVIL,MOTRIN) 600 MG tablet Take 1 tablet (600 mg total) by mouth  every 6 (six) hours as needed. 01/23/15   Lurene Shadow, PA-C  OVER THE COUNTER MEDICATION Take 1 capsule by mouth daily. Haw buck thorn    [provider]  predniSONE (DELTASONE) 20 MG tablet Take 1 tablet (20 mg total) by mouth daily with breakfast. 10/10/18   Rodriguez-Southworth, Nettie Elm, PA-C    Family History No family history on file.  Social History Social History   Tobacco Use  . Smoking status: Never Smoker  . Smokeless tobacco: Never Used  Substance Use Topics  . Alcohol use: No  . Drug use: No     Allergies   Gluten meal; Lactose intolerance (gi); Other; and Wheat bran   Review of Systems Review of Systems  Constitutional: Negative for fever.  HENT: Positive for hearing loss. Negative for sore throat.   Eyes: Negative for visual disturbance.  Respiratory: Negative for shortness of breath.   Cardiovascular: Negative for chest pain.  Gastrointestinal: Negative for abdominal pain.  Genitourinary: Negative for dysuria.  Musculoskeletal: Negative for neck pain.  Skin: Negative for rash.  Neurological: Negative for headaches.     Physical Exam Updated Vital Signs BP 136/80 (BP Location: Right Arm)   Pulse 87   Temp 98.7 F (37.1 C) (Oral)   Resp 16   Ht 5\' 3"  (1.6 m)   Wt 83.9 kg   SpO2 99%   BMI 32.77 kg/m   Physical Exam Constitutional:  Appearance: She is well-developed.  HENT:     Head: Normocephalic and atraumatic.     Right Ear: External ear normal. There is impacted cerumen.     Left Ear: External ear normal.     Nose: Nose normal.     Mouth/Throat:     Mouth: Mucous membranes are moist.     Pharynx: Oropharynx is clear.  Eyes:     Conjunctiva/sclera: Conjunctivae normal.  Neck:     Musculoskeletal: Neck supple.  Pulmonary:     Effort: Pulmonary effort is normal.     Breath sounds: Normal breath sounds.  Skin:    General: Skin is warm and dry.  Neurological:     Mental Status: She is alert.     GCS: GCS eye subscore is  4. GCS verbal subscore is 5. GCS motor subscore is 6.      ED Treatments / Results  Labs (all labs ordered are listed, but only abnormal results are displayed) Labs Reviewed - No data to display  EKG None  Radiology No results found.  Procedures .Ear Cerumen Removal Date/Time: 11/01/2018 1:12 PM Performed by: Terrilee Files, MD Authorized by: Terrilee Files, MD   Consent:    Consent obtained:  Verbal   Consent given by:  Patient   Risks discussed:  Bleeding, infection, pain, TM perforation and dizziness   Alternatives discussed:  Delayed treatment and alternative treatment Procedure details:    Location:  R ear   Procedure type: irrigation     Procedure type comment:  And currette Post-procedure details:    Inspection:  TM intact   Hearing quality:  Improved   Patient tolerance of procedure:  Tolerated well, no immediate complications   (including critical care time)  Medications Ordered in ED Medications  hydrogen peroxide 3 % external solution ( Topical Given by Other 11/01/18 1258)     Initial Impression / Assessment and Plan / ED Course  I have reviewed the triage vital signs and the nursing notes.  Pertinent labs & imaging results that were available during my care of the patient were reviewed by me and considered in my medical decision making (see chart for details).    Patient had improved hearing after some cerumen removal by curetting and irrigation.  We will send her home with a prescription for some ceruminolytic's.  Final Clinical Impressions(s) / ED Diagnoses   Final diagnoses:  Impacted cerumen of right ear    ED Discharge Orders         Ordered    carbamide peroxide (DEBROX) 6.5 % OTIC solution  2 times daily     11/01/18 1315           Terrilee Files, MD 11/01/18 1349

## 2018-11-01 NOTE — ED Triage Notes (Signed)
Pt arrives to ed with complaints of muffled sounds in her right ear for several days and thinks there may be wax buildup. No wax visible at this time. Patient placed in position of comfort with call bell in reach.

## 2019-02-26 ENCOUNTER — Encounter (HOSPITAL_COMMUNITY): Payer: Self-pay | Admitting: Emergency Medicine

## 2019-02-26 ENCOUNTER — Emergency Department (HOSPITAL_COMMUNITY)
Admission: EM | Admit: 2019-02-26 | Discharge: 2019-02-26 | Disposition: A | Discharge status: Home / Self Care | Payer: Self-pay | Attending: Emergency Medicine | Admitting: Emergency Medicine

## 2019-02-26 ENCOUNTER — Other Ambulatory Visit: Payer: Self-pay

## 2019-02-26 DIAGNOSIS — R21 Rash and other nonspecific skin eruption: Secondary | ICD-10-CM | POA: Insufficient documentation

## 2019-02-26 DIAGNOSIS — L539 Erythematous condition, unspecified: Secondary | ICD-10-CM | POA: Insufficient documentation

## 2019-02-26 DIAGNOSIS — R197 Diarrhea, unspecified: Secondary | ICD-10-CM | POA: Insufficient documentation

## 2019-02-26 DIAGNOSIS — L299 Pruritus, unspecified: Secondary | ICD-10-CM | POA: Insufficient documentation

## 2019-02-26 MED ORDER — TRAMADOL HCL 50 MG PO TABS: 50.0000 mg | ORAL_TABLET | Freq: Four times a day (QID) | ORAL | 0 refills | Status: DC | PRN

## 2019-02-26 MED ORDER — ACYCLOVIR 800 MG PO TABS: 800.0000 mg | ORAL_TABLET | Freq: Every day | ORAL | 0 refills | Status: AC

## 2019-02-26 MED ORDER — SULFAMETHOXAZOLE-TRIMETHOPRIM 800-160 MG PO TABS: 1.0000 | ORAL_TABLET | Freq: Two times a day (BID) | ORAL | 0 refills | Status: AC

## 2019-02-26 NOTE — ED Triage Notes (Signed)
Pt presents with painful rash under both breasts x 3 days.

## 2019-02-26 NOTE — Discharge Instructions (Signed)
You were seen in the emergency department for painful burning rash.  Your symptoms are most consistent with shingles although typically shingles does not affect both sides of the body.  We will treat you for shingles with acyclovir for the virus outbreak, Bactrim for an early superimposed bacterial infection and tramadol for pain.  For more pain control take ibuprofen or acetaminophen every 6-8 hours.  Keep the area dry as best you can.  Return to the ED if there is any lesions near your eyes or face, facial paralysis, severe headache, neck pain or stiffness, fevers, worsening blisters despite medicines.  Shingles rash is contagious for 7 to 10 days since the first lesion, typically we recommend contact precautions for at least 2 weeks and until all the lesions have scabbed over.

## 2019-02-26 NOTE — ED Provider Notes (Signed)
[facility] EMERGENCY DEPARTMENT Provider Note   CSN: [id number] Arrival date & time: 02/26/19  1617    History   Chief Complaint Chief Complaint  Patient presents with  . Rash    HPI Carrie [name] is a 58 y.o. female presents to the ED for evaluation of painful rash.  Onset 4 to 5 days ago.  Reports initially feeling intense burning and tingling to the skin over her sternum, 2 days later she noticed there were some blisters in that area.  Since she has noticed blisters have spread to around the right inframammary fold.  They are intensely painful and burning, constant, worsening.  Mildly itchy.  Today she noticed some lesions on her left intermammary fold.  Has been putting glycerin and calamine lotion without significant relief.  Has felt unwell for the last several days as well with generalized malaise, intermittent nonbloody diarrhea.  She stopped taking her daily vitamins recently..  Denies any frank fever, cough, sore throat, congestion, chest pain, shortness of breath, dysuria, melena.  States her father had shingles that looked similarly.  History of chickenpox.  No new topical agents, detergents.  No sick contacts.  No alleviating or aggravating factors.     HPI  Past Medical History:  Diagnosis Date  . Ovarian cyst     Patient Active Problem List   Diagnosis Date Noted  . LEUKOCYTOPENIA UNSPECIFIED 11/07/2010  . 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 HYPHEMA 11/07/2010  . 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 OVARIAN CYST 11/07/2010  . NONSPEC ELEVATION OF LEVELS OF TRANSAMINASE/LDH 11/07/2010  . NEPHROLITHIASIS, HX OF 11/07/2010    Past Surgical History:  Procedure Laterality Date  . OVARIAN CYST REMOVAL       OB History   No obstetric history on file.      Home Medications    Prior to Admission medications   Medication Sig Start Date End Date Taking? Authorizing Provider  acyclovir (ZOVIRAX) 800 MG tablet Take 1 tablet (800 mg total) by  mouth 5 (five) times daily for 7 days. 02/26/19 03/05/19  Liberty Handy, PA-C  amoxicillin-clavulanate (AUGMENTIN) 875-125 MG tablet Take 1 tablet by mouth every 12 (twelve) hours. 10/10/18   Rodriguez-Southworth, Nettie Elm, PA-C  carbamide peroxide (DEBROX) 6.5 % OTIC solution Place 5 drops into the right ear 2 (two) times daily. 11/01/18   Terrilee Files, MD  ibuprofen (ADVIL,MOTRIN) 600 MG tablet Take 1 tablet (600 mg total) by mouth every 6 (six) hours as needed. 01/23/15   Lurene Shadow, PA-C  OVER THE COUNTER MEDICATION Take 1 capsule by mouth daily. Haw buck thorn    [provider]  predniSONE (DELTASONE) 20 MG tablet Take 1 tablet (20 mg total) by mouth daily with breakfast. 10/10/18   Rodriguez-Southworth, Nettie Elm, PA-C  sulfamethoxazole-trimethoprim (BACTRIM DS) 800-160 MG tablet Take 1 tablet by mouth 2 (two) times daily for 7 days. 02/26/19 03/05/19  Liberty Handy, PA-C  traMADol (ULTRAM) 50 MG tablet Take 1 tablet (50 mg total) by mouth every 6 (six) hours as needed. 02/26/19   Liberty Handy, PA-C    Family History No family history on file.  Social History Social History   Tobacco Use  . Smoking status: Never Smoker  . Smokeless tobacco: Never Used  Substance Use Topics  . Alcohol use: No  . Drug use: No     Allergies   Gluten meal; Lactose intolerance (gi); Other; and Wheat bran   Review of Systems Review of Systems  Gastrointestinal: Positive for diarrhea.  Skin: Positive for rash.  All other systems reviewed and are negative.    Physical Exam Updated Vital Signs BP 118/89 (BP Location: Right Arm)   Pulse 78   Temp 98.5 F (36.9 C) (Oral)   Resp 18   SpO2 97%   Physical Exam Vitals signs and nursing note reviewed.  Constitutional:      Appearance: She is well-developed.     Comments: Non toxic in NAD  HENT:     Head: Normocephalic and atraumatic.     Nose: Nose normal.  Eyes:     Conjunctiva/sclera: Conjunctivae normal.  Neck:      Musculoskeletal: Normal range of motion.  Cardiovascular:     Rate and Rhythm: Normal rate and regular rhythm.  Pulmonary:     Effort: Pulmonary effort is normal.     Breath sounds: Normal breath sounds.  Abdominal:     General: Bowel sounds are normal.     Palpations: Abdomen is soft.     Tenderness: There is no abdominal tenderness.     Comments: No G/R/R. No suprapubic or CVA tenderness. Negative Murphy's and McBurney's. Active BS to lower quadrants.   Musculoskeletal: Normal range of motion.  Skin:    General: Skin is warm and dry.     Capillary Refill: Capillary refill takes less than 2 seconds.     Findings: Rash present.     Comments: Grouped vesicles with underlying erythema in the sternum and right inframammary fold.  There is some yellow discharge on the largest cluster of lesions on the center of the chest.  Smaller cluster of similar grouped vesicles to the left breast.  Rash is exquisitely tender.  No rash to the flank/back.  Neurological:     Mental Status: She is alert.  Psychiatric:        Behavior: Behavior normal.            ED Treatments / Results  Labs (all labs ordered are listed, but only abnormal results are displayed) Labs Reviewed - No data to display  EKG None  Radiology No results found.  Procedures Procedures (including critical care time)  Medications Ordered in ED Medications - No data to display   Initial Impression / Assessment and Plan / ED Course  I have reviewed the triage vital signs and the nursing notes.  Pertinent labs & imaging results that were available during my care of the patient were reviewed by me and considered in my medical decision making (see chart for details).        Exquisitely tender vesicular rash with underlying erythema in dermatomal distribution most consistent with shingles.  There are some smaller vesicular lesions to the opposite side so bilateral distribution is not typical of shingles however the  rest of the history and exam fits this diagnosis.  She has been feeling unwell with intermittent likely viral diarrhea, stopped taking her multivitamins and she had prodromal burning/tingling prior to the onset of rash.  Has history of chickenpox.  Father with shingles in similar distribution. Given severity of pain, purulent discharge on the vesicles, will discharge with antiviral medicine, tramadol for pain, bactrim for early superimposed infection.  I suspect inframammary fold/moist skin is not allowing lesions to heel as well.  Encouraged high-dose NSAIDs as well for pain control, Benadryl for itching.  No significant h/o immunocompromise. Return precautions given.   Final Clinical Impressions(s) / ED Diagnoses   Final diagnoses:  Rash    ED Discharge  Orders         Ordered    acyclovir (ZOVIRAX) 800 MG tablet  5 times daily     02/26/19 2052    traMADol (ULTRAM) 50 MG tablet  Every 6 hours PRN     02/26/19 2052    sulfamethoxazole-trimethoprim (BACTRIM DS) 800-160 MG tablet  2 times daily     02/26/19 2052           Jerrell Mylar 02/26/19 2052    Charlynne Pander, MD 02/26/19 [phone]

## 2020-08-15 ENCOUNTER — Other Ambulatory Visit: Payer: Self-pay

## 2020-08-15 ENCOUNTER — Emergency Department (HOSPITAL_COMMUNITY)
Admission: EM | Admit: 2020-08-15 | Discharge: 2020-08-15 | Disposition: A | Discharge status: Home / Self Care | Payer: Self-pay | Attending: Emergency Medicine | Admitting: Emergency Medicine

## 2020-08-15 DIAGNOSIS — H6121 Impacted cerumen, right ear: Secondary | ICD-10-CM | POA: Insufficient documentation

## 2020-08-15 NOTE — ED Triage Notes (Signed)
Pt here from home with c/o right ear pain and hearing loss , pt thinks she just has a build of wax in her ear , no drainage

## 2020-08-15 NOTE — Discharge Instructions (Addendum)
Seen here for ear wax removal.  Exam looks reassuring.  I recommend against placing any foreign objects into your ear as this can cause worsening earwax buildup.  You can buy over-the-counter earwax removal drops I recommend buying those and using them as needed.  Please follow-up with the PCP for further evaluation management.  Come back to the emergency department if you develop chest pain, shortness of breath, severe abdominal pain, uncontrolled nausea, vomiting, diarrhea.

## 2020-08-15 NOTE — ED Provider Notes (Signed)
[facility] EMERGENCY DEPARTMENT Provider Note   CSN: [id number] Arrival date & time: 08/15/20  1203     History No chief complaint on file.   Carrie [name] is a 59 y.o. female.  HPI   Patient with no recent medical history presents emergency department with chief complaint of right-sided ear fullness.  Patient states over the last few days she felt like she has had something stuck in her ear and has had decreased hearing.  She denies headaches, dizziness, nasal congestion, sore throat, cough.  She denies any ear pain or ear drainage but does endorse that she has had clogged ears in the past where she had come in the emergency part have him cleaned out.  She denies any recent water activities or times where she got water stuck in her ear.  She denies placing Q-tips or other foreign bodies in her ear.  Patient denies headache, fever, chills, shortness of breath, chest pain, dumping, nausea, vomiting, diarrhea, pedal edema.  Past Medical History:  Diagnosis Date  . Ovarian cyst     Patient Active Problem List   Diagnosis Date Noted  . LEUKOCYTOPENIA UNSPECIFIED 11/07/2010  . 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 HYPHEMA 11/07/2010  . 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 OVARIAN CYST 11/07/2010  . NONSPEC ELEVATION OF LEVELS OF TRANSAMINASE/LDH 11/07/2010  . NEPHROLITHIASIS, HX OF 11/07/2010    Past Surgical History:  Procedure Laterality Date  . OVARIAN CYST REMOVAL       OB History   No obstetric history on file.     No family history on file.  Social History   Tobacco Use  . Smoking status: Never Smoker  . Smokeless tobacco: Never Used  Substance Use Topics  . Alcohol use: No  . Drug use: No    Home Medications Prior to Admission medications   Medication Sig Start Date End Date Taking? Authorizing Provider  amoxicillin-clavulanate (AUGMENTIN) 875-125 MG tablet Take 1 tablet by mouth every 12 (twelve) hours. 10/10/18    Rodriguez-Southworth, Nettie Elm, PA-C  carbamide peroxide (DEBROX) 6.5 % OTIC solution Place 5 drops into the right ear 2 (two) times daily. 11/01/18   Terrilee Files, MD  ibuprofen (ADVIL,MOTRIN) 600 MG tablet Take 1 tablet (600 mg total) by mouth every 6 (six) hours as needed. 01/23/15   Lurene Shadow, PA-C  OVER THE COUNTER MEDICATION Take 1 capsule by mouth daily. Haw buck thorn    [provider]  predniSONE (DELTASONE) 20 MG tablet Take 1 tablet (20 mg total) by mouth daily with breakfast. 10/10/18   Rodriguez-Southworth, Nettie Elm, PA-C  traMADol (ULTRAM) 50 MG tablet Take 1 tablet (50 mg total) by mouth every 6 (six) hours as needed. 02/26/19   Liberty Handy, PA-C    Allergies    Gluten meal, Lactose intolerance (gi), Other, and Wheat bran  Review of Systems   Review of Systems  Constitutional: Negative for chills and fever.  HENT: Positive for hearing loss. Negative for congestion, tinnitus, trouble swallowing and voice change.        Right ear fullness.  Eyes: Negative for visual disturbance.  Respiratory: Negative for shortness of breath.   Cardiovascular: Negative for chest pain.  Gastrointestinal: Negative for abdominal pain, diarrhea, nausea, rectal pain and vomiting.  Genitourinary: Negative for enuresis.  Musculoskeletal: Negative for back pain.  Skin: Negative for rash.  Neurological: Negative for dizziness and headaches.  Hematological: Does not bruise/bleed easily.    Physical Exam Updated Vital Signs BP 120/79  Pulse 88   Temp 98.3 F (36.8 C)   Resp 18   Ht 5\' 3"  (1.6 m)   Wt 83.9 kg   SpO2 98%   BMI 32.77 kg/m   Physical Exam Vitals and nursing note reviewed.  Constitutional:      General: She is not in acute distress.    Appearance: Normal appearance. She is not ill-appearing or diaphoretic.  HENT:     Head: Normocephalic and atraumatic.     Right Ear: Ear canal and external ear normal. There is impacted cerumen.     Left Ear: Tympanic  membrane, ear canal and external ear normal. There is no impacted cerumen.     Ears:     Comments: Patient's left ear was visualized canal and TM did not show any signs of infections.  Right ear was visualized canal did not show any signs of infection but there was a large amount of cerumen impacted.  Unable to see TMs at this time.    Nose: No congestion or rhinorrhea.  Eyes:     General: No scleral icterus.       Right eye: No discharge.        Left eye: No discharge.     Conjunctiva/sclera: Conjunctivae normal.  Pulmonary:     Effort: Pulmonary effort is normal. No respiratory distress.     Breath sounds: Normal breath sounds. No wheezing.  Musculoskeletal:     Cervical back: Neck supple.     Right lower leg: No edema.     Left lower leg: No edema.  Skin:    General: Skin is warm and dry.     Coloration: Skin is not jaundiced or pale.  Neurological:     Mental Status: She is alert and oriented to person, place, and time.  Psychiatric:        Mood and Affect: Mood normal.     ED Results / Procedures / Treatments   Labs (all labs ordered are listed, but only abnormal results are displayed) Labs Reviewed - No data to display  EKG None  Radiology No results found.  Procedures .Ear Cerumen Removal  Date/Time: 08/15/2020 2:51 PM Performed by: 08/17/2020, PA-C Authorized by: Carroll Sage, PA-C   Consent:    Consent obtained:  Verbal   Consent given by:  Patient   Risks discussed:  Bleeding, infection, pain, dizziness, incomplete removal and TM perforation   Alternatives discussed:  No treatment Procedure details:    Location:  R ear   Procedure type: irrigation   Post-procedure details:    Inspection:  TM intact   Hearing quality:  Improved   Patient tolerance of procedure:  Tolerated well, no immediate complications   (including critical care time)  Medications Ordered in ED Medications - No data to display  ED Course  I have reviewed the  triage vital signs and the nursing notes.  Pertinent labs & imaging results that were available during my care of the patient were reviewed by me and considered in my medical decision making (see chart for details).    MDM Rules/Calculators/A&P                          I have personally reviewed all imaging, labs and have interpreted them.  Patient presents with right-sided ear fullness.  She is alert, did not appear acute distress, vital signs reassuring,.  Due to well-appearing patient benign physical exam further lab work and  imaging not warranted at this time.  Due to large amount of cerumen in right ear will go and irrigate ear canal.  With hydroperoxide diluted with sterile water cerumen was successfully removed.  Patient tolerated the procedure well.  Ear canal and TM were intact, pearly white, no signs of infection noted.  Patient states her hearing felt much better.  I have low suspicion for otitis externa or otitis media as ear canal was visualized no signs of infection noted.  Low suspicion for mastoiditis as there is no ear protrusion, mastoids were palpated nontender to palpation.  No signs of cholesteatoma noted during exam.  I suspect patient had increased amount of cerumen which caused her to have decreased hearing.  Once it was removed patient's hearing was restored.  I recommend over-the-counter cerumen dissolving solutions and following up with PCP for further evaluation.  Vital signs have remained stable, no indication for hospital admission.    Patient given at home care as well strict return precautions.  Patient verbalized that they understood agreed to said plan.    Final Clinical Impression(s) / ED Diagnoses Final diagnoses:  Impacted cerumen of right ear    Rx / DC Orders ED Discharge Orders    None       Carroll Sage, PA-C 08/15/20 1506    Arby Barrette, MD 08/16/20 1025

## 2021-01-09 ENCOUNTER — Encounter (HOSPITAL_COMMUNITY): Payer: Self-pay

## 2021-01-09 ENCOUNTER — Other Ambulatory Visit: Payer: Self-pay

## 2021-01-09 ENCOUNTER — Emergency Department (HOSPITAL_COMMUNITY)
Admission: EM | Admit: 2021-01-09 | Discharge: 2021-01-09 | Disposition: A | Discharge status: Home / Self Care | Payer: Self-pay | Attending: Emergency Medicine | Admitting: Emergency Medicine

## 2021-01-09 ENCOUNTER — Emergency Department (HOSPITAL_COMMUNITY): Payer: Self-pay

## 2021-01-09 DIAGNOSIS — R519 Headache, unspecified: Secondary | ICD-10-CM | POA: Insufficient documentation

## 2021-01-09 DIAGNOSIS — N390 Urinary tract infection, site not specified: Secondary | ICD-10-CM

## 2021-01-09 DIAGNOSIS — R42 Dizziness and giddiness: Secondary | ICD-10-CM | POA: Insufficient documentation

## 2021-01-09 DIAGNOSIS — R531 Weakness: Secondary | ICD-10-CM | POA: Insufficient documentation

## 2021-01-09 LAB — COMPREHENSIVE METABOLIC PANEL
ALT: 27 U/L (ref 0–44)
AST: 27 U/L (ref 15–41)
Albumin: 3.7 g/dL (ref 3.5–5.0)
Alkaline Phosphatase: 48 U/L (ref 38–126)
Anion gap: 6 (ref 5–15)
BUN: 15 mg/dL (ref 6–20)
CO2: 23 mmol/L (ref 22–32)
Calcium: 8.8 mg/dL — ABNORMAL LOW (ref 8.9–10.3)
Chloride: 109 mmol/L (ref 98–111)
Creatinine, Ser: 0.7 mg/dL (ref 0.44–1.00)
GFR, Estimated: >60 mL/min (ref >60)
Glucose, Bld: 97 mg/dL (ref 70–99)
Potassium: 4.7 mmol/L (ref 3.5–5.1)
Sodium: 138 mmol/L (ref 135–145)
Total Bilirubin: 0.3 mg/dL (ref 0.3–1.2)
Total Protein: 6.4 g/dL — ABNORMAL LOW (ref 6.5–8.1)

## 2021-01-09 LAB — URINALYSIS, ROUTINE W REFLEX MICROSCOPIC
Bacteria, UA: NONE SEEN
Bilirubin Urine: NEGATIVE
Glucose, UA: NEGATIVE mg/dL
Hgb urine dipstick: NEGATIVE
Ketones, ur: 20 mg/dL — AB
Nitrite: NEGATIVE
Protein, ur: NEGATIVE mg/dL
Specific Gravity, Urine: 1.01 (ref 1.005–1.030)
pH: 6 (ref 5.0–8.0)

## 2021-01-09 LAB — DIFFERENTIAL
Abs Immature Granulocytes: 0.02 10*3/uL (ref 0.00–0.07)
Basophils Absolute: 0 10*3/uL (ref 0.0–0.1)
Basophils Relative: 1 %
Eosinophils Absolute: 0.1 10*3/uL (ref 0.0–0.5)
Eosinophils Relative: 2 %
Immature Granulocytes: 0 %
Lymphocytes Relative: 32 %
Lymphs Abs: 1.7 10*3/uL (ref 0.7–4.0)
Monocytes Absolute: 0.5 10*3/uL (ref 0.1–1.0)
Monocytes Relative: 9 %
Neutro Abs: 3 10*3/uL (ref 1.7–7.7)
Neutrophils Relative %: 56 %

## 2021-01-09 LAB — I-STAT CHEM 8, ED
BUN: 17 mg/dL (ref 6–20)
Calcium, Ion: 1.19 mmol/L (ref 1.15–1.40)
Chloride: 108 mmol/L (ref 98–111)
Creatinine, Ser: 0.5 mg/dL (ref 0.44–1.00)
Glucose, Bld: 93 mg/dL (ref 70–99)
HCT: 40 % (ref 36.0–46.0)
Hemoglobin: 13.6 g/dL (ref 12.0–15.0)
Potassium: 4.4 mmol/L (ref 3.5–5.1)
Sodium: 142 mmol/L (ref 135–145)
TCO2: 24 mmol/L (ref 22–32)

## 2021-01-09 LAB — CBC
HCT: 40.2 % (ref 36.0–46.0)
Hemoglobin: 13.2 g/dL (ref 12.0–15.0)
MCH: 28 pg (ref 26.0–34.0)
MCHC: 32.8 g/dL (ref 30.0–36.0)
MCV: 85.2 fL (ref 80.0–100.0)
Platelets: 227 10*3/uL (ref 150–400)
RBC: 4.72 MIL/uL (ref 3.87–5.11)
RDW: 14.9 % (ref 11.5–15.5)
WBC: 5.4 10*3/uL (ref 4.0–10.5)
nRBC: 0 % (ref 0.0–0.2)

## 2021-01-09 LAB — CBG MONITORING, ED: Glucose-Capillary: 83 mg/dL (ref 70–99)

## 2021-01-09 LAB — APTT: aPTT: 28 seconds (ref 24–36)

## 2021-01-09 LAB — PROTIME-INR
INR: 1 (ref 0.8–1.2)
Prothrombin Time: 12.3 seconds (ref 11.4–15.2)

## 2021-01-09 MED ORDER — METOCLOPRAMIDE HCL 5 MG/ML IJ SOLN
10.0000 mg | Freq: Once | INTRAMUSCULAR | Status: DC
  Filled 2021-01-09: qty 2

## 2021-01-09 MED ORDER — MECLIZINE HCL 25 MG PO TABS: 25.0000 mg | ORAL_TABLET | Freq: Three times a day (TID) | ORAL | 0 refills | Status: AC

## 2021-01-09 MED ORDER — MECLIZINE HCL 25 MG PO TABS
25.0000 mg | ORAL_TABLET | Freq: Once | ORAL | Status: AC
  Administered 2021-01-09: 25 mg via ORAL
  Filled 2021-01-09: qty 1

## 2021-01-09 MED ORDER — SULFAMETHOXAZOLE-TRIMETHOPRIM 800-160 MG PO TABS: 1.0000 | ORAL_TABLET | Freq: Two times a day (BID) | ORAL | 0 refills | Status: AC

## 2021-01-09 MED ORDER — DIPHENHYDRAMINE HCL 50 MG/ML IJ SOLN
25.0000 mg | Freq: Once | INTRAMUSCULAR | Status: DC
  Filled 2021-01-09: qty 1

## 2021-01-09 MED ORDER — METOCLOPRAMIDE HCL 10 MG PO TABS
10.0000 mg | ORAL_TABLET | Freq: Once | ORAL | Status: AC
  Administered 2021-01-09: 10 mg via ORAL
  Filled 2021-01-09: qty 1

## 2021-01-09 NOTE — Discharge Instructions (Signed)
As discussed, your evaluation today has been largely reassuring.  But, it is important that you monitor your condition carefully, and do not hesitate to return to the ED if you develop new, or concerning changes in your condition. ? ?Otherwise, please follow-up with your physician for appropriate ongoing care. ? ?

## 2021-01-09 NOTE — ED Notes (Signed)
Pt refusing IV and blood work. Pt states "she doesn't want any needles." MD aware.

## 2021-01-09 NOTE — ED Triage Notes (Addendum)
Pt BIB EMS via wheelchair.Pt reports since Friday she started having a weakness. Pt reports on Sunday around noon she developed a 10/10 sudden onset headache. Pt also reports she develop dizziness. Pt reports unsteady gait since noon Sunday. Pt reports she is unable to keep her balance when walking.Pt reports that she took tylenol with no relief. Pt reports the dizziness.heache,weakness never went away. Pt reports she decided to call EMS.Pt reports family h/o aneurysms. Pt denies any recent falls , head trauma, cp, sob.

## 2021-01-09 NOTE — ED Provider Notes (Signed)
7:59 AM Care assumed at signout Physical Exam  BP 133/73 (BP Location: Left Arm)   Pulse 72   Temp 97.9 F (36.6 C) (Oral)   Resp 17   SpO2 99%   Physical Exam 7:59 AM Patient awake, alert, speaking clearly, no increased work of breathing, she notes that her symptoms have improved, resolved.  She and I had a lengthy conversation about possible precipitants for her dizziness.  With unremarkable MRI/MRA, CT, no indication for admission.  Similarly with her improvement, some suspicion for vertigo versus complex migraine.  Patient does now note that she has dysuria, given leukocytes in her urine, with an apparently clean sample she will start a course of antibiotics in addition to meclizine.  Patient amenable to, appropriate for discharge.    Gerhard Munch, MD 01/09/21 0800

## 2021-01-09 NOTE — ED Provider Notes (Signed)
[facility] EMERGENCY DEPARTMENT Provider Note   CSN: [id number] Arrival date & time: 01/09/21  0410     History Chief Complaint  Patient presents with  . Headache  . Dizziness  . Weakness    Carrie [name] is a 60 y.o. female history of ovarian cyst who presented with dizziness and headaches and trouble with balance.  Patient states that for the last 2 days she has been having trouble with her balance.  She states that she feels like falling.  Yesterday around noontime she had sudden onset of 10 out of 10 severe headache.  She been taking Tylenol with no relief.  She is concerned because her mother had aneurysms.  She has no personal history of cerebral aneurysm.  She states that she had adverse reaction to IV contrast dye in the past.    The history is provided by the patient.       Past Medical History:  Diagnosis Date  . Ovarian cyst     Patient Active Problem List   Diagnosis Date Noted  . LEUKOCYTOPENIA UNSPECIFIED 11/07/2010  . 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 HYPHEMA 11/07/2010  . 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 OVARIAN CYST 11/07/2010  . NONSPEC ELEVATION OF LEVELS OF TRANSAMINASE/LDH 11/07/2010  . NEPHROLITHIASIS, HX OF 11/07/2010    Past Surgical History:  Procedure Laterality Date  . OVARIAN CYST REMOVAL       OB History   No obstetric history on file.     History reviewed. No pertinent family history.  Social History   Tobacco Use  . Smoking status: Never Smoker  . Smokeless tobacco: Never Used  Substance Use Topics  . Alcohol use: No  . Drug use: No    Home Medications Prior to Admission medications   Medication Sig Start Date End Date Taking? Authorizing Provider  amoxicillin-clavulanate (AUGMENTIN) 875-125 MG tablet Take 1 tablet by mouth every 12 (twelve) hours. 10/10/18   Rodriguez-Southworth, Nettie Elm, PA-C  carbamide peroxide (DEBROX) 6.5 % OTIC solution Place 5 drops into the right ear 2 (two) times daily.  11/01/18   Terrilee Files, MD  ibuprofen (ADVIL,MOTRIN) 600 MG tablet Take 1 tablet (600 mg total) by mouth every 6 (six) hours as needed. 01/23/15   Lurene Shadow, PA-C  OVER THE COUNTER MEDICATION Take 1 capsule by mouth daily. Haw buck thorn    [provider]  predniSONE (DELTASONE) 20 MG tablet Take 1 tablet (20 mg total) by mouth daily with breakfast. 10/10/18   Rodriguez-Southworth, Nettie Elm, PA-C  traMADol (ULTRAM) 50 MG tablet Take 1 tablet (50 mg total) by mouth every 6 (six) hours as needed. 02/26/19   Liberty Handy, PA-C    Allergies    Gluten meal, Lactose intolerance (gi), Other, and Wheat bran  Review of Systems   Review of Systems  Neurological: Positive for dizziness, weakness and headaches.  All other systems reviewed and are negative.   Physical Exam Updated Vital Signs BP (!) 160/89 (BP Location: Left Arm)   Pulse 80   Temp 97.8 F (36.6 C) (Oral)   Resp 20   SpO2 99%   Physical Exam Vitals and nursing note reviewed.  Constitutional:      Comments: Slightly uncomfortable  HENT:     Head: Normocephalic.     Mouth/Throat:     Mouth: Mucous membranes are moist.  Eyes:     Extraocular Movements: Extraocular movements intact.     Pupils: Pupils are equal, round, and reactive to  light.     Comments: No obvious nystagmus   Cardiovascular:     Rate and Rhythm: Normal rate and regular rhythm.  Pulmonary:     Effort: Pulmonary effort is normal.     Breath sounds: Normal breath sounds.  Abdominal:     General: Bowel sounds are normal.     Palpations: Abdomen is soft.  Musculoskeletal:        General: Normal range of motion.     Cervical back: Normal range of motion and neck supple.  Skin:    General: Skin is warm.  Neurological:     Mental Status: She is alert and oriented to person, place, and time.     Comments: Patient has no obvious dysmetria.  Patient has no obvious facial droop.  Patient has normal finger-to-nose bilaterally.  Patient has  slightly wide-based gait  Psychiatric:        Mood and Affect: Mood normal.        Behavior: Behavior normal.     ED Results / Procedures / Treatments   Labs (all labs ordered are listed, but only abnormal results are displayed) Labs Reviewed  COMPREHENSIVE METABOLIC PANEL - Abnormal; Notable for the following components:      Result Value   Calcium 8.8 (*)    Total Protein 6.4 (*)    All other components within normal limits  URINALYSIS, ROUTINE W REFLEX MICROSCOPIC - Abnormal; Notable for the following components:   Color, Urine STRAW (*)    Ketones, ur 20 (*)    Leukocytes,Ua SMALL (*)    All other components within normal limits  PROTIME-INR  APTT  CBC  DIFFERENTIAL  I-STAT CHEM 8, ED  CBG MONITORING, ED  TROPONIN I (HIGH SENSITIVITY)    EKG EKG Interpretation  Date/Time:  Monday January 09 2021 04:15:58 EDT Ventricular Rate:  76 PR Interval:  164 QRS Duration: 70 QT Interval:  380 QTC Calculation: 427 R Axis:   0 Text Interpretation: Normal sinus rhythm Low voltage QRS Cannot rule out Anterior infarct , age undetermined Abnormal ECG No significant change since last tracing Confirmed by Richardean Canal [phone]) on 01/09/2021 5:16:40 AM   Radiology CT HEAD WO CONTRAST  Result Date: 01/09/2021 CLINICAL DATA:  60 year old female with headache, dizziness. EXAM: CT HEAD WITHOUT CONTRAST TECHNIQUE: Contiguous axial images were obtained from the base of the skull through the vertex without intravenous contrast. COMPARISON:  Brain MRI 07/22/2010.  Head CT 10/24/2005. FINDINGS: Brain: Cerebral volume remains normal. No midline shift, ventriculomegaly, mass effect, evidence of mass lesion, intracranial hemorrhage or evidence of cortically based acute infarction. Gray-white matter differentiation is within normal limits throughout the brain. Vascular: No suspicious intracranial vascular hyperdensity. Skull: Stable, negative. Sinuses/Orbits: Visualized paranasal sinuses and mastoids  are stable and well pneumatized. Tympanic cavities are clear. Other: Visualized orbits and scalp soft tissues are within normal limits. IMPRESSION: Normal for age noncontrast Head CT. Electronically Signed   By: Odessa Fleming M.D.   On: 01/09/2021 05:36   DG Chest Port 1 View  Result Date: 01/09/2021 CLINICAL DATA:  60 year old female with sudden onset headache, weakness, dizziness. EXAM: PORTABLE CHEST 1 VIEW COMPARISON:  Chest radiographs 10/10/2018 and earlier. FINDINGS: Portable AP upright view at 0533 hours. Mildly lower lung volumes. Mediastinal contours are stable and within normal limits. Visualized tracheal air column is within normal limits. Mild linear subpleural atelectasis suspected in the left lung. Otherwise when Allowing for portable technique the lungs are clear. No pneumothorax or effusion. Paucity  of bowel gas in the upper abdomen. No acute osseous abnormality identified. IMPRESSION: Lower lung volumes.  No acute cardiopulmonary abnormality. Electronically Signed   By: Odessa Fleming M.D.   On: 01/09/2021 05:47    Procedures Procedures   Medications Ordered in ED Medications  meclizine (ANTIVERT) tablet 25 mg (has no administration in time range)  metoCLOPramide (REGLAN) tablet 10 mg (has no administration in time range)    ED Course  I have reviewed the triage vital signs and the nursing notes.  Pertinent labs & imaging results that were available during my care of the patient were reviewed by me and considered in my medical decision making (see chart for details).    MDM Rules/Calculators/A&P                         GERTRUE WILLETTE is a 61 y.o. female here presenting with headache and dizziness.  Dizziness for 2 days and headache since yesterday.  She has family history of cerebral aneurysm.  Initially was planning to get CTA to rule out aneurysm but patient apparently has contrast dye allergy.  Also consideration for possible posterior circulation stroke.  Will get noncontrast CT  head and MRI brain and MRA brain.  Will give migraine cocktail and meclizine.  Patient refused IV.  7:08 AM Labs unremarkable.  CT head did not show any bleed.  MRI and MRA pending.  Signed out to Dr. Jeraldine Loots and anticipate if MRI and MRA did not show anything, patient will be discharged.   Final Clinical Impression(s) / ED Diagnoses Final diagnoses:  None    Rx / DC Orders ED Discharge Orders    None       Charlynne Pander, MD 01/09/21 [phone]

## 2021-01-26 ENCOUNTER — Telehealth: Payer: Self-pay

## 2021-01-26 NOTE — Telephone Encounter (Signed)
Received email from Pollie Friar ([employer] [facility]) to contact patient about the No Surprise Act estimate that was sent. Left Pt VM that it was just an estimate and nothing was due upfront. In [place] email it also stated Pt was unemployed and couldn't afford to pay the estimate. Explain that pt should call back to [phone] to get schedule with a PCP and then she could about for [employer] to assist with medical bills.

## 2021-02-23 ENCOUNTER — Inpatient Hospital Stay: Payer: Self-pay | Admitting: Internal Medicine

## 2021-08-15 ENCOUNTER — Encounter (HOSPITAL_COMMUNITY): Payer: Self-pay | Admitting: Emergency Medicine

## 2021-08-15 ENCOUNTER — Emergency Department (HOSPITAL_COMMUNITY)
Admission: EM | Admit: 2021-08-15 | Discharge: 2021-08-15 | Disposition: A | Discharge status: Home / Self Care | Payer: Self-pay | Attending: Emergency Medicine | Admitting: Emergency Medicine

## 2021-08-15 DIAGNOSIS — Z5321 Procedure and treatment not carried out due to patient leaving prior to being seen by health care provider: Secondary | ICD-10-CM | POA: Insufficient documentation

## 2021-08-15 DIAGNOSIS — R112 Nausea with vomiting, unspecified: Secondary | ICD-10-CM | POA: Insufficient documentation

## 2021-08-15 DIAGNOSIS — R42 Dizziness and giddiness: Secondary | ICD-10-CM | POA: Insufficient documentation

## 2021-08-15 DIAGNOSIS — R531 Weakness: Secondary | ICD-10-CM | POA: Insufficient documentation

## 2021-08-15 DIAGNOSIS — R197 Diarrhea, unspecified: Secondary | ICD-10-CM | POA: Insufficient documentation

## 2021-08-15 LAB — BASIC METABOLIC PANEL
Anion gap: 9 (ref 5–15)
BUN: 7 mg/dL (ref 6–20)
CO2: 23 mmol/L (ref 22–32)
Calcium: 8.7 mg/dL — ABNORMAL LOW (ref 8.9–10.3)
Chloride: 108 mmol/L (ref 98–111)
Creatinine, Ser: 0.69 mg/dL (ref 0.44–1.00)
GFR, Estimated: >60 mL/min (ref >60)
Glucose, Bld: 101 mg/dL — ABNORMAL HIGH (ref 70–99)
Potassium: 3.3 mmol/L — ABNORMAL LOW (ref 3.5–5.1)
Sodium: 140 mmol/L (ref 135–145)

## 2021-08-15 LAB — CBC WITH DIFFERENTIAL/PLATELET
Abs Immature Granulocytes: 0.01 10*3/uL (ref 0.00–0.07)
Basophils Absolute: 0 10*3/uL (ref 0.0–0.1)
Basophils Relative: 1 %
Eosinophils Absolute: 0 10*3/uL (ref 0.0–0.5)
Eosinophils Relative: 1 %
HCT: 39.3 % (ref 36.0–46.0)
Hemoglobin: 12.7 g/dL (ref 12.0–15.0)
Immature Granulocytes: 0 %
Lymphocytes Relative: 20 %
Lymphs Abs: 1 10*3/uL (ref 0.7–4.0)
MCH: 27.5 pg (ref 26.0–34.0)
MCHC: 32.3 g/dL (ref 30.0–36.0)
MCV: 85.1 fL (ref 80.0–100.0)
Monocytes Absolute: 0.4 10*3/uL (ref 0.1–1.0)
Monocytes Relative: 9 %
Neutro Abs: 3.4 10*3/uL (ref 1.7–7.7)
Neutrophils Relative %: 69 %
Platelets: 224 10*3/uL (ref 150–400)
RBC: 4.62 MIL/uL (ref 3.87–5.11)
RDW: 14.6 % (ref 11.5–15.5)
WBC: 4.9 10*3/uL (ref 4.0–10.5)
nRBC: 0 % (ref 0.0–0.2)

## 2021-08-15 LAB — LIPASE, BLOOD: Lipase: 28 U/L (ref 11–51)

## 2021-08-15 MED ORDER — ONDANSETRON 4 MG PO TBDP: 4.0000 mg | ORAL_TABLET | Freq: Once | ORAL | Status: DC

## 2021-08-15 NOTE — ED Notes (Signed)
Called for vitals no answer °

## 2021-08-15 NOTE — ED Notes (Signed)
Called pt 3x, no response.  

## 2021-08-15 NOTE — ED Triage Notes (Signed)
Pt endorses generalized weakness and N/V for a few days that worsened today. 2 episodes of diarrhea. Some lightheadedness.

## 2021-08-15 NOTE — ED Provider Notes (Signed)
Emergency Medicine Provider Triage Evaluation Note  Carrie [name] , a 60 y.o. female  was evaluated in triage.  Pt complains of worsened pain weakness, nausea and vomiting over the past 4 to 5 days.  Also with diarrhea over the last 2 days.  Review of Systems  Positive: Nausea vomiting diarrhea, weakness Negative: Chest pain or shortness of breath  Physical Exam  BP 137/89 (BP Location: Left Arm)   Pulse (!) 103   Temp 99.3 F (37.4 C) (Oral)   Resp 16   SpO2 96%  Gen:   Awake, no distress   Resp:  Normal effort  MSK:   Moves extremities without difficulty  Other:    Medical Decision Making  Medically screening exam initiated at 8:56 AM.  Appropriate orders placed.  Carrie [name] was informed that the remainder of the evaluation will be completed by another provider, this initial triage assessment does not replace that evaluation, and the importance of remaining in the ED until their evaluation is complete.     Saddie Benders, PA-C 08/15/21 [id number]    Virgina Norfolk, DO 08/15/21 [phone]

## 2021-08-15 NOTE — ED Notes (Signed)
Called for vitals, no response ?

## 2021-11-30 ENCOUNTER — Ambulatory Visit (INDEPENDENT_AMBULATORY_CARE_PROVIDER_SITE_OTHER): Payer: Self-pay

## 2021-11-30 ENCOUNTER — Ambulatory Visit (HOSPITAL_COMMUNITY)
Admission: EM | Admit: 2021-11-30 | Discharge: 2021-11-30 | Disposition: A | Discharge status: Home / Self Care | Payer: Self-pay | Attending: Internal Medicine | Admitting: Internal Medicine

## 2021-11-30 ENCOUNTER — Encounter (HOSPITAL_COMMUNITY): Payer: Self-pay | Admitting: Emergency Medicine

## 2021-11-30 DIAGNOSIS — M25562 Pain in left knee: Secondary | ICD-10-CM

## 2021-11-30 DIAGNOSIS — M1712 Unilateral primary osteoarthritis, left knee: Secondary | ICD-10-CM

## 2021-11-30 HISTORY — DX: Calculus of kidney: N20.0

## 2021-11-30 HISTORY — DX: Pure hypercholesterolemia, unspecified: E78.00

## 2021-11-30 HISTORY — DX: Unspecified osteoarthritis, unspecified site: M19.90

## 2021-11-30 MED ORDER — PREDNISONE 20 MG PO TABS: 20.0000 mg | ORAL_TABLET | Freq: Every day | ORAL | 0 refills | Status: AC

## 2021-11-30 MED ORDER — MELOXICAM 7.5 MG PO TABS: 7.5000 mg | ORAL_TABLET | Freq: Every day | ORAL | 0 refills | Status: DC

## 2021-11-30 NOTE — ED Triage Notes (Signed)
Pt had pain in left knee since August last year. Unsure if arthritis since have it other places. Denies injuries. Reports certain positions and at night it will hurt more.

## 2021-11-30 NOTE — Discharge Instructions (Addendum)
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

## 2021-12-01 NOTE — ED Provider Notes (Signed)
[facility]    CSN: [id number] Arrival date & time: 11/30/21  1252      History   Chief Complaint Chief Complaint  Patient presents with   Knee Pain    HPI Carrie [name] is a 61 y.o. female comes to the urgent care with several months history of left knee pain.  Pain is sharp, throbbing and of moderate severity.  Pain is aggravated by movement and seems to improve when the patient lays down at night.  Patient denies any relieving factors.  She uses a knee brace with some improvement.  She has some knee stiffness especially in the mornings.  She denies any significant swelling of the right knee.  No bruising of the left knee.  No falls or buckling of the knee.  Patient has tried anti-inflammatories with no improvement in symptoms.Marland Kitchen   HPI  Past Medical History:  Diagnosis Date   Arthritis    High cholesterol    Kidney stones    Ovarian cyst     Patient Active Problem List   Diagnosis Date Noted   LEUKOCYTOPENIA UNSPECIFIED 11/07/2010   DYSLEXIA 11/07/2010   HYPHEMA 11/07/2010   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/07/2010   OVARIAN CYST 11/07/2010   NONSPEC ELEVATION OF LEVELS OF TRANSAMINASE/LDH 11/07/2010   NEPHROLITHIASIS, HX OF 11/07/2010    Past Surgical History:  Procedure Laterality Date   OVARIAN CYST REMOVAL      OB History   No obstetric history on file.      Home Medications    Prior to Admission medications   Medication Sig Start Date End Date Taking? Authorizing Provider  meloxicam (MOBIC) 7.5 MG tablet Take 1 tablet (7.5 mg total) by mouth daily. 11/30/21  Yes Keiarah Orlowski, Britta Mccreedy, MD  predniSONE (DELTASONE) 20 MG tablet Take 1 tablet (20 mg total) by mouth daily for 5 days. 11/30/21 12/05/21 Yes Lus Kriegel, Britta Mccreedy, MD  carbamide peroxide (DEBROX) 6.5 % OTIC solution Place 5 drops into the right ear 2 (two) times daily. 11/01/18   Terrilee Files, MD  ibuprofen (ADVIL,MOTRIN) 600 MG tablet Take 1 tablet (600 mg total) by mouth every 6 (six)  hours as needed. 01/23/15   Lurene Shadow, PA-C  OVER THE COUNTER MEDICATION Take 1 capsule by mouth daily. Haw buck thorn    [provider]  traMADol (ULTRAM) 50 MG tablet Take 1 tablet (50 mg total) by mouth every 6 (six) hours as needed. 02/26/19   Liberty Handy, PA-C    Family History No family history on file.  Social History Social History   Tobacco Use   Smoking status: Never   Smokeless tobacco: Never  Substance Use Topics   Alcohol use: No   Drug use: No     Allergies   Gluten meal, Lactose intolerance (gi), Other, Wheat bran, and Cephalexin   Review of Systems Review of Systems  Musculoskeletal:  Positive for arthralgias. Negative for back pain, gait problem and joint swelling.    Physical Exam Triage Vital Signs ED Triage Vitals  Enc Vitals Group     BP 11/30/21 1549 111/74     Pulse Rate 11/30/21 1549 84     Resp 11/30/21 1549 19     Temp 11/30/21 1549 98.1 F (36.7 C)     Temp Source 11/30/21 1549 Oral     SpO2 11/30/21 1549 96 %     Weight --      Height --      Head Circumference --  Peak Flow --      Pain Score 11/30/21 1545 6     Pain Loc --      Pain Edu? --      Excl. in GC? --    No data found.  Updated Vital Signs BP 111/74 (BP Location: Left Arm)    Pulse 84    Temp 98.1 F (36.7 C) (Oral)    Resp 19    SpO2 96%   Visual Acuity Right Eye Distance:   Left Eye Distance:   Bilateral Distance:    Right Eye Near:   Left Eye Near:    Bilateral Near:     Physical Exam Vitals and nursing note reviewed.  Constitutional:      Appearance: Normal appearance.  Cardiovascular:     Rate and Rhythm: Normal rate and regular rhythm.     Pulses: Normal pulses.     Heart sounds: Normal heart sounds.  Abdominal:     General: Abdomen is flat. Bowel sounds are normal.  Musculoskeletal:        General: No swelling, tenderness or deformity. Normal range of motion.  Skin:    General: Skin is warm.     Findings: No bruising or  erythema.  Neurological:     General: No focal deficit present.     Mental Status: She is alert.     UC Treatments / Results  Labs (all labs ordered are listed, but only abnormal results are displayed) Labs Reviewed - No data to display  EKG   Radiology DG Knee Complete 4 Views Left  Result Date: 11/30/2021 CLINICAL DATA:  Left knee pain for the past 6 months. No reported injury. EXAM: LEFT KNEE - COMPLETE 4+ VIEW COMPARISON:  None. FINDINGS: Mild-to-moderate medial joint space narrowing and associated spur formation. Mild lateral and patellofemoral spur formation. No fracture, dislocation or effusion. IMPRESSION: Tricompartmental degenerative changes, as described above. Electronically Signed   By: Beckie Salts M.D.   On: 11/30/2021 16:24    Procedures Procedures (including critical care time)  Medications Ordered in UC Medications - No data to display  Initial Impression / Assessment and Plan / UC Course  I have reviewed the triage vital signs and the nursing notes.  Pertinent labs & imaging results that were available during my care of the patient were reviewed by me and considered in my medical decision making (see chart for details).     1.  Osteoarthritis of the left knee: X-ray of the left knee is remarkable for tricompartmental degenerative changes Knee brace is recommended Short course of prednisone Meloxicam 7.5 mg daily Return to urgent care if symptoms worsen. Final Clinical Impressions(s) / UC Diagnoses   Final diagnoses:  Primary osteoarthritis of left knee     Discharge Instructions      Gentle range of motion exercises Take medications as prescribed Your x-ray shows arthritis of your left knee Continue using a knee brace Quadricep strengthening exercises will help with knee pain and stiffness    ED Prescriptions     Medication Sig Dispense Auth. Provider   predniSONE (DELTASONE) 20 MG tablet Take 1 tablet (20 mg total) by mouth daily for 5  days. 5 tablet Louise Victory, Britta Mccreedy, MD   meloxicam (MOBIC) 7.5 MG tablet Take 1 tablet (7.5 mg total) by mouth daily. 30 tablet Brinlyn Cena, Britta Mccreedy, MD      PDMP not reviewed this encounter.   Merrilee Jansky, MD 12/01/21 1115

## 2022-01-27 ENCOUNTER — Emergency Department (HOSPITAL_COMMUNITY): Payer: Medicaid Other

## 2022-01-27 ENCOUNTER — Emergency Department (HOSPITAL_COMMUNITY): Payer: Self-pay

## 2022-01-27 ENCOUNTER — Encounter (HOSPITAL_COMMUNITY): Payer: Self-pay

## 2022-01-27 ENCOUNTER — Other Ambulatory Visit: Payer: Self-pay

## 2022-01-27 ENCOUNTER — Inpatient Hospital Stay (HOSPITAL_COMMUNITY): Payer: Self-pay

## 2022-01-27 ENCOUNTER — Inpatient Hospital Stay (HOSPITAL_COMMUNITY)
Admission: EM | Admit: 2022-01-27 | Discharge: 2022-01-31 | DRG: 175 | Disposition: A | Discharge status: Home / Self Care | Payer: Self-pay | Attending: Internal Medicine | Admitting: Internal Medicine

## 2022-01-27 ENCOUNTER — Inpatient Hospital Stay (HOSPITAL_COMMUNITY): Payer: Medicaid Other

## 2022-01-27 DIAGNOSIS — I248 Other forms of acute ischemic heart disease: Secondary | ICD-10-CM | POA: Diagnosis present

## 2022-01-27 DIAGNOSIS — R7989 Other specified abnormal findings of blood chemistry: Secondary | ICD-10-CM | POA: Diagnosis present

## 2022-01-27 DIAGNOSIS — R778 Other specified abnormalities of plasma proteins: Secondary | ICD-10-CM

## 2022-01-27 DIAGNOSIS — Z20822 Contact with and (suspected) exposure to covid-19: Secondary | ICD-10-CM | POA: Diagnosis present

## 2022-01-27 DIAGNOSIS — Z79899 Other long term (current) drug therapy: Secondary | ICD-10-CM

## 2022-01-27 DIAGNOSIS — R0789 Other chest pain: Secondary | ICD-10-CM

## 2022-01-27 DIAGNOSIS — I2699 Other pulmonary embolism without acute cor pulmonale: Secondary | ICD-10-CM

## 2022-01-27 DIAGNOSIS — I2609 Other pulmonary embolism with acute cor pulmonale: Principal | ICD-10-CM

## 2022-01-27 DIAGNOSIS — M170 Bilateral primary osteoarthritis of knee: Secondary | ICD-10-CM | POA: Diagnosis present

## 2022-01-27 DIAGNOSIS — E876 Hypokalemia: Secondary | ICD-10-CM | POA: Diagnosis present

## 2022-01-27 DIAGNOSIS — E669 Obesity, unspecified: Secondary | ICD-10-CM | POA: Diagnosis present

## 2022-01-27 DIAGNOSIS — Z6836 Body mass index (BMI) 36.0-36.9, adult: Secondary | ICD-10-CM

## 2022-01-27 DIAGNOSIS — E739 Lactose intolerance, unspecified: Secondary | ICD-10-CM | POA: Diagnosis present

## 2022-01-27 DIAGNOSIS — I82451 Acute embolism and thrombosis of right peroneal vein: Secondary | ICD-10-CM | POA: Diagnosis present

## 2022-01-27 DIAGNOSIS — R0602 Shortness of breath: Secondary | ICD-10-CM

## 2022-01-27 DIAGNOSIS — E7800 Pure hypercholesterolemia, unspecified: Secondary | ICD-10-CM | POA: Diagnosis present

## 2022-01-27 DIAGNOSIS — I471 Supraventricular tachycardia: Secondary | ICD-10-CM | POA: Diagnosis present

## 2022-01-27 DIAGNOSIS — R000 Tachycardia, unspecified: Secondary | ICD-10-CM | POA: Diagnosis present

## 2022-01-27 DIAGNOSIS — J9601 Acute respiratory failure with hypoxia: Secondary | ICD-10-CM | POA: Diagnosis present

## 2022-01-27 DIAGNOSIS — R001 Bradycardia, unspecified: Secondary | ICD-10-CM | POA: Diagnosis not present

## 2022-01-27 DIAGNOSIS — Z91041 Radiographic dye allergy status: Secondary | ICD-10-CM

## 2022-01-27 LAB — BASIC METABOLIC PANEL
Anion gap: 11 (ref 5–15)
BUN: 16 mg/dL (ref 6–20)
CO2: 21 mmol/L — ABNORMAL LOW (ref 22–32)
Calcium: 9 mg/dL (ref 8.9–10.3)
Chloride: 107 mmol/L (ref 98–111)
Creatinine, Ser: 0.68 mg/dL (ref 0.44–1.00)
GFR, Estimated: >60 mL/min (ref >60)
Glucose, Bld: 98 mg/dL (ref 70–99)
Potassium: 4 mmol/L (ref 3.5–5.1)
Sodium: 139 mmol/L (ref 135–145)

## 2022-01-27 LAB — PROTIME-INR
INR: 1 (ref 0.8–1.2)
Prothrombin Time: 13.3 seconds (ref 11.4–15.2)

## 2022-01-27 LAB — GLUCOSE, CAPILLARY: Glucose-Capillary: 120 mg/dL — ABNORMAL HIGH (ref 70–99)

## 2022-01-27 LAB — TROPONIN I (HIGH SENSITIVITY)
Troponin I (High Sensitivity): 187 ng/L (ref <18)
Troponin I (High Sensitivity): 278 ng/L (ref <18)
Troponin I (High Sensitivity): 423 ng/L (ref <18)

## 2022-01-27 LAB — CBC
HCT: 44.3 % (ref 36.0–46.0)
Hemoglobin: 13.7 g/dL (ref 12.0–15.0)
MCH: 26.9 pg (ref 26.0–34.0)
MCHC: 30.9 g/dL (ref 30.0–36.0)
MCV: 86.9 fL (ref 80.0–100.0)
Platelets: 120 10*3/uL — ABNORMAL LOW (ref 150–400)
RBC: 5.1 MIL/uL (ref 3.87–5.11)
RDW: 15.2 % (ref 11.5–15.5)
WBC: 5.5 10*3/uL (ref 4.0–10.5)
nRBC: 0 % (ref 0.0–0.2)

## 2022-01-27 LAB — ECHOCARDIOGRAM COMPLETE
Area-P 1/2: 4.36 cm2
Height: 63 in
S' Lateral: 2 cm
Weight: 3280 oz

## 2022-01-27 LAB — D-DIMER, QUANTITATIVE: D-Dimer, Quant: 11.3 ug/mL-FEU — ABNORMAL HIGH (ref 0.00–0.50)

## 2022-01-27 LAB — BRAIN NATRIURETIC PEPTIDE: B Natriuretic Peptide: 191.4 pg/mL — ABNORMAL HIGH (ref 0.0–100.0)

## 2022-01-27 LAB — HIV ANTIBODY (ROUTINE TESTING W REFLEX): HIV Screen 4th Generation wRfx: NONREACTIVE

## 2022-01-27 LAB — I-STAT BETA HCG BLOOD, ED (MC, WL, AP ONLY): I-stat hCG, quantitative: <5 m[IU]/mL (ref <5)

## 2022-01-27 LAB — HEPARIN LEVEL (UNFRACTIONATED): Heparin Unfractionated: 0.66 IU/mL (ref 0.30–0.70)

## 2022-01-27 LAB — RESP PANEL BY RT-PCR (FLU A&B, COVID) ARPGX2
Influenza A by PCR: NEGATIVE
Influenza B by PCR: NEGATIVE
SARS Coronavirus 2 by RT PCR: NEGATIVE

## 2022-01-27 MED ORDER — ASPIRIN 81 MG PO CHEW
324.0000 mg | CHEWABLE_TABLET | Freq: Once | ORAL | Status: AC
Start: 2022-01-27 — End: 2022-01-27
  Administered 2022-01-27: 324 mg via ORAL
  Filled 2022-01-27: qty 4

## 2022-01-27 MED ORDER — ACETAMINOPHEN 650 MG RE SUPP: 650.0000 mg | Freq: Four times a day (QID) | RECTAL | Status: DC | PRN

## 2022-01-27 MED ORDER — SODIUM CHLORIDE 0.9 % IV BOLUS: 500.0000 mL | Freq: Once | INTRAVENOUS | Status: DC

## 2022-01-27 MED ORDER — MELOXICAM 7.5 MG PO TABS
7.5000 mg | ORAL_TABLET | Freq: Every day | ORAL | Status: DC
  Administered 2022-01-27 – 2022-01-29 (×3): 7.5 mg via ORAL
  Filled 2022-01-27 (×6): qty 1

## 2022-01-27 MED ORDER — BISACODYL 5 MG PO TBEC: 5.0000 mg | DELAYED_RELEASE_TABLET | Freq: Every day | ORAL | Status: DC | PRN

## 2022-01-27 MED ORDER — DILTIAZEM HCL ER COATED BEADS 120 MG PO CP24
120.0000 mg | ORAL_CAPSULE | Freq: Every day | ORAL | Status: DC
Start: 2022-01-27 — End: 2022-01-30
  Administered 2022-01-27 – 2022-01-29 (×3): 120 mg via ORAL
  Filled 2022-01-27 (×4): qty 1

## 2022-01-27 MED ORDER — OXYCODONE HCL 5 MG PO TABS: 5.0000 mg | ORAL_TABLET | ORAL | Status: DC | PRN

## 2022-01-27 MED ORDER — HEPARIN BOLUS VIA INFUSION
1500.0000 [IU] | Freq: Once | INTRAVENOUS | Status: AC
  Administered 2022-01-27: 1500 [IU] via INTRAVENOUS
  Filled 2022-01-27: qty 1500

## 2022-01-27 MED ORDER — ACETAMINOPHEN 325 MG PO TABS
650.0000 mg | ORAL_TABLET | Freq: Four times a day (QID) | ORAL | Status: DC | PRN
  Administered 2022-01-29: 650 mg via ORAL
  Filled 2022-01-27: qty 2

## 2022-01-27 MED ORDER — HEPARIN BOLUS VIA INFUSION
4000.0000 [IU] | Freq: Once | INTRAVENOUS | Status: AC
Start: 2022-01-27 — End: 2022-01-27
  Administered 2022-01-27: 4000 [IU] via INTRAVENOUS
  Filled 2022-01-27: qty 4000

## 2022-01-27 MED ORDER — HEPARIN (PORCINE) 25000 UT/250ML-% IV SOLN
1200.0000 [IU]/h | INTRAVENOUS | Status: DC
Start: 2022-01-27 — End: 2022-01-30
  Administered 2022-01-27: 900 [IU]/h via INTRAVENOUS
  Administered 2022-01-28 (×2): 1300 [IU]/h via INTRAVENOUS
  Administered 2022-01-29: 1200 [IU]/h via INTRAVENOUS
  Filled 2022-01-27 (×4): qty 250

## 2022-01-27 MED ORDER — IPRATROPIUM-ALBUTEROL 0.5-2.5 (3) MG/3ML IN SOLN
3.0000 mL | Freq: Once | RESPIRATORY_TRACT | Status: AC
  Administered 2022-01-27: 3 mL via RESPIRATORY_TRACT
  Filled 2022-01-27: qty 3

## 2022-01-27 NOTE — Progress Notes (Signed)
Echocardiogram ?2D Echocardiogram has been performed. ? ?Carrie [name] Carrie [name] RDCS ?01/27/2022, 1:13 PM ? ?Dr Eden Emms notified of stat ?

## 2022-01-27 NOTE — Progress Notes (Signed)
ANTICOAGULATION CONSULT NOTE - Initial Consult ? ?Pharmacy Consult for Heparin ?Indication: pulmonary embolus ? ?Allergies  ?Allergen Reactions  ? Iodinated Contrast Media Anaphylaxis  ? Gluten Meal Nausea Only  ?  Makes stomach hurt also  ? Lactose Intolerance (Gi) Diarrhea and Nausea Only  ?  Makes stomach hurt also  ? Other Swelling and Rash  ?  Radioactive dyes, heavy metals like nickel  ? Wheat Bran Diarrhea and Nausea Only  ?  Makes stomach hurt also  ? Cephalexin Itching  ? ? ?Patient Measurements: ?Height: 5\' 3"  (160 cm) ?Weight: 93 kg (205 lb) ?IBW/kg (Calculated) : 52.4 ?Heparin Dosing Weight: 73.7 kg ? ?Vital Signs: ?Temp: 97.8 ?F (36.6 ?C) (04/08 04-08-2003) ?Temp Source: Rectal (04/08 04-08-2003) ?BP: 110/69 (04/08 1305) ?Pulse Rate: 97 (04/08 1305) ? ?Labs: ?Recent Labs  ?  01/27/22 ?0853 01/27/22 ?1031 01/27/22 ?1134  ?HGB 13.7  --   --   ?HCT 44.3  --   --   ?PLT 120*  --   --   ?LABPROT  --   --  13.3  ?INR  --   --  1.0  ?CREATININE 0.68  --   --   ?TROPONINIHS 187* 278*  --   ? ? ? ?Estimated Creatinine Clearance: 81 mL/min (by C-G formula based on SCr of 0.68 mg/dL). ? ? ?Medical History: ?Past Medical History:  ?Diagnosis Date  ? Arthritis   ? High cholesterol   ? Kidney stones   ? Ovarian cyst   ? ? ?Medications:  ?(Not in a hospital admission) ? ?Scheduled:  ?Infusions:  ?PRN:  ? ?Assessment: ?31 yof with a history of Arthritis, HLD. Patient is presenting with chest pain and SOB. Heparin initially started for ACS. Request from EDP to change heparin dosing to dosing for pulmonary embolus. ? ?Patient is not on anticoagulation prior to arrival. ? ?Hgb 13.7; plt 120 ? ?Goal of Therapy:  ?Heparin level 0.3-0.7 units/ml ?Monitor platelets by anticoagulation protocol: Yes ?  ?Plan:  ?Give additional 1500 units bolus x 1 ?Increase heparin infusion to 1300 units/hr ?Check anti-Xa level in 6 hours and daily while on heparin ?Continue to monitor H&H and platelets ? ?67, PharmD, BCPS ?01/27/2022 1:29  PM ?ED Clinical Pharmacist -  [phone] ? ? ? ?

## 2022-01-27 NOTE — ED Notes (Signed)
Pt states she is on low sodium diet. She had individual vegetarian chili that was high in sodium which she feels contribute to her swelling.  ?

## 2022-01-27 NOTE — ED Notes (Signed)
Pt declined to go to nuclear medicine d/t radiation exposure. RN notified Wynetta Fines MD. ? ?Wynetta Fines MD stated he went through benefits and risks w/ pt. ?

## 2022-01-27 NOTE — ED Triage Notes (Signed)
Pt c/o cp 8/10 pain. Pt complains of heaviness, sob, and weakness. Pt noticed her legs have been swelling for the past few days. Pt states she has been around her aunt who tested positive for Covid last week.  ?

## 2022-01-27 NOTE — Consult Note (Addendum)
?Cardiology Consultation:  ? ?Patient ID: Carrie [name] ?MRN: [id number]; DOB: 02/14/1961 ? ?Admit date: 01/27/2022 ?Date of Consult: 01/27/2022 ? ?PCP:  Pcp, No ?  ?[facility] Prov[facility] ?Cardiologist: New to [facility] ? ?Patient Profile:  ? ?Carrie [name] is a 61 y.o. female with a hx of HLD (not currently on medical therapy) and nephrolithiasis who is being seen 01/27/2022 for the evaluation of SVT at the request of Dr. Dalene Seltzer. ? ?History of Present Illness:  ? ?Carrie [name] presented to [facility] ED on 01/27/2022 for evaluation of chest pain, dyspnea and weakness. She reports having a cold for the past week and was taking decongestants with minimal improvement. Over the past 3 days, she reports constant chest pain at rest or with activity. Also worse with taking a deep breath. She has also been more short of breath and this worsens with positional changes. No specific orthopnea or PND. She reports chronic lower extremity edema.  ? ?Initial labs showed WBC 5.5, Hgb 13.7, platelets 120, Na+ 139, K+ 4.0 and creatinine 0.68. Initial Hs Troponin 187 with repeat of 278. D-dimer 11.30. INR 1.0. CXR with no active disease. VQ Scan pending (patient refused CT due to contrast allergy and radiation exposure). Her initial EKG showed NSR, HR 99 with atrial enlargement. While in the ED, she went into SVT with HR in the 150's which was confirmed by EKG at 1044. Of note, she did receive an Albuterol nebulizer at 1014. She had converted back to NSR by repeat EKG at 1050. ? ?She reports she was aware of her arrhythmia and felt associated palpitations at that time. Symptoms resolved with vagal maneuvers. She does report having palpitations in the past which spontaneously resolve but no formal diagnosis of any cardiac arrhythmias.  ? ?Past Medical History:  ?Diagnosis Date  ? Arthritis   ? High cholesterol   ? Kidney stones   ? Ovarian cyst   ? ? ?Past Surgical History:  ?Procedure Laterality Date  ? OVARIAN CYST REMOVAL    ?  ? ?Home  Medications:  ?Prior to Admission medications   ?Medication Sig Start Date End Date Taking? Authorizing Provider  ?carbamide peroxide (DEBROX) 6.5 % OTIC solution Place 5 drops into the right ear 2 (two) times daily. 11/01/18  Yes Terrilee Files, MD  ?meloxicam (MOBIC) 7.5 MG tablet Take 1 tablet (7.5 mg total) by mouth daily. 11/30/21  Yes Lamptey, Britta Mccreedy, MD  ?ibuprofen (ADVIL,MOTRIN) 600 MG tablet Take 1 tablet (600 mg total) by mouth every 6 (six) hours as needed. ?Patient not taking: Reported on 01/27/2022 01/23/15   Lurene Shadow, PA-C  ?traMADol (ULTRAM) 50 MG tablet Take 1 tablet (50 mg total) by mouth every 6 (six) hours as needed. ?Patient not taking: Reported on 01/27/2022 02/26/19   Liberty Handy, PA-C  ? ? ?Inpatient Medications: ?Scheduled Meds: ? diltiazem  120 mg Oral Daily  ? heparin  1,500 Units Intravenous Once  ? ?Continuous Infusions: ? heparin 900 Units/hr (01/27/22 1245)  ? ?PRN Meds: ? ? ?Allergies:    ?Allergies  ?Allergen Reactions  ? Iodinated Contrast Media Anaphylaxis  ? Gluten Meal Nausea Only  ?  Makes stomach hurt also  ? Lactose Intolerance (Gi) Diarrhea and Nausea Only  ?  Makes stomach hurt also  ? Other Swelling and Rash  ?  Radioactive dyes, heavy metals like nickel  ? Wheat Bran Diarrhea and Nausea Only  ?  Makes stomach hurt also  ? Cephalexin Itching  ? ? ?Social  History:   ?Social History  ? ?Socioeconomic History  ? Marital status: Single  ?  Spouse name: Not on file  ? Number of children: Not on file  ? Years of education: Not on file  ? Highest education level: Not on file  ?Occupational History  ? Not on file  ?Tobacco Use  ? Smoking status: Never  ? Smokeless tobacco: Never  ?Substance and Sexual Activity  ? Alcohol use: No  ? Drug use: No  ? Sexual activity: Not on file  ?Other Topics Concern  ? Not on file  ?Social History Narrative  ? Not on file  ? ?Social Determinants of Health  ? ?Financial Resource Strain: Not on file  ?Food Insecurity: Not on file   ?Transportation Needs: Not on file  ?Physical Activity: Not on file  ?Stress: Not on file  ?Social Connections: Not on file  ?Intimate Partner Violence: Not on file  ?  ?Family History:   ? ?Family History  ?Problem Relation Age of Onset  ? Pulmonary embolism Father   ?  ? ?ROS:  ?Please see the history of present illness.  ? ?All other ROS reviewed and negative.    ? ?Physical Exam/Data:  ? ?Vitals:  ? 01/27/22 1100 01/27/22 1130 01/27/22 1200 01/27/22 1305  ?BP: 112/80 113/89 109/68 110/69  ?Pulse: 95 97 99 97  ?Resp: (!) 22 17 (!) 21 17  ?Temp:      ?TempSrc:      ?SpO2: 96% 95% 90% 95%  ?Weight:      ?Height:      ? ?No intake or output data in the 24 hours ending 01/27/22 1349 ? ?  01/27/2022  ?  8:29 AM 08/15/2020  ? 12:43 PM 11/01/2018  ? 12:42 PM  ?Last 3 Weights  ?Weight (lbs) 205 lb 184 lb 15.5 oz 185 lb  ?Weight (kg) 92.987 kg 83.9 kg 83.915 kg  ?   ?Body mass index is 36.31 kg/m?.  ?General:  Well nourished, well developed female appearing in no acute distress ?HEENT: normal ?Neck: no JVD ?Vascular: No carotid bruits; Distal pulses 2+ bilaterally ?Cardiac:  normal S1, S2; RRR; no murmur.  ?Lungs:  clear to auscultation bilaterally, no wheezing, rhonchi or rales  ?Abd: soft, nontender, no hepatomegaly  ?Ext: chronic appearing lymphedema ?Musculoskeletal:  No deformities, BUE and BLE strength normal and equal ?Skin: warm and dry  ?Neuro:  CNs 2-12 intact, no focal abnormalities noted ?Psych:  Normal affect  ? ?EKG:  The EKG was personally reviewed and demonstrates: NSR, HR 99 with atrial enlargement. ? ?Telemetry:  Telemetry was personally reviewed and demonstrates: NSR, HR in 90's to 100's. Episode of SVT this AM with HR in the 150's and resolved within 10 minutes.  ? ?Relevant CV Studies: ? ?Echocardiogram: 01/27/2022 ?IMPRESSIONS  ? ? ? 1. Echo findings concerning for acute PE Discussed with ER V/Q pending.  ? 2. Left ventricular ejection fraction, by estimation, is 60 to 65%. The  ?left ventricle has  normal function. The left ventricle has no regional  ?wall motion abnormalities. Left ventricular diastolic parameters were  ?normal.  ? 3. Right ventricular systolic function is moderately reduced. The right  ?ventricular size is moderately enlarged. There is moderately elevated  ?pulmonary artery systolic pressure.  ? 4. Shadowing artifact in RA cannot r/o clot. Right atrial size was mildly  ?dilated.  ? 5. The mitral valve is normal in structure. No evidence of mitral valve  ?regurgitation. No evidence of mitral stenosis.  ?  6. The aortic valve is tricuspid. Aortic valve regurgitation is not  ?visualized. No aortic stenosis is present.  ? 7. The inferior vena cava is dilated in size with >50% respiratory  ?variability, suggesting right atrial pressure of 8 mmHg.  ? ?Laboratory Data: ? ?High Sensitivity Troponin:   ?Recent Labs  ?Lab 01/27/22 ?0853 01/27/22 ?1031  ?TROPONINIHS 187* 278*  ?   ?Chemistry ?Recent Labs  ?Lab 01/27/22 ?[id number]  ?NA 139  ?K 4.0  ?CL 107  ?CO2 21*  ?GLUCOSE 98  ?BUN 16  ?CREATININE 0.68  ?CALCIUM 9.0  ?GFRNONAA >60  ?ANIONGAP 11  ?  ?No results for input(s): PROT, ALBUMIN, AST, ALT, ALKPHOS, BILITOT in the last 168 hours. ?Lipids No results for input(s): CHOL, TRIG, HDL, LABVLDL, LDLCALC, CHOLHDL in the last 168 hours.  ?Hematology ?Recent Labs  ?Lab 01/27/22 ?[id number]  ?WBC 5.5  ?RBC 5.10  ?HGB 13.7  ?HCT 44.3  ?MCV 86.9  ?MCH 26.9  ?MCHC 30.9  ?RDW 15.2  ?PLT 120*  ? ?Thyroid No results for input(s): TSH, FREET4 in the last 168 hours.  ?BNPNo results for input(s): BNP, PROBNP in the last 168 hours.  ?DDimer  ?Recent Labs  ?Lab 01/27/22 ?1122  ?DDIMER 11.30*  ? ? ? ?Radiology/Studies:  ?DG Chest Portable 1 View ? ?Result Date: 01/27/2022 ?CLINICAL DATA:  Shortness of breath, chest pain with heaviness, weakness. EXAM: PORTABLE CHEST 1 VIEW COMPARISON:  Chest x-ray dated 01/09/2021. FINDINGS: Heart size and mediastinal contours are stable. Lungs are clear. No pleural effusion or pneumothorax is  seen. Osseous structures about the chest are unremarkable. IMPRESSION: No active disease.  No evidence of pneumonia or pulmonary edema. Electronically Signed   By: Franki Cabot M.D.   On: 01/27/2022 11:55  ? ? ?Assessment

## 2022-01-27 NOTE — ED Notes (Signed)
Pt stated that she is allergic to contrast. Pt states she has a anaphylaxis reaction. EDP is aware.  ?

## 2022-01-27 NOTE — ED Provider Notes (Addendum)
?[place] ?Provider Note ? ? ?CSN: [id number] ?Arrival date & time:    ? ?  ? ?History ? ?Chief Complaint  ?Patient presents with  ? Chest Pain  ? Shortness of Breath  ? ? ?Carrie [name] is a 61 y.o. female who presents the emergency department complaining of chest tightness, shortness of breath, and weakness worsening today.  Patient states that she had symptoms starting about a week and a half ago with some cold symptoms.  She was around family member who tested positive for COVID.  She states that while she was walking around yesterday she felt as though she had more leg swelling, and felt very fatigued.  She was afraid she was going to pass out.  When she woke up this morning all of her symptoms had worsened, and made her worried enough to prompt her visit to the emergency department.  She denies any history of blood clots.  States that she in the past several weeks had a long car ride to [place], and reports she was pretty sedentary on that trip/with traveling.  No reported history of cardiac or pulmonary problems. ? ?HPI ? ?  ? ?Home Medications ?Prior to Admission medications   ?Medication Sig Start Date End Date Taking? Authorizing Provider  ?carbamide peroxide (DEBROX) 6.5 % OTIC solution Place 5 drops into the right ear 2 (two) times daily. 11/01/18  Yes Hayden Rasmussen, MD  ?meloxicam (MOBIC) 7.5 MG tablet Take 1 tablet (7.5 mg total) by mouth daily. 11/30/21  Yes Lamptey, Myrene Galas, MD  ?ibuprofen (ADVIL,MOTRIN) 600 MG tablet Take 1 tablet (600 mg total) by mouth every 6 (six) hours as needed. ?Patient not taking: Reported on 01/27/2022 01/23/15   Noe Gens, PA-C  ?traMADol (ULTRAM) 50 MG tablet Take 1 tablet (50 mg total) by mouth every 6 (six) hours as needed. ?Patient not taking: Reported on 01/27/2022 02/26/19   Kinnie Feil, PA-C  ?   ? ?Allergies    ?Iodinated contrast media, Gluten meal, Lactose intolerance (gi), Other, Wheat bran, and Cephalexin    ? ?Review of Systems   ?Review of Systems  ?Constitutional:  Positive for fatigue.  ?Respiratory:  Positive for shortness of breath.   ?Cardiovascular:  Positive for chest pain and leg swelling.  ?Neurological:  Positive for weakness. Negative for syncope.  ?All other systems reviewed and are negative. ? ?Physical Exam ?Updated Vital Signs ?BP 110/69   Pulse 97   Temp 97.8 ?F (36.6 ?C) (Rectal)   Resp 17   Ht 5\' 3"  (1.6 m)   Wt 93 kg   SpO2 95%   BMI 36.31 kg/m?  ?Physical Exam ?Vitals and nursing note reviewed.  ?Constitutional:   ?   Appearance: Normal appearance.  ?HENT:  ?   Head: Normocephalic and atraumatic.  ?Eyes:  ?   Conjunctiva/sclera: Conjunctivae normal.  ?Cardiovascular:  ?   Rate and Rhythm: Normal rate and regular rhythm.  ?   Comments: Bilateral nonpitting edema to the level of the knee ?Pulmonary:  ?   Effort: Pulmonary effort is normal. No respiratory distress.  ?   Breath sounds: Wheezing present.  ?   Comments: Diffuse expiratory wheezing ?Abdominal:  ?   General: There is no distension.  ?   Palpations: Abdomen is soft.  ?   Tenderness: There is no abdominal tenderness.  ?Musculoskeletal:  ?   Right lower leg: Edema present.  ?   Left lower leg: Edema present.  ?  Skin: ?   General: Skin is warm and dry.  ?Neurological:  ?   General: No focal deficit present.  ?   Mental Status: She is alert.  ? ? ?ED Results / Procedures / Treatments   ?Labs ?(all labs ordered are listed, but only abnormal results are displayed) ?Labs Reviewed  ?BASIC METABOLIC PANEL - Abnormal; Notable for the following components:  ?    Result Value  ? CO2 21 (*)   ? All other components within normal limits  ?CBC - Abnormal; Notable for the following components:  ? Platelets 120 (*)   ? All other components within normal limits  ?D-DIMER, QUANTITATIVE - Abnormal; Notable for the following components:  ? D-Dimer, Quant 11.30 (*)   ? All other components within normal limits  ?TROPONIN I (HIGH SENSITIVITY) - Abnormal;  Notable for the following components:  ? Troponin I (High Sensitivity) 187 (*)   ? All other components within normal limits  ?TROPONIN I (HIGH SENSITIVITY) - Abnormal; Notable for the following components:  ? Troponin I (High Sensitivity) 278 (*)   ? All other components within normal limits  ?RESP PANEL BY RT-PCR (FLU A&B, COVID) [id number]  ?PROTIME-INR  ?BRAIN NATRIURETIC PEPTIDE  ?HEPARIN LEVEL (UNFRACTIONATED)  ?HIV ANTIBODY (ROUTINE TESTING W REFLEX)  ?I-STAT BETA HCG BLOOD, ED ([facility], [facility], [facility] ONLY)  ?TROPONIN I (HIGH SENSITIVITY)  ? ? ?EKG ?EKG Interpretation ? ?Date/Time:  Saturday January 27 2022 10:50:40 EDT ?Ventricular Rate:  98 ?PR Interval:  157 ?QRS Duration: 80 ?QT Interval:  313 ?QTC Calculation: 400 ?R Axis:   48 ?Text Interpretation: Sinus rhythm Multiple ventricular premature complexes LAE, consider biatrial enlargement Borderline repolarization abnormality Since prior ECG, she is no longer in SVT Confirmed by Gareth Morgan [phone]) on 01/27/2022 11:18:30 AM ? ?Radiology ?DG Chest Portable 1 View ? ?Result Date: 01/27/2022 ?CLINICAL DATA:  Shortness of breath, chest pain with heaviness, weakness. EXAM: PORTABLE CHEST 1 VIEW COMPARISON:  Chest x-ray dated 01/09/2021. FINDINGS: Heart size and mediastinal contours are stable. Lungs are clear. No pleural effusion or pneumothorax is seen. Osseous structures about the chest are unremarkable. IMPRESSION: No active disease.  No evidence of pneumonia or pulmonary edema. Electronically Signed   By: Franki Cabot M.D.   On: 01/27/2022 11:55  ? ?ECHOCARDIOGRAM COMPLETE ? ?Result Date: 01/27/2022 ?   ECHOCARDIOGRAM REPORT   Patient Name:   Carrie [name] Date of Exam: 01/27/2022 Medical Rec #:  [id number]     Height:       63.0 in Accession #:    [id number]    Weight:       205.0 lb Date of Birth:  [birth year]     BSA:          1.954 m? Patient Age:    39 years      BP:           108/76 mmHg Patient Gender: F             HR:           97 bpm. Exam Location:  Inpatient  Procedure: 2D Echo, Color Doppler and Cardiac Doppler STAT ECHO Indications:    Elevated Troponins  History:        Patient has no prior history of Echocardiogram examinations.                 Risk Factors:Dyslipidemia.  Sonographer:    Raquel Sarna Senior RDCS Referring Phys: [id number] [place]  1. Echo findings concerning  for acute PE Discussed with ER V/Q pending.  2. Left ventricular ejection fraction, by estimation, is 60 to 65%. The left ventricle has normal function. The left ventricle has no regional wall motion abnormalities. Left ventricular diastolic parameters were normal.  3. Right ventricular systolic function is moderately reduced. The right ventricular size is moderately enlarged. There is moderately elevated pulmonary artery systolic pressure.  4. Shadowing artifact in RA cannot r/o clot. Right atrial size was mildly dilated.  5. The mitral valve is normal in structure. No evidence of mitral valve regurgitation. No evidence of mitral stenosis.  6. The aortic valve is tricuspid. Aortic valve regurgitation is not visualized. No aortic stenosis is present.  7. The inferior vena cava is dilated in size with >50% respiratory variability, suggesting right atrial pressure of 8 mmHg. FINDINGS  Left Ventricle: Left ventricular ejection fraction, by estimation, is 60 to 65%. The left ventricle has normal function. The left ventricle has no regional wall motion abnormalities. The left ventricular internal cavity size was normal in size. There is  no left ventricular hypertrophy. Left ventricular diastolic parameters were normal. Right Ventricle: The right ventricular size is moderately enlarged. No increase in right ventricular wall thickness. Right ventricular systolic function is moderately reduced. There is moderately elevated pulmonary artery systolic pressure. The tricuspid  regurgitant velocity is 3.16 m/s, and with an assumed right atrial pressure of 8 mmHg, the estimated right ventricular  systolic pressure is [id number] mmHg. Left Atrium: Left atrial size was normal in size. Right Atrium: Shadowing artifact in RA cannot r/o clot. Right atrial size was mildly dilated. Pericardium: There is no evidence of pericardial e

## 2022-01-27 NOTE — Progress Notes (Signed)
BLE venous duplex has been completed.  Preliminary findings given to Dr. Sherrye Payor. ? ? ?Results can be found under chart review under CV PROC. ?01/27/2022 3:33 PM ?Naya Ilagan RVT, RDMS ? ?

## 2022-01-27 NOTE — Progress Notes (Signed)
ANTICOAGULATION CONSULT NOTE - Initial Consult ? ?Pharmacy Consult for Heparin ?Indication: chest pain/ACS ? ?Allergies  ?Allergen Reactions  ? Iodinated Contrast Media Anaphylaxis  ? Gluten Meal Nausea Only  ?  Makes stomach hurt also  ? Lactose Intolerance (Gi) Diarrhea and Nausea Only  ?  Makes stomach hurt also  ? Other Swelling and Rash  ?  Radioactive dyes, heavy metals like nickel  ? Wheat Bran Diarrhea and Nausea Only  ?  Makes stomach hurt also  ? Cephalexin Itching  ? ? ?Patient Measurements: ?Height: 5\' 3"  (160 cm) ?Weight: 93 kg (205 lb) ?IBW/kg (Calculated) : 52.4 ?Heparin Dosing Weight: 73.7 kg ? ?Vital Signs: ?Temp: 97.8 ?F (36.6 ?C) (04/08 [id number]) ?Temp Source: Rectal (04/08 [id number]) ?BP: 95/67 (04/08 1045) ?Pulse Rate: 98 (04/08 1054) ? ?Labs: ?Recent Labs  ?  01/27/22 ?0853 01/27/22 ?1031  ?HGB 13.7  --   ?HCT 44.3  --   ?PLT 120*  --   ?CREATININE 0.68  --   ?TROPONINIHS 187* 278*  ? ? ?Estimated Creatinine Clearance: 81 mL/min (by C-G formula based on SCr of 0.68 mg/dL). ? ? ?Medical History: ?Past Medical History:  ?Diagnosis Date  ? Arthritis   ? High cholesterol   ? Kidney stones   ? Ovarian cyst   ? ? ?Medications:  ?(Not in a hospital admission) ? ?Scheduled:  ?Infusions:  ?PRN:  ? ?Assessment: ?46 yof with a history of Arthritis, HLD. Patient is presenting with chest pain and SOB. Heparin per pharmacy consult placed for chest pain/ACS. ? ?Patient is not on anticoagulation prior to arrival. ? ?Hgb 13.7; plt 120 ? ?Goal of Therapy:  ?Heparin level 0.3-0.7 units/ml ?Monitor platelets by anticoagulation protocol: Yes ?  ?Plan:  ?Give 4000 units bolus x 1 ?Start heparin infusion at 900 units/hr ?Check anti-Xa level in 6 hours and daily while on heparin ?Continue to monitor H&H and platelets ? ?Lorelei Pont, PharmD, BCPS ?01/27/2022 11:31 AM ?ED Clinical Pharmacist -  [phone] ? ? ? ?

## 2022-01-27 NOTE — Progress Notes (Signed)
Pt admitted to [facility] from ED.  Pt is A&O X4 and neuro intact.  Pt placed on telemetry and CCMD notified.  Vitals taken and within normal range.  Pt on 2L O2 [place].  Pain states that they feel pain of 5 out of 10 at the "zenith"  of inhalation. Pt is oriented to the unit with call light in reach.    ? 01/27/22 1645  ?Vitals  ?Temp 98.7 ?F (37.1 ?C)  ?Temp Source Oral  ?BP 102/76  ?MAP (mmHg) 86  ?BP Location Left Arm  ?BP Method Automatic  ?Patient Position (if appropriate) Lying  ?Pulse Rate 94  ?Pulse Rate Source Monitor  ?ECG Heart Rate 94  ?Resp 20  ?Level of Consciousness  ?Level of Consciousness Alert  ?Oxygen Therapy  ?SpO2 99 %  ?O2 Device Nasal Cannula  ?O2 Flow Rate (L/min) 2 L/min  ?Patient Activity (if Appropriate) In bed  ?Pulse Oximetry Type Continuous  ?Pain Assessment  ?Pain Scale 0-10  ?Pain Score 5 ?(Pain felt upon full inhalation)  ?Patients Stated Pain Goal 0  ?Pain Type Acute pain  ?Pain Location Chest  ?Pain Orientation Mid  ?Pain Descriptors / Indicators Heaviness  ?Pain Frequency Intermittent  ?Pain Onset On-going  ?Clinical Progression Not changed  ?Multiple Pain Sites No  ?POSS Scale ([place]ero Opioid Sedation Scale)  ?POSS *See Group Information* 1-Acceptable,Awake and alert  ?PCA/Epidural/Spinal Assessment  ?Respiratory Pattern Regular;Unlabored  ?Glasgow Coma Scale  ?Eye Opening 4  ?Best Verbal Response (NON-intubated) 5  ?Best Motor Response 6  ?Glasgow Coma Scale Score 15  ?MEWS Score  ?MEWS Temp 0  ?MEWS Systolic 0  ?MEWS Pulse 0  ?MEWS RR 0  ?MEWS LOC 0  ?MEWS Score 0  ?MEWS Score Color Green  ? ? ?

## 2022-01-27 NOTE — H&P (Addendum)
History and Physical    Carrie [name] HQI:[id number] DOB: 12/14/1960 DOA: 01/27/2022  PCP: Pcp, No (Confirm with patient/family/NH records and if not entered, this has to be entered at [facility] point of entry) Patient coming from: Home  I have personally briefly reviewed patient's old medical records in [facility]  Chief Complaint: Chest pain, SOB, palpitations.  HPI: Carrie [name] is a 61 y.o. female with medical history significant of bilateral knee OA, obesity, presented with new onset of chest pain palpitation and shortness of breath.  Patient developed flulike symptoms about 7 to 10 days ago, initial symptoms of sore throat runny nose and cough, nonproductive no fever or chills.  3 days ago, her flulike symptoms started to ease away, however patient developed this pressure like chest pain on central chest, worsening with cough and certain positioning of the torso usually bending forward.  She also noticed increasing swelling of her ankles.  She does not smoke, no recent long distance travel.  ED Course: Borderline tachycardia, no hypoxia.  Chest x-ray clear.  D-dimer 11, troponins 187> 278.  Bedside echocardiogram showed concerning signs for right heart strain.  Review of Systems: As per HPI otherwise 14 point review of systems negative.    Past Medical History:  Diagnosis Date   Arthritis    High cholesterol    Kidney stones    Ovarian cyst     Past Surgical History:  Procedure Laterality Date   OVARIAN CYST REMOVAL       reports that she has never smoked. She has never used smokeless tobacco. She reports that she does not drink alcohol and does not use drugs.  Allergies  Allergen Reactions   Iodinated Contrast Media Anaphylaxis   Gluten Meal Nausea Only    Makes stomach hurt also   Lactose Intolerance (Gi) Diarrhea and Nausea Only    Makes stomach hurt also   Other Swelling and Rash    Radioactive dyes, heavy metals like nickel   Wheat Bran Diarrhea and Nausea  Only    Makes stomach hurt also   Cephalexin Itching    Family History  Problem Relation Age of Onset   Pulmonary embolism Father      Prior to Admission medications   Medication Sig Start Date End Date Taking? Authorizing Provider  carbamide peroxide (DEBROX) 6.5 % OTIC solution Place 5 drops into the right ear 2 (two) times daily. 11/01/18  Yes Terrilee Files, MD  meloxicam (MOBIC) 7.5 MG tablet Take 1 tablet (7.5 mg total) by mouth daily. 11/30/21  Yes Lamptey, Britta Mccreedy, MD  ibuprofen (ADVIL,MOTRIN) 600 MG tablet Take 1 tablet (600 mg total) by mouth every 6 (six) hours as needed. Patient not taking: Reported on 01/27/2022 01/23/15   Lurene Shadow, PA-C  traMADol (ULTRAM) 50 MG tablet Take 1 tablet (50 mg total) by mouth every 6 (six) hours as needed. Patient not taking: Reported on 01/27/2022 02/26/19   [place] Hand[place], [place]    Physical Exam: Vitals:   01/27/22 1100 01/27/22 1130 01/27/22 1200 01/27/22 1305  BP: 112/80 113/89 109/68 110/69  Pulse: 95 97 99 97  Resp: (!) 22 17 (!) 21 17  Temp:      TempSrc:      SpO2: 96% 95% 90% 95%  Weight:      Height:        Constitutional: NAD, calm, comfortable Vitals:   01/27/22 1100 01/27/22 1130 01/27/22 1200 01/27/22 1305  BP: 112/80 113/89 109/68 110/69  Pulse:  95 97 99 97  Resp: (!) 22 17 (!) 21 17  Temp:      TempSrc:      SpO2: 96% 95% 90% 95%  Weight:      Height:       Eyes: PERRL, lids and conjunctivae normal ENMT: Mucous membranes are moist. Posterior pharynx clear of any exudate or lesions.Normal dentition.  Neck: normal, supple, no masses, no thyromegaly Respiratory: clear to auscultation bilaterally, no wheezing, no crackles. Normal respiratory effort. No accessory muscle use.  Cardiovascular: Regular rate and rhythm, no murmurs / rubs / gallops. 1+ extremity edema. 2+ pedal pulses. No carotid bruits.  Abdomen: no tenderness, no masses palpated. No hepatosplenomegaly. Bowel sounds positive.  Musculoskeletal: no  clubbing / cyanosis. No joint deformity upper and lower extremities. Good ROM, no contractures. Normal muscle tone.  Skin: no rashes, lesions, ulcers. No induration Neurologic: CN 2-12 grossly intact. Sensation intact, DTR normal. Strength 5/5 in all 4.  Psychiatric: Normal judgment and insight. Alert and oriented x 3. Normal mood.     Labs on Admission: I have personally reviewed following labs and imaging studies  CBC: Recent Labs  Lab 01/27/22 0853  WBC 5.5  HGB 13.7  HCT 44.3  MCV 86.9  PLT 120*   Basic Metabolic Panel: Recent Labs  Lab 01/27/22 0853  NA 139  K 4.0  CL 107  CO2 21*  GLUCOSE 98  BUN 16  CREATININE 0.68  CALCIUM 9.0   GFR: Estimated Creatinine Clearance: 81 mL/min (by C-G formula based on SCr of 0.68 mg/dL). Liver Function Tests: No results for input(s): AST, ALT, ALKPHOS, BILITOT, PROT, ALBUMIN in the last 168 hours. No results for input(s): LIPASE, AMYLASE in the last 168 hours. No results for input(s): AMMONIA in the last 168 hours. Coagulation Profile: Recent Labs  Lab 01/27/22 1134  INR 1.0   Cardiac Enzymes: No results for input(s): CKTOTAL, CKMB, CKMBINDEX, TROPONINI in the last 168 hours. BNP (last 3 results) No results for input(s): PROBNP in the last 8760 hours. HbA1C: No results for input(s): HGBA1C in the last 72 hours. CBG: No results for input(s): GLUCAP in the last 168 hours. Lipid Profile: No results for input(s): CHOL, HDL, LDLCALC, TRIG, CHOLHDL, LDLDIRECT in the last 72 hours. Thyroid Function Tests: No results for input(s): TSH, T4TOTAL, FREET4, T3FREE, THYROIDAB in the last 72 hours. Anemia Panel: No results for input(s): VITAMINB12, FOLATE, FERRITIN, TIBC, IRON, RETICCTPCT in the last 72 hours. Urine analysis:    Component Value Date/Time   COLORURINE STRAW (A) 01/09/2021 0430   APPEARANCEUR CLEAR 01/09/2021 0430   LABSPEC 1.010 01/09/2021 0430   PHURINE 6.0 01/09/2021 0430   GLUCOSEU NEGATIVE 01/09/2021 0430    HGBUR NEGATIVE 01/09/2021 0430   BILIRUBINUR NEGATIVE 01/09/2021 0430   KETONESUR 20 (A) 01/09/2021 0430   PROTEINUR NEGATIVE 01/09/2021 0430   UROBILINOGEN 0.2 01/13/2014 2105   NITRITE NEGATIVE 01/09/2021 0430   LEUKOCYTESUR SMALL (A) 01/09/2021 0430    Radiological Exams on Admission: DG Chest Portable 1 View  Result Date: 01/27/2022 CLINICAL DATA:  Shortness of breath, chest pain with heaviness, weakness. EXAM: PORTABLE CHEST 1 VIEW COMPARISON:  Chest x-ray dated 01/09/2021. FINDINGS: Heart size and mediastinal contours are stable. Lungs are clear. No pleural effusion or pneumothorax is seen. Osseous structures about the chest are unremarkable. IMPRESSION: No active disease.  No evidence of pneumonia or pulmonary edema. Electronically Signed   By: Bary [name] M.D.   On: 01/27/2022 11:55   ECHOCARDIOGRAM COMPLETE  Result Date: 01/27/2022    ECHOCARDIOGRAM REPORT   Patient Name:   BETTYMAE SCHLAFF Date of Exam: 01/27/2022 Medical Rec #:  [id number]     Height:       63.0 in Accession #:    [id number]    Weight:       205.0 lb Date of Birth:  [birth year]     BSA:          1.954 m Patient Age:    60 years      BP:           108/76 mmHg Patient Gender: F             HR:           97 bpm. Exam Location:  Inpatient Procedure: 2D Echo, Color Doppler and Cardiac Doppler STAT ECHO Indications:    Elevated Troponins  History:        Patient has no prior history of Echocardiogram examinations.                 Risk Factors:Dyslipidemia.  Sonographer:    Irving Burton Senior RDCS Referring Phys: [id number] ERIN SCHLOSSMAN IMPRESSIONS  1. Echo findings concerning for acute PE Discussed with ER V/Q pending.  2. Left ventricular ejection fraction, by estimation, is 60 to 65%. The left ventricle has normal function. The left ventricle has no regional wall motion abnormalities. Left ventricular diastolic parameters were normal.  3. Right ventricular systolic function is moderately reduced. The right ventricular size is moderately  enlarged. There is moderately elevated pulmonary artery systolic pressure.  4. Shadowing artifact in RA cannot r/o clot. Right atrial size was mildly dilated.  5. The mitral valve is normal in structure. No evidence of mitral valve regurgitation. No evidence of mitral stenosis.  6. The aortic valve is tricuspid. Aortic valve regurgitation is not visualized. No aortic stenosis is present.  7. The inferior vena cava is dilated in size with >50% respiratory variability, suggesting right atrial pressure of 8 mmHg. FINDINGS  Left Ventricle: Left ventricular ejection fraction, by estimation, is 60 to 65%. The left ventricle has normal function. The left ventricle has no regional wall motion abnormalities. The left ventricular internal cavity size was normal in size. There is  no left ventricular hypertrophy. Left ventricular diastolic parameters were normal. Right Ventricle: The right ventricular size is moderately enlarged. No increase in right ventricular wall thickness. Right ventricular systolic function is moderately reduced. There is moderately elevated pulmonary artery systolic pressure. The tricuspid  regurgitant velocity is 3.16 m/s, and with an assumed right atrial pressure of 8 mmHg, the estimated right ventricular systolic pressure is 47.9 mmHg. Left Atrium: Left atrial size was normal in size. Right Atrium: Shadowing artifact in RA cannot r/o clot. Right atrial size was mildly dilated. Pericardium: There is no evidence of pericardial effusion. Mitral Valve: The mitral valve is normal in structure. No evidence of mitral valve regurgitation. No evidence of mitral valve stenosis. Tricuspid Valve: The tricuspid valve is normal in structure. Tricuspid valve regurgitation is mild . No evidence of tricuspid stenosis. Aortic Valve: The aortic valve is tricuspid. Aortic valve regurgitation is not visualized. No aortic stenosis is present. Pulmonic Valve: The pulmonic valve was normal in structure. Pulmonic valve  regurgitation is not visualized. No evidence of pulmonic stenosis. Aorta: The aortic root is normal in size and structure. Venous: The inferior vena cava is dilated in size with greater than 50% respiratory variability, suggesting right atrial pressure of 8 mmHg. IAS/Shunts: No  atrial level shunt detected by color flow Doppler. Additional Comments: Echo findings concerning for acute PE Discussed with ER V/Q pending.  LEFT VENTRICLE PLAX 2D LVIDd:         3.20 cm   Diastology LVIDs:         2.00 cm   LV e' medial:    7.27 cm/s LV PW:         1.00 cm   LV E/e' medial:  6.8 LV IVS:        0.90 cm   LV e' lateral:   9.14 cm/s LVOT diam:     2.10 cm   LV E/e' lateral: 5.4 LV SV:         64 LV SV Index:   33 LVOT Area:     3.46 cm  RIGHT VENTRICLE TAPSE (M-mode): 2.1 cm LEFT ATRIUM             Index        RIGHT ATRIUM           Index LA diam:        3.00 cm 1.54 cm/m   RA Area:     19.20 cm LA Vol (A2C):   39.2 ml 20.06 ml/m  RA Volume:   58.20 ml  29.78 ml/m LA Vol (A4C):   44.4 ml 22.72 ml/m LA Biplane Vol: 43.3 ml 22.16 ml/m  AORTIC VALVE LVOT Vmax:   112.00 cm/s LVOT Vmean:  70.800 cm/s LVOT VTI:    0.186 m  AORTA Ao Root diam: 3.30 cm MITRAL VALVE               TRICUSPID VALVE MV Area (PHT): 4.36 cm    TR Peak grad:   39.9 mmHg MV Decel Time: 174 msec    TR Vmax:        316.00 cm/s MV E velocity: 49.30 cm/s MV A velocity: 92.90 cm/s  SHUNTS MV E/A ratio:  0.53        Systemic VTI:  0.19 m                            Systemic Diam: 2.10 cm Charlton Haws MD Electronically signed by Charlton Haws MD Signature Date/Time: 01/27/2022/1:15:45 PM    Final     EKG: Independently reviewed.  Sinus tachycardia, poor R wave progression.  Assessment/Plan Principal Problem:   Pulmonary emboli (HCC) Active Problems:   Acute pulmonary embolism (HCC)  (please populate well all problems here in Problem List. (For example, if patient is on BP meds at home and you resume or decide to hold them, it is a problem that needs to  be her. Same for CAD, COPD, HLD and so on)  Chest pain -Likely PE given the significant elevated D-dimer and echo finding of right heart strain.  Study ordered to further investigate direct evidence of PE.  Patient however has severe IV contrast allergy with symptoms of angioedema in the past.  And he declined offer of desensitization.  VQ scan ordered in the ED however patient is concerned about radioactivity associated with VQ scan.  Reassured patient and did a brief literature studies which were showed that radioactivity from V/Q scan less than a formal CT study.  Patient however not at all convinced and declined VQ scan.  Discussed with cardiology, for now we will continue heparin drip, check DVT study. -Outpatient hematology for hypercoagulable state study.  For now we will treat as  unprovoked PE with indication for lifelong anticoagulation.  Sinus tachycardia -Might related to PE, as per cardiology, will start low-dose of beta-blocker.  Elevated troponins -Likely secondary to right heart strain and PE.  Work-up and treatment as above.  Bilateral knee OA -Stable, outpatient PCP follow-up.  DVT prophylaxis: Heparin drip Code Status: Full code Family Communication: None at bedside Disposition Plan: Expect 1 to 2 days hospital stay to transfer heparin drip to Eliquis. Consults called: Cardiology Admission status: Telemetry admission   Emeline General MD [facility] Pager [phone]  01/27/2022, 1:52 PM

## 2022-01-27 NOTE — ED Notes (Signed)
Pt stated she is concerned about radiation from the xray. Pt would like to opt to get an ultrasound if possible. RN will notify EDP ?

## 2022-01-27 NOTE — ED Notes (Signed)
Echo Technician assisted pt to restroom with wheelchair.  ?

## 2022-01-27 NOTE — Progress Notes (Addendum)
DVT study positive. ? ?Echo reviewed with PCCM attending, who plans to come down to see the patient and give further recommendations. ?

## 2022-01-27 NOTE — ED Notes (Signed)
Pt states she has bilateral swelling in lower extremities that have been swelling more over the pass few days. Pt legs shows edema with non-pitting edema.  ?

## 2022-01-27 NOTE — Progress Notes (Signed)
ANTICOAGULATION CONSULT NOTE ?Pharmacy Consult for Heparin ?Indication: pulmonary embolus ? ?Allergies  ?Allergen Reactions  ? Iodinated Contrast Media Anaphylaxis  ? Gluten Meal Nausea Only  ?  Makes stomach hurt also  ? Lactose Intolerance (Gi) Diarrhea and Nausea Only  ?  Makes stomach hurt also  ? Other Swelling and Rash  ?  Radioactive dyes, heavy metals like nickel  ? Wheat Bran Diarrhea and Nausea Only  ?  Makes stomach hurt also  ? Cephalexin Itching  ? ? ?Patient Measurements: ?Height: 5\' 3"  (160 cm) ?Weight: 93 kg (205 lb) ?IBW/kg (Calculated) : 52.4 ?Heparin Dosing Weight: 73.7 kg ? ?Vital Signs: ?Temp: 98.2 ?F (36.8 ?C) (04/08 2012) ?Temp Source: Oral (04/08 2012) ?BP: 95/63 (04/08 2012) ?Pulse Rate: 95 (04/08 2012) ? ?Labs: ?Recent Labs  ?  01/27/22 ?0853 01/27/22 ?1031 01/27/22 ?1134 01/27/22 ?1618 01/27/22 ?2006  ?HGB 13.7  --   --   --   --   ?HCT 44.3  --   --   --   --   ?PLT 120*  --   --   --   --   ?LABPROT  --   --  13.3  --   --   ?INR  --   --  1.0  --   --   ?HEPARINUNFRC  --   --   --   --  0.66  ?CREATININE 0.68  --   --   --   --   ?TROPONINIHS 187* 278*  --  423*  --   ? ? ? ?Estimated Creatinine Clearance: 81 mL/min (by C-G formula based on SCr of 0.68 mg/dL). ? ? ?Medical History: ?Past Medical History:  ?Diagnosis Date  ? Arthritis   ? High cholesterol   ? Kidney stones   ? Ovarian cyst   ? ? ?Medications:  ?Medications Prior to Admission  ?Medication Sig Dispense Refill Last Dose  ? carbamide peroxide (DEBROX) 6.5 % OTIC solution Place 5 drops into the right ear 2 (two) times daily. 15 mL 0 Past Week  ? meloxicam (MOBIC) 7.5 MG tablet Take 1 tablet (7.5 mg total) by mouth daily. 30 tablet 0 01/26/2022  ? ibuprofen (ADVIL,MOTRIN) 600 MG tablet Take 1 tablet (600 mg total) by mouth every 6 (six) hours as needed. (Patient not taking: Reported on 01/27/2022) 30 tablet 0 Not Taking  ? traMADol (ULTRAM) 50 MG tablet Take 1 tablet (50 mg total) by mouth every 6 (six) hours as needed. (Patient  not taking: Reported on 01/27/2022) 15 tablet 0 Not Taking  ? ? ?Scheduled:  ?Infusions:  ?PRN:  ? ?Assessment: ?30 yof with a history of Arthritis, HLD. Patient is presenting with chest pain and SOB. Heparin initially started for ACS. Request from EDP to change heparin dosing to dosing for pulmonary embolus. ? ?Patient is not on anticoagulation prior to arrival. ? ?Initial heparin level therapeutic at 0.66. ? ?Goal of Therapy:  ?Heparin level 0.3-0.7 units/ml ?Monitor platelets by anticoagulation protocol: Yes ?  ?Plan:  ?Continue heparin 1300 units/h ?Confirm heparin level in am ? ?67, PharmD, BCPS, BCCP ?Clinical Pharmacist ?[phone] ?Please check AMION for all [facility] Pharmacy numbers ?01/27/2022 ? ? ? ? ?

## 2022-01-27 NOTE — ED Notes (Signed)
Echo technician at bedside.

## 2022-01-27 NOTE — ED Notes (Signed)
EDP, primary RN and secondary RN completed vagal maneuver which help convert pt rhythm. Pt EKG shot again. ?

## 2022-01-27 NOTE — ED Notes (Signed)
RN called lab to add PT-INR ?

## 2022-01-27 NOTE — ED Notes (Signed)
Pt HR 156 on monitor. EKG shot and EDP at the bedside. ?

## 2022-01-27 NOTE — Consult Note (Signed)
? ?  NAME:  Carrie [name], MRN:  [id number], DOB:  01/07/1961, LOS: 0 ?ADMISSION DATE:  01/27/2022, CONSULTATION DATE: 01/27/2022 ?REFERRING MD: Dr. Roosevelt Locks , CHIEF COMPLAINT: Shortness of breath, pulmonary embolism ? ?History of Present Illness:  ?Patient presented to the hospital with chest tightness, shortness of breath and weakness worsening today ?She had 1 week of shortness of breath ?She also did notice leg swelling on the day prior to presentation ?On the day of presentation noticed chest tightness ?Has a history of varicose veins ? ?History of anaphylaxis with contrast--she remembers lateral swelling ? ?Pertinent  Medical History  ? ?Past Medical History:  ?Diagnosis Date  ? Arthritis   ? High cholesterol   ? Kidney stones   ? Ovarian cyst   ? ?Significant Hospital Events: ?Including procedures, antibiotic start and stop dates in addition to other pertinent events   ?Ultrasound of the lower extremity positive for DVT ?Elevated D-dimer ? ?Interim History / Subjective:  ?Still feels a little short of breath, some discomfort with deep breaths ? ?Objective   ?Blood pressure 102/76, pulse 94, temperature 98.7 ?F (37.1 ?C), temperature source Oral, resp. rate 20, height 5\' 3"  (1.6 m), weight 93 kg, SpO2 99 %. ?   ?   ?No intake or output data in the 24 hours ending 01/27/22 1708 ?Filed Weights  ? 01/27/22 0829  ?Weight: 93 kg  ? ? ?Examination: ?General: Middle-age lady, does not appear to be in extremities, on oxygen supplementation ?HENT: Moist oral mucosa ?Lungs: Clear breath sounds bilaterally ?Cardiovascular: S1-S2 appreciated ?Abdomen: Bowel sounds appreciated ?Extremities: No clubbing, slight tenderness right calf ?Neuro: Alert and oriented x3 ?GU:  ? ?Echocardiogram ?-With evidence of right ventricular strain, moderately enlarged, right ventricular systolic function is moderately reduced with moderately elevated pulmonary artery systolic pressure ? ?BNP mildly elevated, troponin elevated ? ? ?Resolved Hospital  Problem list   ? ? ?Assessment & Plan:  ?Pulmonary embolism ?-Extent of the embolus is unclear but it is at least submassive ?-Characterization of the clot load is difficult with patient declining to have any other invasive testing ?-She is afraid of exposure to IV contrast because of a history of anaphylaxis ?-I did try to reassure her that prophylaxis can be provided to lessen the chance of anaphylaxis ?-She also did refuse VQ scan ? ?She wants to continue current care which is anticoagulation with heparin ? ?I did let her know that if there is any signs of decompensation then we have to consider more invasive ?She is aware of the risk of decompensation, she states she will continue to pray about it ? ?Currently on 2 L of oxygen and saturating about 96%, mild intermittent tachycardia, no documentation of hypotension ? ?Submassive PE ?-Simplified PESI places her at low risk however, the right heart strain is concerning ? ?Continue current care ? ?Will continue to follow with you ? ?Sherrilyn Rist, MD ?[facility] ?Pager: See Amion ? ? ? ? ?

## 2022-01-27 NOTE — ED Notes (Signed)
Pt asked if she will be on Cardizem for the rest of her life. RN informed pt that once she is discharge all doctors involved in her care will discuss medication plan going forward. Pt stated since she was told her rhythm is not life threatening she will not need to take the Cardizem now. RN explained the to the pt the benefits and risk of medication. Pt agreed to take medication after explanation.  ?

## 2022-01-27 NOTE — ED Notes (Signed)
Pt ambulated to restroom with assistance. While ambulating to restroom pt made some stops and was slightly unsteady. RN recommended that pt use a wheel chair going forward to go to and from restroom.  ?

## 2022-01-28 ENCOUNTER — Inpatient Hospital Stay (HOSPITAL_COMMUNITY): Payer: Medicaid Other

## 2022-01-28 ENCOUNTER — Other Ambulatory Visit (HOSPITAL_COMMUNITY): Payer: Self-pay | Admitting: Emergency Medicine

## 2022-01-28 DIAGNOSIS — I2609 Other pulmonary embolism with acute cor pulmonale: Secondary | ICD-10-CM

## 2022-01-28 DIAGNOSIS — R000 Tachycardia, unspecified: Secondary | ICD-10-CM

## 2022-01-28 DIAGNOSIS — R778 Other specified abnormalities of plasma proteins: Secondary | ICD-10-CM

## 2022-01-28 HISTORY — PX: IR ANGIOGRAM SELECTIVE EACH ADDITIONAL VESSEL: IMG667

## 2022-01-28 HISTORY — PX: IR ANGIOGRAM PULMONARY BILATERAL SELECTIVE: IMG664

## 2022-01-28 HISTORY — PX: IR US GUIDE VASC ACCESS RIGHT: IMG2390

## 2022-01-28 HISTORY — PX: IR INFUSION THROMBOL ARTERIAL INITIAL (MS): IMG5376

## 2022-01-28 LAB — CBC
HCT: 37 % (ref 36.0–46.0)
HCT: 40.9 % (ref 36.0–46.0)
Hemoglobin: 12 g/dL (ref 12.0–15.0)
Hemoglobin: 12.9 g/dL (ref 12.0–15.0)
MCH: 27 pg (ref 26.0–34.0)
MCH: 26.3 pg (ref 26.0–34.0)
MCHC: 32.4 g/dL (ref 30.0–36.0)
MCHC: 31.5 g/dL (ref 30.0–36.0)
MCV: 83.1 fL (ref 80.0–100.0)
MCV: 83.5 fL (ref 80.0–100.0)
Platelets: 166 10*3/uL (ref 150–400)
Platelets: 162 10*3/uL (ref 150–400)
RBC: 4.45 MIL/uL (ref 3.87–5.11)
RBC: 4.9 MIL/uL (ref 3.87–5.11)
RDW: 15.3 % (ref 11.5–15.5)
RDW: 14.9 % (ref 11.5–15.5)
WBC: 6.5 10*3/uL (ref 4.0–10.5)
WBC: 5 10*3/uL (ref 4.0–10.5)
nRBC: 0 % (ref 0.0–0.2)
nRBC: 0 % (ref 0.0–0.2)

## 2022-01-28 LAB — HEPARIN LEVEL (UNFRACTIONATED)
Heparin Unfractionated: 0.67 IU/mL (ref 0.30–0.70)
Heparin Unfractionated: 0.58 IU/mL (ref 0.30–0.70)

## 2022-01-28 LAB — GLUCOSE, CAPILLARY: Glucose-Capillary: 126 mg/dL — ABNORMAL HIGH (ref 70–99)

## 2022-01-28 LAB — FIBRINOGEN: Fibrinogen: 454 mg/dL (ref 210–475)

## 2022-01-28 MED ORDER — MIDAZOLAM HCL 2 MG/2ML IJ SOLN
INTRAMUSCULAR | Status: AC | PRN
Start: 2022-01-28 — End: 2022-01-28
  Administered 2022-01-28: 1 mg via INTRAVENOUS
  Administered 2022-01-28: .5 mg via INTRAVENOUS

## 2022-01-28 MED ORDER — METHYLPREDNISOLONE SODIUM SUCC 125 MG IJ SOLR
INTRAMUSCULAR | Status: AC | PRN
  Administered 2022-01-28: 125 mg via INTRAVENOUS

## 2022-01-28 MED ORDER — DIPHENHYDRAMINE HCL 50 MG/ML IJ SOLN: 50.0000 mg | Freq: Once | INTRAMUSCULAR | Status: AC

## 2022-01-28 MED ORDER — SODIUM CHLORIDE 0.9 % IV SOLN
12.0000 mg | Freq: Once | INTRAVENOUS | Status: AC
  Administered 2022-01-28: 12 mg via INTRAVENOUS
  Filled 2022-01-28: qty 12

## 2022-01-28 MED ORDER — METHYLPREDNISOLONE SODIUM SUCC 125 MG IJ SOLR
INTRAMUSCULAR | Status: AC
  Filled 2022-01-28: qty 2

## 2022-01-28 MED ORDER — SODIUM CHLORIDE 0.9 % IV SOLN: 250.0000 mL | INTRAVENOUS | Status: DC | PRN

## 2022-01-28 MED ORDER — SODIUM CHLORIDE 0.9% FLUSH
3.0000 mL | INTRAVENOUS | Status: DC | PRN
  Administered 2022-01-29: 3 mL via INTRAVENOUS

## 2022-01-28 MED ORDER — IOHEXOL 350 MG/ML SOLN
80.0000 mL | Freq: Once | INTRAVENOUS | Status: AC | PRN
  Administered 2022-01-28: 80 mL via INTRAVENOUS

## 2022-01-28 MED ORDER — IOHEXOL 300 MG/ML  SOLN: 100.0000 mL | Freq: Once | INTRAMUSCULAR | Status: DC | PRN

## 2022-01-28 MED ORDER — FAMOTIDINE IN NACL 20-0.9 MG/50ML-% IV SOLN
20.0000 mg | Freq: Once | INTRAVENOUS | Status: AC
  Filled 2022-01-28: qty 50

## 2022-01-28 MED ORDER — HYDROCORTISONE SOD SUC (PF) 250 MG IJ SOLR
200.0000 mg | Freq: Once | INTRAMUSCULAR | Status: AC
  Administered 2022-01-28: 200 mg via INTRAVENOUS
  Filled 2022-01-28: qty 200

## 2022-01-28 MED ORDER — LIDOCAINE HCL 1 % IJ SOLN
INTRAMUSCULAR | Status: AC
  Filled 2022-01-28: qty 20

## 2022-01-28 MED ORDER — SODIUM CHLORIDE 0.9 % IV SOLN
INTRAVENOUS | Status: AC
Start: 2022-01-28 — End: 2022-01-28

## 2022-01-28 MED ORDER — DIPHENHYDRAMINE HCL 50 MG/ML IJ SOLN
50.0000 mg | Freq: Once | INTRAMUSCULAR | Status: AC
  Administered 2022-01-28: 50 mg via INTRAVENOUS
  Filled 2022-01-28: qty 1

## 2022-01-28 MED ORDER — SODIUM CHLORIDE 0.9 % IV SOLN: INTRAVENOUS | Status: DC

## 2022-01-28 MED ORDER — FAMOTIDINE IN NACL 20-0.9 MG/50ML-% IV SOLN
INTRAVENOUS | Status: AC | PRN
  Administered 2022-01-28: 20 mg via INTRAVENOUS

## 2022-01-28 MED ORDER — MIDAZOLAM HCL 2 MG/2ML IJ SOLN
INTRAMUSCULAR | Status: AC
  Filled 2022-01-28: qty 2

## 2022-01-28 MED ORDER — SODIUM CHLORIDE 0.9% FLUSH
3.0000 mL | Freq: Two times a day (BID) | INTRAVENOUS | Status: DC
  Administered 2022-01-28 – 2022-01-30 (×5): 3 mL via INTRAVENOUS

## 2022-01-28 MED ORDER — METHYLPREDNISOLONE SODIUM SUCC 125 MG IJ SOLR: 125.0000 mg | Freq: Once | INTRAMUSCULAR | Status: AC

## 2022-01-28 MED ORDER — DIPHENHYDRAMINE HCL 50 MG/ML IJ SOLN
INTRAMUSCULAR | Status: AC
  Filled 2022-01-28: qty 1

## 2022-01-28 MED ORDER — DIPHENHYDRAMINE HCL 50 MG/ML IJ SOLN
INTRAMUSCULAR | Status: AC | PRN
  Administered 2022-01-28: 50 mg via INTRAVENOUS

## 2022-01-28 MED ORDER — METHYLPREDNISOLONE SODIUM SUCC 125 MG IJ SOLR
60.0000 mg | Freq: Once | INTRAMUSCULAR | Status: DC
  Filled 2022-01-28: qty 2

## 2022-01-28 MED ORDER — SODIUM CHLORIDE 0.9 % IV BOLUS
500.0000 mL | Freq: Once | INTRAVENOUS | Status: AC
  Administered 2022-01-28: 500 mL via INTRAVENOUS

## 2022-01-28 NOTE — Progress Notes (Signed)
ANTICOAGULATION CONSULT NOTE ?Pharmacy Consult for Heparin ?Indication: pulmonary embolus ? ?Allergies  ?Allergen Reactions  ? Iodinated Contrast Media Anaphylaxis  ? Gluten Meal Nausea Only  ?  Makes stomach hurt also  ? Lactose Intolerance (Gi) Diarrhea and Nausea Only  ?  Makes stomach hurt also  ? Other Swelling and Rash  ?  Radioactive dyes, heavy metals like nickel  ? Wheat Bran Diarrhea and Nausea Only  ?  Makes stomach hurt also  ? Cephalexin Itching  ? ? ?Patient Measurements: ?Height: 5\' 3"  (160 cm) ?Weight: 93 kg (205 lb) ?IBW/kg (Calculated) : 52.4 ?Heparin Dosing Weight: 73.7 kg ? ?Vital Signs: ?Temp: 97.9 ?F (36.6 ?C) (04/09 2300) ?Temp Source: Oral (04/09 2300) ?BP: 134/89 (04/09 2200) ?Pulse Rate: 81 (04/09 2200) ? ?Labs: ?Recent Labs  ?  01/27/22 ?0853 01/27/22 ?1031 01/27/22 ?1134 01/27/22 ?1618 01/27/22 ?2006 01/28/22 ?0145 01/28/22 ?2222  ?HGB 13.7  --   --   --   --  12.0 12.9  ?HCT 44.3  --   --   --   --  37.0 40.9  ?PLT 120*  --   --   --   --  166 162  ?LABPROT  --   --  13.3  --   --   --   --   ?INR  --   --  1.0  --   --   --   --   ?HEPARINUNFRC  --   --   --   --  0.66 0.67 0.58  ?CREATININE 0.68  --   --   --   --   --   --   ?TROPONINIHS 187* 278*  --  423*  --   --   --   ? ? ? ?Estimated Creatinine Clearance: 81 mL/min (by C-G formula based on SCr of 0.68 mg/dL). ? ? ?Medical History: ?Past Medical History:  ?Diagnosis Date  ? Arthritis   ? High cholesterol   ? Kidney stones   ? Ovarian cyst   ? ? ?Medications:  ?Medications Prior to Admission  ?Medication Sig Dispense Refill Last Dose  ? carbamide peroxide (DEBROX) 6.5 % OTIC solution Place 5 drops into the right ear 2 (two) times daily. 15 mL 0 Past Week  ? meloxicam (MOBIC) 7.5 MG tablet Take 1 tablet (7.5 mg total) by mouth daily. 30 tablet 0 01/26/2022  ? ibuprofen (ADVIL,MOTRIN) 600 MG tablet Take 1 tablet (600 mg total) by mouth every 6 (six) hours as needed. (Patient not taking: Reported on 01/27/2022) 30 tablet 0 Not Taking  ?  traMADol (ULTRAM) 50 MG tablet Take 1 tablet (50 mg total) by mouth every 6 (six) hours as needed. (Patient not taking: Reported on 01/27/2022) 15 tablet 0 Not Taking  ? ? ?Scheduled:  ?Infusions:  ?PRN:  ? ?Assessment: ?36 yof with a history of Arthritis, HLD. Patient is presenting with chest pain and SOB. Heparin initially started for ACS. Request from EDP to change heparin dosing to dosing for pulmonary embolus. ? ?Patient is not on anticoagulation prior to arrival. ? ?Initial heparin level therapeutic at 0.67 ? ?4/9 PM update:  ?Heparin level is therapeutic ?Currently undergoing lysis ? ? ?Goal of Therapy:  ?Heparin level 0.3-0.7 units/ml during lysis per consult notes ?Monitor platelets by anticoagulation protocol: Yes ?  ?Plan:  ?Continue heparin 1300 units/hr ?Q6H heparin levels during lysis ? ?6/9, PharmD, BCPS ?Clinical Pharmacist ?Phone: [phone] ? ? ? ? ?

## 2022-01-28 NOTE — Progress Notes (Signed)
Patient accompanied by RN to CT scan due to previous anaphalaxis with contrast. Premedications administered per protocol. Patient tolerated procedure well with no reactions. Brynda Rim, RN ? ?

## 2022-01-28 NOTE — Assessment & Plan Note (Addendum)
-  suspected- CTA pending after pre-medication ?-d dimer elevated ?R leg DVT on U/S ?-heparin ?-appreciate PCCM consult ?+family history, Outpatient hematology for hypercoagulable state study.  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 ?

## 2022-01-28 NOTE — Consult Note (Signed)
? ?Chief Complaint: ?Patient was seen in consultation today for submassive pulmonary embolism ? ?Referring Physician(s): ?Adewale Olalere ? ?Patient Status: [facility] - In-pt ? ?History of Present Illness: ?Carrie [name] is a 61 y.o. female presented with chest pressure and shortness of breath.  Found to have right heart strain on echo and had a Chest CTA after patient was pre-medicated for contrast allergy.  CTA showed moderate amount of bilateral PE and right heart strain.  Dr. Wynona Neat has evaluated the patient and recommends catheter-directed thrombolytics due to the right heart strain.  Patient is resting comfortable in bed at this time on room air but says that she is short of breath when moving and still has chest discomfort.  She was pre-medicated for Chest CTA and did not have any symptoms.  Lower extremity venous duplex demonstrated acute DVT in right peroneal veins. No recent surgeries or trauma.  No history of malignancy. ? ?Past Medical History:  ?Diagnosis Date  ? Arthritis   ? High cholesterol   ? Kidney stones   ? Ovarian cyst   ? ? ?Past Surgical History:  ?Procedure Laterality Date  ? OVARIAN CYST REMOVAL    ? ? ?Allergies: ?Iodinated contrast media, Gluten meal, Lactose intolerance (gi), Other, Wheat bran, and Cephalexin ? ?Medications: ?Prior to Admission medications   ?Medication Sig Start Date End Date Taking? Authorizing Provider  ?carbamide peroxide (DEBROX) 6.5 % OTIC solution Place 5 drops into the right ear 2 (two) times daily. 11/01/18  Yes Terrilee Files, MD  ?meloxicam (MOBIC) 7.5 MG tablet Take 1 tablet (7.5 mg total) by mouth daily. 11/30/21  Yes Lamptey, Britta Mccreedy, MD  ?ibuprofen (ADVIL,MOTRIN) 600 MG tablet Take 1 tablet (600 mg total) by mouth every 6 (six) hours as needed. ?Patient not taking: Reported on 01/27/2022 01/23/15   Lurene Shadow, PA-C  ?traMADol (ULTRAM) 50 MG tablet Take 1 tablet (50 mg total) by mouth every 6 (six) hours as needed. ?Patient not taking: Reported on 01/27/2022  02/26/19   Liberty Handy, PA-C  ?  ? ?Family History  ?Problem Relation Age of Onset  ? Pulmonary embolism Father   ? ? ?Social History  ? ?Socioeconomic History  ? Marital status: Single  ?  Spouse name: Not on file  ? Number of children: Not on file  ? Years of education: Not on file  ? Highest education level: Not on file  ?Occupational History  ? Not on file  ?Tobacco Use  ? Smoking status: Never  ? Smokeless tobacco: Never  ?Substance and Sexual Activity  ? Alcohol use: No  ? Drug use: No  ? Sexual activity: Not on file  ?Other Topics Concern  ? Not on file  ?Social History Narrative  ? Not on file  ? ?Social Determinants of Health  ? ?Financial Resource Strain: Not on file  ?Food Insecurity: Not on file  ?Transportation Needs: Not on file  ?Physical Activity: Not on file  ?Stress: Not on file  ?Social Connections: Not on file  ? ? ?Review of Systems  ?Respiratory:  Positive for chest tightness and shortness of breath.   ?Cardiovascular:  Positive for leg swelling.  ? ?Vital Signs: ?BP 131/76 (BP Location: Left Arm)   Pulse 86   Temp 98.1 ?F (36.7 ?C) (Oral)   Resp 20   Ht 5\' 3"  (1.6 m)   Wt 93 kg   SpO2 95%   BMI 36.31 kg/m?  ? ?Physical Exam ?Constitutional:   ?   General:  She is not in acute distress. ?Cardiovascular:  ?   Rate and Rhythm: Normal rate and regular rhythm.  ?Pulmonary:  ?   Effort: Pulmonary effort is normal.  ?   Breath sounds: Normal breath sounds.  ?Neurological:  ?   Mental Status: She is alert.  ? ? ?Imaging: ?CT Angio Chest Pulmonary Embolism (PE) W or WO Contrast ? ?Result Date: 01/28/2022 ?CLINICAL DATA:  Shortness of breath.  DVT. EXAM: CT ANGIOGRAPHY CHEST WITH CONTRAST TECHNIQUE: Multidetector CT imaging of the chest was performed using the standard protocol during bolus administration of intravenous contrast. Multiplanar CT image reconstructions and MIPs were obtained to evaluate the vascular anatomy. RADIATION DOSE REDUCTION: This exam was performed according to the  departmental dose-optimization program which includes automated exposure control, adjustment of the mA and/or kV according to patient size and/or use of iterative reconstruction technique. CONTRAST:  80mL OMNIPAQUE IOHEXOL 350 MG/ML SOLN COMPARISON:  09/17/2012.  Chest radiograph, 01/27/2022. FINDINGS: Cardiovascular: Bilateral pulmonary emboli. On the right, pulmonary emboli extend from the peripheral pulmonary artery into segmental branches to the right upper, middle and lower lobes, nonocclusive. On the left, pulmonary emboli are also noted extending from the peripheral main pulmonary artery into segmental branches to the upper and lower lobes. Anterior segmental branch to the left upper lobe appears occlusive. Heart is normal in size. RV LV ratio, 4.4/2.6 = 1.69. Trace pericardial fluid. Prominent main pulmonary artery measuring 3.7 cm. Normal caliber aorta. No aortic dissection or significant atherosclerosis. Mediastinum/Nodes: No neck base, mediastinal or hilar masses or enlarged lymph nodes. Trachea and esophagus are unremarkable. Lungs/Pleura: Minor atelectasis or scarring at the anterior base of the left upper lobe lingula. Lungs otherwise clear. No pleural effusion or pneumothorax. Upper Abdomen: No acute abnormality. Musculoskeletal: No fracture or acute finding.  No bone lesion. Review of the MIP images confirms the above findings. IMPRESSION: 1. Multiple bilateral pulmonary emboli. Positive for acute PE with CT evidence of right heart strain (RV/LV Ratio = 1.69) consistent with at least submassive (intermediate risk) PE. The presence of right heart strain has been associated with an increased risk of morbidity and mortality. Please refer to the "PE Focused" order set in EPIC. 2. Lungs essentially clear with no evidence of infarction. No other acute findings. Electronically Signed   By: Amie Portlandavid  Ormond M.D.   On: 01/28/2022 14:40  ? ?DG Chest Portable 1 View ? ?Result Date: 01/27/2022 ?CLINICAL DATA:   Shortness of breath, chest pain with heaviness, weakness. EXAM: PORTABLE CHEST 1 VIEW COMPARISON:  Chest x-ray dated 01/09/2021. FINDINGS: Heart size and mediastinal contours are stable. Lungs are clear. No pleural effusion or pneumothorax is seen. Osseous structures about the chest are unremarkable. IMPRESSION: No active disease.  No evidence of pneumonia or pulmonary edema. Electronically Signed   By: Bary RichardStan  Maynard M.D.   On: 01/27/2022 11:55  ? ?ECHOCARDIOGRAM COMPLETE ? ?Result Date: 01/27/2022 ?   ECHOCARDIOGRAM REPORT   Patient Name:   Carrie [name] Date of Exam: 01/27/2022 Medical Rec #:  [id number]     Height:       63.0 in Accession #:    [id number]    Weight:       205.0 lb Date of Birth:  1960-10-30     BSA:          1.954 m? Patient Age:    60 years      BP:           108/76 mmHg Patient Gender: F  HR:           97 bpm. Exam Location:  Inpatient Procedure: 2D Echo, Color Doppler and Cardiac Doppler STAT ECHO Indications:    Elevated Troponins  History:        Patient has no prior history of Echocardiogram examinations.                 Risk Factors:Dyslipidemia.  Sonographer:    Irving Burton Senior RDCS Referring Phys: [id number] ERIN SCHLOSSMAN IMPRESSIONS  1. Echo findings concerning for acute PE Discussed with ER V/Q pending.  2. Left ventricular ejection fraction, by estimation, is 60 to 65%. The left ventricle has normal function. The left ventricle has no regional wall motion abnormalities. Left ventricular diastolic parameters were normal.  3. Right ventricular systolic function is moderately reduced. The right ventricular size is moderately enlarged. There is moderately elevated pulmonary artery systolic pressure.  4. Shadowing artifact in RA cannot r/o clot. Right atrial size was mildly dilated.  5. The mitral valve is normal in structure. No evidence of mitral valve regurgitation. No evidence of mitral stenosis.  6. The aortic valve is tricuspid. Aortic valve regurgitation is not visualized. No  aortic stenosis is present.  7. The inferior vena cava is dilated in size with >50% respiratory variability, suggesting right atrial pressure of 8 mmHg. FINDINGS  Left Ventricle: Left ventricular ejection fraction, by estima

## 2022-01-28 NOTE — Hospital Course (Signed)
Carrie [name] is a 61 y.o. female with medical history significant of bilateral knee OA, obesity, presented with new onset of chest pain palpitation and shortness of breath.  Found to have DVT and presumed PE-- CTA pending.  Family h/o clots in mother.  ?

## 2022-01-28 NOTE — Progress Notes (Signed)
? ?Primary Cardiology:  [facility] ?Subjective:  ? ?Dyspnea improved She spoke with family and is willing to have CT V/Q and possible intervention She has contrast allergy No further SVT ? ?Objective:  ?Vitals:  ? 01/27/22 2012 01/27/22 2325 01/28/22 0450 01/28/22 0725  ?BP: 95/63 106/67 91/64 107/71  ?Pulse: 95 92 88 85  ?Resp: 18 20 19 17   ?Temp: 98.2 ?F (36.8 ?C) 98.7 ?F (37.1 ?C) 98.5 ?F (36.9 ?C) 97.6 ?F (36.4 ?C)  ?TempSrc: Oral Oral Oral Oral  ?SpO2: 91% 93% 93% 98%  ?Weight:      ?Height:      ? ? ?Intake/Output from previous day: ? ?Intake/Output Summary (Last 24 hours) at 01/28/2022 0826 ?Last data filed at 01/28/2022 0300 ?Gross per 24 hour  ?Intake 234.27 ml  ?Output --  ?Net 234.27 ml  ? ? ?Physical Exam: ?Affect appropriate ?Healthy:  appears stated age ?HEENT: normal ?Neck supple with no adenopathy ?JVP normal no bruits no thyromegaly ?Lungs clear with no wheezing and good diaphragmatic motion ?Heart:  S1/S2 no murmur, no rub, gallop or click ?PMI normal ?Abdomen: benighn, BS positve, no tenderness, no AAA ?no bruit.  No HSM or HJR ?Distal pulses intact with no bruits ?No edema ?Neuro non-focal ?Skin warm and dry ?No muscular weakness ? ? ?Lab Results: ?Basic Metabolic Panel: ?Recent Labs  ?  01/27/22 ?[id number]  ?NA 139  ?K 4.0  ?CL 107  ?CO2 21*  ?GLUCOSE 98  ?BUN 16  ?CREATININE 0.68  ?CALCIUM 9.0  ? ?Liver Function Tests: ?No results for input(s): AST, ALT, ALKPHOS, BILITOT, PROT, ALBUMIN in the last 72 hours. ?No results for input(s): LIPASE, AMYLASE in the last 72 hours. ?CBC: ?Recent Labs  ?  01/27/22 ?0853 01/28/22 ?0145  ?WBC 5.5 6.5  ?HGB 13.7 12.0  ?HCT 44.3 37.0  ?MCV 86.9 83.1  ?PLT 120* 166  ? ?Cardiac Enzymes: ?No results for input(s): CKTOTAL, CKMB, CKMBINDEX, TROPONINI in the last 72 hours. ?BNP: ?Invalid input(s): POCBNP ?D-Dimer: ?Recent Labs  ?  01/27/22 ?1122  ?DDIMER 11.30*  ? ? ? ?Imaging: ?DG Chest Portable 1 View ? ?Result Date: 01/27/2022 ?CLINICAL DATA:  Shortness of breath, chest pain  with heaviness, weakness. EXAM: PORTABLE CHEST 1 VIEW COMPARISON:  Chest x-ray dated 01/09/2021. FINDINGS: Heart size and mediastinal contours are stable. Lungs are clear. No pleural effusion or pneumothorax is seen. Osseous structures about the chest are unremarkable. IMPRESSION: No active disease.  No evidence of pneumonia or pulmonary edema. Electronically Signed   By: Franki Cabot M.D.   On: 01/27/2022 11:55  ? ?ECHOCARDIOGRAM COMPLETE ? ?Result Date: 01/27/2022 ?   ECHOCARDIOGRAM REPORT   Patient Name:   Carrie [name] Date of Exam: 01/27/2022 Medical Rec #:  [id number]     Height:       63.0 in Accession #:    [id number]    Weight:       205.0 lb Date of Birth:  05-02-1961     BSA:          1.954 m? Patient Age:    61 years      BP:           108/76 mmHg Patient Gender: F             HR:           97 bpm. Exam Location:  Inpatient Procedure: 2D Echo, Color Doppler and Cardiac Doppler STAT ECHO Indications:    Elevated Troponins  History:  Patient has no prior history of Echocardiogram examinations.                 Risk Factors:Dyslipidemia.  Sonographer:    Raquel Sarna Senior RDCS Referring Phys: [id number] [place]  1. Echo findings concerning for acute PE Discussed with ER V/Q pending.  2. Left ventricular ejection fraction, by estimation, is 60 to 65%. The left ventricle has normal function. The left ventricle has no regional wall motion abnormalities. Left ventricular diastolic parameters were normal.  3. Right ventricular systolic function is moderately reduced. The right ventricular size is moderately enlarged. There is moderately elevated pulmonary artery systolic pressure.  4. Shadowing artifact in RA cannot r/o clot. Right atrial size was mildly dilated.  5. The mitral valve is normal in structure. No evidence of mitral valve regurgitation. No evidence of mitral stenosis.  6. The aortic valve is tricuspid. Aortic valve regurgitation is not visualized. No aortic stenosis is present.  7. The  inferior vena cava is dilated in size with >50% respiratory variability, suggesting right atrial pressure of 8 mmHg. FINDINGS  Left Ventricle: Left ventricular ejection fraction, by estimation, is 60 to 65%. The left ventricle has normal function. The left ventricle has no regional wall motion abnormalities. The left ventricular internal cavity size was normal in size. There is  no left ventricular hypertrophy. Left ventricular diastolic parameters were normal. Right Ventricle: The right ventricular size is moderately enlarged. No increase in right ventricular wall thickness. Right ventricular systolic function is moderately reduced. There is moderately elevated pulmonary artery systolic pressure. The tricuspid  regurgitant velocity is 3.16 m/s, and with an assumed right atrial pressure of 8 mmHg, the estimated right ventricular systolic pressure is [id number] mmHg. Left Atrium: Left atrial size was normal in size. Right Atrium: Shadowing artifact in RA cannot r/o clot. Right atrial size was mildly dilated. Pericardium: There is no evidence of pericardial effusion. Mitral Valve: The mitral valve is normal in structure. No evidence of mitral valve regurgitation. No evidence of mitral valve stenosis. Tricuspid Valve: The tricuspid valve is normal in structure. Tricuspid valve regurgitation is mild . No evidence of tricuspid stenosis. Aortic Valve: The aortic valve is tricuspid. Aortic valve regurgitation is not visualized. No aortic stenosis is present. Pulmonic Valve: The pulmonic valve was normal in structure. Pulmonic valve regurgitation is not visualized. No evidence of pulmonic stenosis. Aorta: The aortic root is normal in size and structure. Venous: The inferior vena cava is dilated in size with greater than 50% respiratory variability, suggesting right atrial pressure of 8 mmHg. IAS/Shunts: No atrial level shunt detected by color flow Doppler. Additional Comments: Echo findings concerning for acute PE Discussed with  ER V/Q pending.  LEFT VENTRICLE PLAX 2D LVIDd:         3.20 cm   Diastology LVIDs:         2.00 cm   LV e' medial:    7.27 cm/s LV PW:         1.00 cm   LV E/e' medial:  6.8 LV IVS:        0.90 cm   LV e' lateral:   9.14 cm/s LVOT diam:     2.10 cm   LV E/e' lateral: 5.4 LV SV:         64 LV SV Index:   33 LVOT Area:     3.46 cm?  RIGHT VENTRICLE TAPSE (M-mode): 2.1 cm LEFT ATRIUM  Index        RIGHT ATRIUM           Index LA diam:        3.00 cm 1.54 cm/m?   RA Area:     19.20 cm? LA Vol (A2C):   39.2 ml 20.06 ml/m?  RA Volume:   58.20 ml  29.78 ml/m? LA Vol (A4C):   44.4 ml 22.72 ml/m? LA Biplane Vol: 43.3 ml 22.16 ml/m?  AORTIC VALVE LVOT Vmax:   112.00 cm/s LVOT Vmean:  70.800 cm/s LVOT VTI:    0.186 m  AORTA Ao Root diam: 3.30 cm MITRAL VALVE               TRICUSPID VALVE MV Area (PHT): 4.36 cm?    TR Peak grad:   39.9 mmHg MV Decel Time: 174 msec    TR Vmax:        316.00 cm/s MV E velocity: 49.30 cm/s MV A velocity: 92.90 cm/s  SHUNTS MV E/A ratio:  0.53        Systemic VTI:  0.19 m                            Systemic Diam: 2.10 cm Jenkins Rouge MD Electronically signed by Jenkins Rouge MD Signature Date/Time: 01/27/2022/1:15:45 PM    Final   ? ?VAS [place] LOWER EXTREMITY VENOUS (DVT) ? ?Result Date: 01/27/2022 ? Lower Venous DVT Study Patient Name:  SAROYA ADAMS  Date of Exam:   01/27/2022 Medical Rec #: [id number]      Accession #:    [id number] Date of Birth: Dec 11, 1960      Patient Gender: F Patient Age:   64 years Exam [facility]:  [facility] Procedure:      VAS [place] LOWER EXTREMITY VENOUS (DVT) Referring Phys: Wynetta Fines --------------------------------------------------------------------------------  Indications: Pulmonary embolism.  Comparison Study: No previous exams Performing Technologist: Jody Hill RVT, RDMS  Examination Guidelines: A complete evaluation includes B-mode imaging, spectral Doppler, color Doppler, and power Doppler as needed of all accessible portions of each vessel. Bilateral  testing is considered an integral part of a complete examination. Limited examinations for reoccurring indications may be performed as noted. The reflux portion of the exam is performed with the patient in

## 2022-01-28 NOTE — Procedures (Signed)
Interventional Radiology Procedure: ? ? ?Indications: Submassive PE with right heart strain ? ?Procedure: Placement of bilateral PA catheters and starting TPA infusion. ? ?Findings: Main PA pressure 43/13 (25 mean).  Bilateral infusion catheters placed in lower lobe PAs ? ?Complications: No immediate complications noted. ?    ?EBL: Minimal ? ?Plan: Plan for bilateral PA infusions at 1 mg/hr through each catheter for total of 12 hours (total dose 24 mg TPA).   ? ? ?Jessye Imhoff R. Lowella Dandy, MD  ?Pager: [phone] ? ? ? ?  ?

## 2022-01-28 NOTE — Sedation Documentation (Signed)
Patient transported to 2 M ICU. TPA infusing in pulmonary catheters at this time with saline at 20 ml/hr ?

## 2022-01-28 NOTE — Assessment & Plan Note (Signed)
-  Likely secondary to right heart strain and PE ?

## 2022-01-28 NOTE — Progress Notes (Signed)
CT scan findings noted ? ?CT with right heart strain ?Echo with right heart strain and moderate pulmonary hypertension with right ventricular dysfunction, elevated troponin and BNP ? ?Consulted IR for possible catheter directed thrombolytics ? ?

## 2022-01-28 NOTE — Progress Notes (Signed)
? ?  NAME:  Carrie [name], MRN:  [id number], DOB:  02/27/1961, LOS: 1 ?ADMISSION DATE:  01/27/2022, CONSULTATION DATE: 01/27/2022 ?REFERRING MD: Dr. Roosevelt Locks , CHIEF COMPLAINT: Shortness of breath, pulmonary embolism ? ?History of Present Illness:  ?Patient presented to the hospital with chest tightness, shortness of breath and weakness worsening today ?She had 1 week of shortness of breath ?She also did notice leg swelling on the day prior to presentation ?On the day of presentation noticed chest tightness ?Has a history of varicose veins ? ?History of anaphylaxis with contrast--she remembers lateral swelling ? ?Pertinent  Medical History  ? ?Past Medical History:  ?Diagnosis Date  ? Arthritis   ? High cholesterol   ? Kidney stones   ? Ovarian cyst   ? ?Significant Hospital Events: ?Including procedures, antibiotic start and stop dates in addition to other pertinent events   ?Ultrasound of the lower extremity positive for DVT ?Elevated D-dimer ? ?Interim History / Subjective:  ?Ready to use contrast for CT study ? ?Objective   ?Blood pressure 107/71, pulse 85, temperature 97.6 ?F (36.4 ?C), temperature source Oral, resp. rate 17, height 5\' 3"  (1.6 m), weight 93 kg, SpO2 98 %. ?   ?   ? ?Intake/Output Summary (Last 24 hours) at 01/28/2022 1021 ?Last data filed at 01/28/2022 0300 ?Gross per 24 hour  ?Intake 234.27 ml  ?Output --  ?Net 234.27 ml  ? ?Filed Weights  ? 01/27/22 0829  ?Weight: 93 kg  ? ? ?Examination: ?General: 61-year-old female in no acute distress ?HEENT: MM pink/moist no JVD ?Neuro: Grossly intact ?CV: Heart sounds regular ?PULM: Diminished in the bases ? ?GI: soft, bsx4 active  ?GU: Voids ?Extremities: warm/dry, 1-2+ edema  ?Skin: no rashes or lesions ? ? ?Echocardiogram ?-With evidence of right ventricular strain, moderately enlarged, right ventricular systolic function is moderately reduced with moderately elevated pulmonary artery systolic pressure ? ?BNP mildly elevated, troponin elevated ? ? ?Resolved Hospital  Problem list   ? ? ?Assessment & Plan:  ?Pulmonary embolism  ?She now agrees to contrast therefore CT scan to evaluate the extent of the pulmonary embolism and whether or not it needs invasive intervention will be done at this time. ? ?Richardson Landry Donnella Morford ACNP ?Acute Care Nurse Practitioner ?[place] ?Please consult Amion ?01/28/2022, 10:24 AM ? ? ? ? ?

## 2022-01-28 NOTE — Progress Notes (Signed)
ANTICOAGULATION CONSULT NOTE- follow-up ?Pharmacy Consult for Heparin ?Indication: pulmonary embolus ? ?Allergies  ?Allergen Reactions  ? Iodinated Contrast Media Anaphylaxis  ? Gluten Meal Nausea Only  ?  Makes stomach hurt also  ? Lactose Intolerance (Gi) Diarrhea and Nausea Only  ?  Makes stomach hurt also  ? Other Swelling and Rash  ?  Radioactive dyes, heavy metals like nickel  ? Wheat Bran Diarrhea and Nausea Only  ?  Makes stomach hurt also  ? Cephalexin Itching  ? ? ?Patient Measurements: ?Height: 5\' 3"  (160 cm) ?Weight: 93 kg (205 lb) ?IBW/kg (Calculated) : 52.4 ?Heparin Dosing Weight: 73.7 kg ? ?Vital Signs: ?Temp: 98.5 ?F (36.9 ?C) (04/09 0450) ?Temp Source: Oral (04/09 0450) ?BP: 91/64 (04/09 0450) ?Pulse Rate: 88 (04/09 0450) ? ?Labs: ?Recent Labs  ?  01/27/22 ?0853 01/27/22 ?1031 01/27/22 ?1134 01/27/22 ?1618 01/27/22 ?2006 01/28/22 ?0145  ?HGB 13.7  --   --   --   --  12.0  ?HCT 44.3  --   --   --   --  37.0  ?PLT 120*  --   --   --   --  166  ?LABPROT  --   --  13.3  --   --   --   ?INR  --   --  1.0  --   --   --   ?HEPARINUNFRC  --   --   --   --  0.66 0.67  ?CREATININE 0.68  --   --   --   --   --   ?TROPONINIHS 187* 278*  --  423*  --   --   ? ? ? ?Estimated Creatinine Clearance: 81 mL/min (by C-G formula based on SCr of 0.68 mg/dL). ? ? ?Medical History: ?Past Medical History:  ?Diagnosis Date  ? Arthritis   ? High cholesterol   ? Kidney stones   ? Ovarian cyst   ? ? ?Medications:  ?Medications Prior to Admission  ?Medication Sig Dispense Refill Last Dose  ? carbamide peroxide (DEBROX) 6.5 % OTIC solution Place 5 drops into the right ear 2 (two) times daily. 15 mL 0 Past Week  ? meloxicam (MOBIC) 7.5 MG tablet Take 1 tablet (7.5 mg total) by mouth daily. 30 tablet 0 01/26/2022  ? ibuprofen (ADVIL,MOTRIN) 600 MG tablet Take 1 tablet (600 mg total) by mouth every 6 (six) hours as needed. (Patient not taking: Reported on 01/27/2022) 30 tablet 0 Not Taking  ? traMADol (ULTRAM) 50 MG tablet Take 1 tablet  (50 mg total) by mouth every 6 (six) hours as needed. (Patient not taking: Reported on 01/27/2022) 15 tablet 0 Not Taking  ? ? ?Scheduled:  ?Infusions:  ?PRN:  ? ?Assessment: ?27 yof with a history of Arthritis, HLD. Patient is presenting with chest pain and SOB. Heparin initially started for ACS. Request from EDP to change heparin dosing to dosing for pulmonary embolus. ? ?Patient is not on anticoagulation prior to arrival. ? ?Initial heparin level therapeutic at 0.67 ? ?Goal of Therapy:  ?Heparin level 0.3-0.7 units/ml ?Monitor platelets by anticoagulation protocol: Yes ?  ?Plan:  ?Continue heparin 1300 units/h ?Heparin level in am ? ?Landy Dunnavant BS, PharmD, BCPS ?Clinical Pharmacist ?01/28/2022 7:17 AM ? ? ? ? ?

## 2022-01-28 NOTE — Progress Notes (Signed)
Patient updated by CT scan findings ? ?Will transfer to ICU for possible catheter directed thrombolysis by IR ?

## 2022-01-28 NOTE — Progress Notes (Signed)
?PROGRESS NOTE ? ? ? ?Carrie [name]  [id number] DOB: Feb 17, 1961 DOA: 01/27/2022 ?PCP: Pcp, No  ? ? ?Brief Narrative:  ?Carrie [name] is a 61 y.o. female with medical history significant of bilateral knee OA, obesity, presented with new onset of chest pain palpitation and shortness of breath.  Found to have DVT and presumed PE-- CTA pending.  Family h/o clots in mother.   ? ? ?Assessment and Plan: ?Sinus tachycardia ?SVT ?-Broke with valsalva maneuvers  ?-Likely precipitated by PE  ?-Dr Curt Bears wrote for cardizem daily  ? ?Elevated troponin ?-Likely secondary to right heart strain and PE ? ?Acute pulmonary embolism ([place]) ?-suspected- CTA pending after pre-medication ?-d dimer elevated ?R leg DVT on U/S ?-heparin ?-appreciate PCCM consult ?+family history, Outpatient hematology for hypercoagulable state study.  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 ? ? ? ? ? ?DVT prophylaxis: heparin gtt ? ?  Code Status: Full Code ?Family Communication:  ? ?Disposition Plan:  ?Level of care: Telemetry Medical ?Status is: Inpatient ?Remains inpatient appropriate because: needs CTA ?  ? ?Consultants:  ?PCCM ?cards ? ? ?Subjective: ?In bed, very SOB with walking to bathroom ? ?Objective: ?Vitals:  ? 01/27/22 2325 01/28/22 0450 01/28/22 0725 01/28/22 1157  ?BP: 106/67 91/64 107/71 131/76  ?Pulse: 92 88 85 86  ?Resp: 20 19 17 20   ?Temp: 98.7 ?F (37.1 ?C) 98.5 ?F (36.9 ?C) 97.6 ?F (36.4 ?C) 98.1 ?F (36.7 ?C)  ?TempSrc: Oral Oral Oral Oral  ?SpO2: 93% 93% 98% 95%  ?Weight:      ?Height:      ? ? ?Intake/Output Summary (Last 24 hours) at 01/28/2022 1405 ?Last data filed at 01/28/2022 0300 ?Gross per 24 hour  ?Intake 234.27 ml  ?Output --  ?Net 234.27 ml  ? ?Filed Weights  ? 01/27/22 0829  ?Weight: 93 kg  ? ? ?Examination: ? ? ?General: Appearance:    Obese female in no acute distress  ?   ?Lungs:    respirations mildly increased with exertion  ?Heart:    Normal heart rate. ?  ?MS:   All extremities  are intact.  ?  ?Neurologic:   Awake, alert, oriented x 3.  ?  ? ? ? ?Data Reviewed: I have personally reviewed following labs and imaging studies ? ?CBC: ?Recent Labs  ?Lab 01/27/22 ?[id number] 01/28/22 ?0145  ?WBC 5.5 6.5  ?HGB 13.7 12.0  ?HCT 44.3 37.0  ?MCV 86.9 83.1  ?PLT 120* 166  ? ?Basic Metabolic Panel: ?Recent Labs  ?Lab 01/27/22 ?[id number]  ?NA 139  ?K 4.0  ?CL 107  ?CO2 21*  ?GLUCOSE 98  ?BUN 16  ?CREATININE 0.68  ?CALCIUM 9.0  ? ?GFR: ?Estimated Creatinine Clearance: 81 mL/min (by C-G formula based on SCr of 0.68 mg/dL). ?Liver Function Tests: ?No results for input(s): AST, ALT, ALKPHOS, BILITOT, PROT, ALBUMIN in the last 168 hours. ?No results for input(s): LIPASE, AMYLASE in the last 168 hours. ?No results for input(s): AMMONIA in the last 168 hours. ?Coagulation Profile: ?Recent Labs  ?Lab 01/27/22 ?1134  ?INR 1.0  ? ?Cardiac Enzymes: ?No results for input(s): CKTOTAL, CKMB, CKMBINDEX, TROPONINI in the last 168 hours. ?BNP (last 3 results) ?No results for input(s): PROBNP in the last 8760 hours. ?HbA1C: ?No results for input(s): HGBA1C in the last 72 hours. ?CBG: ?Recent Labs  ?Lab 01/27/22 ?2139  ?GLUCAP 120*  ? ?Lipid Profile: ?No results for input(s): CHOL, HDL, LDLCALC, TRIG, CHOLHDL, LDLDIRECT in the last 72 hours. ?Thyroid  Function Tests: ?No results for input(s): TSH, T4TOTAL, FREET4, T3FREE, THYROIDAB in the last 72 hours. ?Anemia Panel: ?No results for input(s): VITAMINB12, FOLATE, FERRITIN, TIBC, IRON, RETICCTPCT in the last 72 hours. ?Sepsis Labs: ?No results for input(s): PROCALCITON, LATICACIDVEN in the last 168 hours. ? ?Recent Results (from the past 240 hour(s))  ?Resp Panel by RT-PCR (Flu A&B, Covid) Nasopharyngeal Swab     Status: None  ? Collection Time: 01/27/22  8:45 AM  ? Specimen: Nasopharyngeal Swab; Nasopharyngeal(NP) swabs in vial transport medium  ?Result Value Ref Range Status  ? SARS Coronavirus 2 by RT PCR NEGATIVE NEGATIVE Final  ?  Comment: (NOTE) ?SARS-CoV-2 target nucleic acids  are NOT DETECTED. ? ?The SARS-CoV-2 RNA is generally detectable in upper respiratory ?specimens during the acute phase of infection. The lowest ?concentration of SARS-CoV-2 viral copies this assay can detect is ?138 copies/mL. A negative result does not preclude SARS-Cov-2 ?infection and should not be used as the sole basis for treatment or ?other patient management decisions. A negative result may occur with  ?improper specimen collection/handling, submission of specimen other ?than nasopharyngeal swab, presence of viral mutation(s) within the ?areas targeted by this assay, and inadequate number of viral ?copies(<138 copies/mL). A negative result must be combined with ?clinical observations, patient history, and epidemiological ?information. The expected result is Negative. ? ?Fact Sheet for Patients:  ?EntrepreneurPulse.com.au ? ?Fact Sheet for Healthcare Providers:  ?IncredibleEmployment.be ? ?This test is no t yet approved or cleared by the [place] FDA and  ?has been authorized for detection and/or diagnosis of SARS-CoV-2 by ?FDA under an Emergency Use Authorization (EUA). This EUA will remain  ?in effect (meaning this test can be used) for the duration of the ?COVID-19 declaration under Section 564(b)(1) of the Act, 21 ?U.S.C.section 360bbb-3(b)(1), unless the authorization is terminated  ?or revoked sooner.  ? ? ?  ? Influenza A by PCR NEGATIVE NEGATIVE Final  ? Influenza B by PCR NEGATIVE NEGATIVE Final  ?  Comment: (NOTE) ?The Xpert Xpress SARS-CoV-2/FLU/RSV plus assay is intended as an aid ?in the diagnosis of influenza from Nasopharyngeal swab specimens and ?should not be used as a sole basis for treatment. Nasal washings and ?aspirates are unacceptable for Xpert Xpress SARS-CoV-2/FLU/RSV ?testing. ? ?Fact Sheet for Patients: ?EntrepreneurPulse.com.au ? ?Fact Sheet for Healthcare Providers: ?IncredibleEmployment.be ? ?This test is  not yet approved or cleared by the [place] FDA and ?has been authorized for detection and/or diagnosis of SARS-CoV-2 by ?FDA under an Emergency Use Authorization (EUA). This EUA will remain ?in effect (meaning this test can be used) for the duration of the ?COVID-19 declaration under Section 564(b)(1) of the Act, 21 U.S.C. ?section 360bbb-3(b)(1), unless the authorization is terminated or ?revoked. ? ?Performed at [facility], [place] [address], [place], [place] ?[address] ?  ?  ? ? ? ? ? ?Radiology Studies: ?DG Chest Portable 1 View ? ?Result Date: 01/27/2022 ?CLINICAL DATA:  Shortness of breath, chest pain with heaviness, weakness. EXAM: PORTABLE CHEST 1 VIEW COMPARISON:  Chest x-ray dated 01/09/2021. FINDINGS: Heart size and mediastinal contours are stable. Lungs are clear. No pleural effusion or pneumothorax is seen. Osseous structures about the chest are unremarkable. IMPRESSION: No active disease.  No evidence of pneumonia or pulmonary edema. Electronically Signed   By: Franki Cabot M.D.   On: 01/27/2022 11:55  ? ?ECHOCARDIOGRAM COMPLETE ? ?Result Date: 01/27/2022 ?   ECHOCARDIOGRAM REPORT   Patient Name:   ARMINDA THORNES Date of Exam: 01/27/2022 Medical Rec #:  [id number]     Height:       63.0 in Accession #:    [id number]    Weight:       205.0 lb Date of Birth:  1961/04/01     BSA:          1.954 m? Patient Age:    84 years      BP:           108/76 mmHg Patient Gender: F             HR:           97 bpm. Exam Location:  Inpatient Procedure: 2D Echo, Color Doppler and Cardiac Doppler STAT ECHO Indications:    Elevated Troponins  History:        Patient has no prior history of Echocardiogram examinations.                 Risk Factors:Dyslipidemia.  Sonographer:    Raquel Sarna Senior RDCS Referring Phys: [id number] [place]  1. Echo findings concerning for acute PE Discussed with ER V/Q pending.  2. Left ventricular ejection fraction, by estimation, is 60 to 65%. The left ventricle has normal  function. The left ventricle has no regional wall motion abnormalities. Left ventricular diastolic parameters were normal.  3. Right ventricular systolic function is moderately reduced. The right ventri

## 2022-01-28 NOTE — Assessment & Plan Note (Signed)
SVT ?-Broke with valsalva maneuvers  ?-Likely precipitated by PE  ?-Dr Elberta Fortis wrote for cardizem daily  ?

## 2022-01-28 NOTE — Sedation Documentation (Signed)
Main PA Pressure 43/13 (25) ?

## 2022-01-28 NOTE — Progress Notes (Signed)
? ?NAME:  Carrie [name], MRN:  [id number], DOB:  [birth year], LOS: 1 ?ADMISSION DATE:  01/27/2022, CONSULTATION DATE:  01/27/2022 ?REFERRING MD: Dr. Roosevelt Locks, CHIEF COMPLAINT: Shortness of breath, pulmonary embolism ? ?History of Present Illness:  ?Patient presented to the hospital with chest tightness, shortness of breath and weakness worsening today ?She had 1 week of shortness of breath ?She also did notice leg swelling on the day prior to presentation ?On the day of presentation noticed chest tightness ?Has a history of varicose veins ?  ?History of anaphylaxis with contrast--she remembers lateral swelling ? ?Pertinent  Medical History  ? ?Past Medical History:  ?Diagnosis Date  ? Arthritis   ? High cholesterol   ? Kidney stones   ? Ovarian cyst   ? ?Significant Hospital Events: ?Including procedures, antibiotic start and stop dates in addition to other pertinent events   ?Ultrasound of the lower extremity positive for DVT ?Elevated D-dimer ?Right heart strain on echo ? ?Interim History / Subjective:  ?Some chest discomfort with meals ?Shortness of breath with any activity ? ?Objective   ?Blood pressure 107/71, pulse 85, temperature 97.6 ?F (36.4 ?C), temperature source Oral, resp. rate 17, height 5\' 3"  (1.6 m), weight 93 kg, SpO2 98 %. ?   ?   ? ?Intake/Output Summary (Last 24 hours) at 01/28/2022 1018 ?Last data filed at 01/28/2022 0300 ?Gross per 24 hour  ?Intake 234.27 ml  ?Output --  ?Net 234.27 ml  ? ?Filed Weights  ? 01/27/22 0829  ?Weight: 93 kg  ? ? ?Examination: ?General: Middle-age does not appear to be in acute distress ?HENT: Moist oral mucosa ?Lungs: Clear breath sounds ?Cardiovascular: S1-S2 appreciated ?Abdomen: Bowel sounds appreciated ?Extremities: No clubbing, no edema ?Neuro: Alert and oriented x3, moving all extremities ?GU:  ? ?Echocardiogram notable for right ventricular strain, elevated pulmonary pressures ? ?Resolved Hospital Problem list   ? ? ?Assessment & Plan:  ?Submassive pulmonary  embolism ?-Unclear clot load as patient did initially refuse contrast because of a history of dye allergy ?-Had also initially refused V/Q scanning ? ?She was continued on heparin ? ?Continue 2 L oxygen supplementation ? ?She is now amenable to having CT performed ?-We will premedicate with hydrocortisone 200 mg and diphenhydramine ?-Fluid resuscitation with saline ? ?Risk of decompensation remains significant ?Continue oxygen supplementation, continue anticoagulation ? ?Risk with contrast allergy-premedication ? ?Best Practice (right click and "Reselect all SmartList Selections" daily)  ?Per primary service ? ?Labs   ?CBC: ?Recent Labs  ?Lab 01/27/22 ?[id number] 01/28/22 ?0145  ?WBC 5.5 6.5  ?HGB 13.7 12.0  ?HCT 44.3 37.0  ?MCV 86.9 83.1  ?PLT 120* 166  ? ? ?Basic Metabolic Panel: ?Recent Labs  ?Lab 01/27/22 ?[id number]  ?NA 139  ?K 4.0  ?CL 107  ?CO2 21*  ?GLUCOSE 98  ?BUN 16  ?CREATININE 0.68  ?CALCIUM 9.0  ? ?GFR: ?Estimated Creatinine Clearance: 81 mL/min (by C-G formula based on SCr of 0.68 mg/dL). ?Recent Labs  ?Lab 01/27/22 ?[id number] 01/28/22 ?0145  ?WBC 5.5 6.5  ? ? ?Liver Function Tests: ?No results for input(s): AST, ALT, ALKPHOS, BILITOT, PROT, ALBUMIN in the last 168 hours. ?No results for input(s): LIPASE, AMYLASE in the last 168 hours. ?No results for input(s): AMMONIA in the last 168 hours. ? ?ABG ?   ?Component Value Date/Time  ? TCO2 24 01/09/2021 0442  ?  ? ?Coagulation Profile: ?Recent Labs  ?Lab 01/27/22 ?1134  ?INR 1.0  ? ? ?Cardiac Enzymes: ?No results for input(s): CKTOTAL, CKMB, CKMBINDEX, TROPONINI  in the last 168 hours. ? ?HbA1C: ?No results found for: HGBA1C ? ?CBG: ?Recent Labs  ?Lab 01/27/22 ?2139  ?GLUCAP 120*  ? ? ?Review of Systems:   ?Some chest discomfort ? ?Past Medical History:  ?She,  has a past medical history of Arthritis, High cholesterol, Kidney stones, and Ovarian cyst.  ? ?Surgical History:  ? ?Past Surgical History:  ?Procedure Laterality Date  ? OVARIAN CYST REMOVAL    ?  ? ?Social  History:  ? reports that she has never smoked. She has never used smokeless tobacco. She reports that she does not drink alcohol and does not use drugs.  ? ?Family History:  ?Her family history includes Pulmonary embolism in her father.  ? ?Allergies ?Allergies  ?Allergen Reactions  ? Iodinated Contrast Media Anaphylaxis  ? Gluten Meal Nausea Only  ?  Makes stomach hurt also  ? Lactose Intolerance (Gi) Diarrhea and Nausea Only  ?  Makes stomach hurt also  ? Other Swelling and Rash  ?  Radioactive dyes, heavy metals like nickel  ? Wheat Bran Diarrhea and Nausea Only  ?  Makes stomach hurt also  ? Cephalexin Itching  ?  ?Sherrilyn Rist, MD ?[facility] ?Pager: See Amion ? ? ? ? ? ?

## 2022-01-29 ENCOUNTER — Other Ambulatory Visit (HOSPITAL_COMMUNITY): Payer: Self-pay

## 2022-01-29 ENCOUNTER — Inpatient Hospital Stay (HOSPITAL_COMMUNITY): Payer: Medicaid Other

## 2022-01-29 HISTORY — PX: IR THROMB F/U EVAL ART/VEN FINAL DAY (MS): IMG5379

## 2022-01-29 LAB — GLUCOSE, CAPILLARY
Glucose-Capillary: 205 mg/dL — ABNORMAL HIGH (ref 70–99)
Glucose-Capillary: 162 mg/dL — ABNORMAL HIGH (ref 70–99)

## 2022-01-29 LAB — CBC
HCT: 41.6 % (ref 36.0–46.0)
HCT: 40.5 % (ref 36.0–46.0)
HCT: 36.5 % (ref 36.0–46.0)
Hemoglobin: 13.5 g/dL (ref 12.0–15.0)
Hemoglobin: 12.9 g/dL (ref 12.0–15.0)
Hemoglobin: 11.8 g/dL — ABNORMAL LOW (ref 12.0–15.0)
MCH: 26.8 pg (ref 26.0–34.0)
MCH: 26.3 pg (ref 26.0–34.0)
MCH: 26.8 pg (ref 26.0–34.0)
MCHC: 32.5 g/dL (ref 30.0–36.0)
MCHC: 31.9 g/dL (ref 30.0–36.0)
MCHC: 32.3 g/dL (ref 30.0–36.0)
MCV: 82.5 fL (ref 80.0–100.0)
MCV: 82.5 fL (ref 80.0–100.0)
MCV: 82.8 fL (ref 80.0–100.0)
Platelets: 152 10*3/uL (ref 150–400)
Platelets: 149 10*3/uL — ABNORMAL LOW (ref 150–400)
Platelets: 139 10*3/uL — ABNORMAL LOW (ref 150–400)
RBC: 5.04 MIL/uL (ref 3.87–5.11)
RBC: 4.91 MIL/uL (ref 3.87–5.11)
RBC: 4.41 MIL/uL (ref 3.87–5.11)
RDW: 14.8 % (ref 11.5–15.5)
RDW: 14.7 % (ref 11.5–15.5)
RDW: 15 % (ref 11.5–15.5)
WBC: 6.1 10*3/uL (ref 4.0–10.5)
WBC: 8.5 10*3/uL (ref 4.0–10.5)
WBC: 13.4 10*3/uL — ABNORMAL HIGH (ref 4.0–10.5)
nRBC: 0 % (ref 0.0–0.2)
nRBC: 0 % (ref 0.0–0.2)
nRBC: 0 % (ref 0.0–0.2)

## 2022-01-29 LAB — FIBRINOGEN
Fibrinogen: 451 mg/dL (ref 210–475)
Fibrinogen: 436 mg/dL (ref 210–475)
Fibrinogen: 357 mg/dL (ref 210–475)

## 2022-01-29 LAB — BASIC METABOLIC PANEL
Anion gap: 8 (ref 5–15)
BUN: 10 mg/dL (ref 6–20)
CO2: 18 mmol/L — ABNORMAL LOW (ref 22–32)
Calcium: 8.8 mg/dL — ABNORMAL LOW (ref 8.9–10.3)
Chloride: 114 mmol/L — ABNORMAL HIGH (ref 98–111)
Creatinine, Ser: 0.6 mg/dL (ref 0.44–1.00)
GFR, Estimated: >60 mL/min (ref >60)
Glucose, Bld: 159 mg/dL — ABNORMAL HIGH (ref 70–99)
Potassium: 3.5 mmol/L (ref 3.5–5.1)
Sodium: 140 mmol/L (ref 135–145)

## 2022-01-29 LAB — HEPARIN LEVEL (UNFRACTIONATED)
Heparin Unfractionated: 0.75 IU/mL — ABNORMAL HIGH (ref 0.30–0.70)
Heparin Unfractionated: 0.83 IU/mL — ABNORMAL HIGH (ref 0.30–0.70)
Heparin Unfractionated: 0.57 IU/mL (ref 0.30–0.70)
Heparin Unfractionated: 0.59 IU/mL (ref 0.30–0.70)

## 2022-01-29 LAB — MRSA NEXT GEN BY PCR, NASAL: MRSA by PCR Next Gen: NOT DETECTED

## 2022-01-29 MED ORDER — POTASSIUM CHLORIDE CRYS ER 20 MEQ PO TBCR
40.0000 meq | EXTENDED_RELEASE_TABLET | Freq: Once | ORAL | Status: AC
  Administered 2022-01-29: 40 meq via ORAL
  Filled 2022-01-29: qty 2

## 2022-01-29 MED ORDER — CHLORHEXIDINE GLUCONATE CLOTH 2 % EX PADS
6.0000 | MEDICATED_PAD | Freq: Every day | CUTANEOUS | Status: DC
  Administered 2022-01-29 – 2022-01-30 (×2): 6 via TOPICAL

## 2022-01-29 NOTE — Progress Notes (Signed)
Patients name and DOB were verified, Right catheter was pulled back 4cm and pressure were measure at 26/9 (17). Both catheters and sheaths were removed and 5 minutes of hemostatic pressure was held. The site was dressed with gauze and a Tegaderm  and RN was notified and checked the site. Dr. Lowella Dandy was updated. ? ?Koree Staheli RT (R) ?

## 2022-01-29 NOTE — Progress Notes (Signed)
? ?Primary Cardiology:  [facility] ?Subjective:  ? ?No CP   No SOB  ?Objective:  ?Vitals:  ? 01/29/22 0800 01/29/22 0900 01/29/22 1000 01/29/22 1100  ?BP: 127/85 128/82 136/88   ?Pulse: 69 65 67 72  ?Resp: 19 17 19 12   ?Temp: 97.7 ?F (36.5 ?C)     ?TempSrc: Oral     ?SpO2: 95% 92% 95% 94%  ?Weight:      ?Height:      ? ? ?Intake/Output from previous day: ? ?Intake/Output Summary (Last 24 hours) at 01/29/2022 1105 ?Last data filed at 01/29/2022 1100 ?Gross per 24 hour  ?Intake 1051.8 ml  ?Output 540 ml  ?Net 511.8 ml  ? ? ?Physical Exam: ?Affect appropriate  Laying in bed flat  ?Healthy:  appears stated age ?HEENT: normal ?Neck Normal JVP  ?Lungs clear with no wheezing and good diaphragmatic motion ?Heart:  S1/S2 no murmur, no rub, ?Abdomen: benighn, BS positve, no tenderness, no AAA ?no bruit.  No HSM or HJR ?Distal pulses intact with no bruits ?No edema ?Neuro non-focal ?Skin warm and dry ?No muscular weakness ? ? ?Lab Results: ?Basic Metabolic Panel: ?Recent Labs  ?  01/27/22 ?[id number] 01/29/22 ?0235  ?NA 139 140  ?K 4.0 3.5  ?CL 107 114*  ?CO2 21* 18*  ?GLUCOSE 98 159*  ?BUN 16 10  ?CREATININE 0.68 0.60  ?CALCIUM 9.0 8.8*  ? ?Liver Function Tests: ?No results for input(s): AST, ALT, ALKPHOS, BILITOT, PROT, ALBUMIN in the last 72 hours. ?No results for input(s): LIPASE, AMYLASE in the last 72 hours. ?CBC: ?Recent Labs  ?  01/29/22 ?[id number] 01/29/22 ?0726  ?WBC 6.1 8.5  ?HGB 13.5 12.9  ?HCT 41.6 40.5  ?MCV 82.5 82.5  ?PLT 152 149*  ? ?Cardiac Enzymes: ?No results for input(s): CKTOTAL, CKMB, CKMBINDEX, TROPONINI in the last 72 hours. ?BNP: ?Invalid input(s): POCBNP ?D-Dimer: ?Recent Labs  ?  01/27/22 ?1122  ?DDIMER 11.30*  ? ? ? ?Imaging: ?CT Angio Chest Pulmonary Embolism (PE) W or WO Contrast ? ?Result Date: 01/28/2022 ?CLINICAL DATA:  Shortness of breath.  DVT. EXAM: CT ANGIOGRAPHY CHEST WITH CONTRAST TECHNIQUE: Multidetector CT imaging of the chest was performed using the standard protocol during bolus administration of  intravenous contrast. Multiplanar CT image reconstructions and MIPs were obtained to evaluate the vascular anatomy. RADIATION DOSE REDUCTION: This exam was performed according to the departmental dose-optimization program which includes automated exposure control, adjustment of the mA and/or kV according to patient size and/or use of iterative reconstruction technique. CONTRAST:  24mL OMNIPAQUE IOHEXOL 350 MG/ML SOLN COMPARISON:  09/17/2012.  Chest radiograph, 01/27/2022. FINDINGS: Cardiovascular: Bilateral pulmonary emboli. On the right, pulmonary emboli extend from the peripheral pulmonary artery into segmental branches to the right upper, middle and lower lobes, nonocclusive. On the left, pulmonary emboli are also noted extending from the peripheral main pulmonary artery into segmental branches to the upper and lower lobes. Anterior segmental branch to the left upper lobe appears occlusive. Heart is normal in size. RV LV ratio, 4.4/2.6 = 1.69. Trace pericardial fluid. Prominent main pulmonary artery measuring 3.7 cm. Normal caliber aorta. No aortic dissection or significant atherosclerosis. Mediastinum/Nodes: No neck base, mediastinal or hilar masses or enlarged lymph nodes. Trachea and esophagus are unremarkable. Lungs/Pleura: Minor atelectasis or scarring at the anterior base of the left upper lobe lingula. Lungs otherwise clear. No pleural effusion or pneumothorax. Upper Abdomen: No acute abnormality. Musculoskeletal: No fracture or acute finding.  No bone lesion. Review of the MIP images confirms the above  findings. IMPRESSION: 1. Multiple bilateral pulmonary emboli. Positive for acute PE with CT evidence of right heart strain (RV/LV Ratio = 1.69) consistent with at least submassive (intermediate risk) PE. The presence of right heart strain has been associated with an increased risk of morbidity and mortality. Please refer to the "PE Focused" order set in EPIC. 2. Lungs essentially clear with no evidence of  infarction. No other acute findings. Electronically Signed   By: Lajean Manes M.D.   On: 01/28/2022 14:40  ? ?IR Angiogram Pulmonary Bilateral Selective ? ?Result Date: 01/29/2022 ?INDICATION: 61 year old with submassive pulmonary embolism. Evidence for right heart strain based on CTA and echocardiography. EXAM: 1. Placement of bilateral pulmonary artery catheters. 2. Bilateral pulmonary arteriography 3. Ultrasound guidance for vascular access x2 COMPARISON:  Chest CTA 01/28/2022 MEDICATIONS: 120 mg Solu-Medrol, 20 mg Pepcid, 1.5 mg Versed, 50 mg Benadryl ANESTHESIA/SEDATION: The patient's level of consciousness and vital signs were monitored continuously by radiology nursing throughout the procedure under my direct supervision. FLUOROSCOPY: Radiation Exposure Index (as provided by the fluoroscopic device): 64 mGy Kerma COMPLICATIONS: None immediate. TECHNIQUE: Informed written consent was obtained from the patient after a thorough discussion of the procedural risks, benefits and alternatives. All questions were addressed. A timeout was performed prior to the initiation of the procedure. Patient received Solu-Medrol, Pepcid and Benadryl due to her contrast allergy. Ultrasound confirmed a patent right internal jugular vein. Ultrasound image was saved for documentation. The right neck was prepped and draped in sterile fashion. Maximal barrier sterile technique was utilized including caps, mask, sterile gowns, sterile gloves, sterile drape, hand hygiene and skin antiseptic. Skin was anesthetized using 1% lidocaine. Using ultrasound guidance, 21 gauge needle was directed into the right internal jugular vein and micropuncture dilator set was placed. A second puncture was made in the right internal jugular vein using ultrasound guidance with a 21 gauge needle. A second micropuncture catheter was placed. Both micropuncture catheters were upsized to 6 [place] vascular sheaths over a Bentson wire. C2 catheter was advanced  through the right-sided IJ sheath into the right heart and eventually into the pulmonary artery. Main pulmonary artery pressure was obtained. The catheter was advanced into right lower lobe pulmonary arteries and selective angiography was performed for catheter placement. A 90 cm, 15 cm infusion length UniFuse catheter was placed over a Rosen wire. C2 catheter was advanced through the other internal jugular sheath. C2 catheter was easily advanced into the right heart and eventually into the left pulmonary artery. Catheter was advanced into a left lower lobe pulmonary artery and selective angiography was used to help with catheter placement. 90 cm, 10 cm infusion length UniFuse catheter was placed into the left pulmonary artery. Both sheaths were sutured in place. Bandage placed over the right neck catheters. TPA infusion was started through both catheters at the end of the procedure. FINDINGS: Main pulmonary artery pressure was 43/13, mean 25 mm Hg Bilateral pulmonary artery catheters were placed. Right catheter was advanced into a right lower lobe pulmonary artery and left catheter was advanced in the left lower lobe pulmonary artery. Placement in lower lobe pulmonary arteries was confirmed with angiography. IMPRESSION: 1. Successful placement of bilateral pulmonary artery catheters and initiation of thrombolytic therapy with tPA. 2. Pulmonary artery pressure was 43/13, mean 25 mm Hg. Electronically Signed   By: Markus Daft M.D.   On: 01/29/2022 09:46  ? ?IR Angiogram Selective Each Additional Vessel ? ?Result Date: 01/29/2022 ?INDICATION: 61 year old with submassive pulmonary embolism. Evidence for right heart  strain based on CTA and echocardiography. EXAM: 1. Placement of bilateral pulmonary artery catheters. 2. Bilateral pulmonary arteriography 3. Ultrasound guidance for vascular access x2 COMPARISON:  Chest CTA 01/28/2022 MEDICATIONS: 120 mg Solu-Medrol, 20 mg Pepcid, 1.5 mg Versed, 50 mg Benadryl  ANESTHESIA/SEDATION: The patient's level of consciousness and vital signs were monitored continuously by radiology nursing throughout the procedure under my direct supervision. FLUOROSCOPY: Radiation Exposure Index (as pro

## 2022-01-29 NOTE — Progress Notes (Signed)
ANTICOAGULATION CONSULT NOTE ?Pharmacy Consult for Heparin ?Indication: pulmonary embolus ? ?Allergies  ?Allergen Reactions  ? Iodinated Contrast Media Anaphylaxis  ? Gluten Meal Nausea Only  ?  Makes stomach hurt also  ? Lactose Intolerance (Gi) Diarrhea and Nausea Only  ?  Makes stomach hurt also  ? Other Swelling and Rash  ?  Radioactive dyes, heavy metals like nickel  ? Wheat Bran Diarrhea and Nausea Only  ?  Makes stomach hurt also  ? Cephalexin Itching  ? ? ?Patient Measurements: ?Height: 5\' 3"  (160 cm) ?Weight: 93 kg (205 lb) ?IBW/kg (Calculated) : 52.4 ?Heparin Dosing Weight: 73.7 kg ? ?Vital Signs: ?Temp: 97.7 ?F (36.5 ?C) (04/10 0800) ?Temp Source: Oral (04/10 0800) ?BP: 128/82 (04/10 0900) ?Pulse Rate: 65 (04/10 0900) ? ?Labs: ?Recent Labs  ?  01/27/22 ?0853 01/27/22 ?1031 01/27/22 ?1134 01/27/22 ?1618 01/27/22 ?2006 01/28/22 ?2222 01/29/22 ?03/31/22 01/29/22 ?0726  ?HGB 13.7  --   --   --    < > 12.9 13.5 12.9  ?HCT 44.3  --   --   --    < > 40.9 41.6 40.5  ?PLT 120*  --   --   --    < > 162 152 149*  ?LABPROT  --   --  13.3  --   --   --   --   --   ?INR  --   --  1.0  --   --   --   --   --   ?HEPARINUNFRC  --   --   --   --    < > 0.58 0.75* 0.83*  ?CREATININE 0.68  --   --   --   --   --  0.60  --   ?TROPONINIHS 187* 278*  --  423*  --   --   --   --   ? < > = values in this interval not displayed.  ? ? ?Estimated Creatinine Clearance: 81 mL/min (by C-G formula based on SCr of 0.6 mg/dL). ? ? ?Medical History: ?Past Medical History:  ?Diagnosis Date  ? Arthritis   ? High cholesterol   ? Kidney stones   ? Ovarian cyst   ? ? ?Medications:  ?Medications Prior to Admission  ?Medication Sig Dispense Refill Last Dose  ? carbamide peroxide (DEBROX) 6.5 % OTIC solution Place 5 drops into the right ear 2 (two) times daily. 15 mL 0 Past Week  ? meloxicam (MOBIC) 7.5 MG tablet Take 1 tablet (7.5 mg total) by mouth daily. 30 tablet 0 01/26/2022  ? ibuprofen (ADVIL,MOTRIN) 600 MG tablet Take 1 tablet (600 mg total) by  mouth every 6 (six) hours as needed. (Patient not taking: Reported on 01/27/2022) 30 tablet 0 Not Taking  ? traMADol (ULTRAM) 50 MG tablet Take 1 tablet (50 mg total) by mouth every 6 (six) hours as needed. (Patient not taking: Reported on 01/27/2022) 15 tablet 0 Not Taking  ? ? ?Scheduled:  ?Infusions:  ?PRN:  ? ?Assessment: ?78 yof with a history of Arthritis, HLD. Patient is presenting with chest pain and SOB. Heparin initially started for ACS. Request from EDP to change heparin dosing to dosing for pulmonary embolus. ? ?Patient is not on anticoagulation prior to arrival. ? ?Heparin level therapeutic at 0.67>>0.83 on current rate of 1300 units/hr (alteplase lysis completing soon).  ? ?4/9 PM update:  ?Heparin level is therapeutic ?Currently undergoing lysis ? ? ?Goal of Therapy:  ?Heparin level 0.3-0.7 units/ml during lysis  per consult notes ?Monitor platelets by anticoagulation protocol: Yes ?  ?Plan:  ?Reduce heparin to 1200 units/hr ?Q6H heparin levels during lysis ? ?Link Snuffer, PharmD, BCPS, BCCCP ?Clinical Pharmacist ?Please refer to [facility] for [facility] Pharm[facility] numbers ?01/29/2022, 9:07 AM ? ? ? ? ? ?

## 2022-01-29 NOTE — Progress Notes (Signed)
[facility] ADULT ICU REPLACEMENT PROTOCOL ? ? ?The patient does apply for the [facility] Adult ICU Electrolyte Replacment Protocol based on the criteria listed below:  ? ?1.Exclusion criteria: TCTS patients, ECMO patients, and Dialysis patients ?2. Is GFR >/= 30 ml/min? Yes.    ?Patient's GFR today is >60 ?3. Is SCr </= 2? No. ?Patient's SCr is 0.60 mg/dL ?4. Did SCr increase >/= 0.5 in 24 hours? No. ?5.Pt's weight >40kg  Yes.   ?6. Abnormal electrolyte(s): K  ?7. Electrolytes replaced per protocol ?8.  Call MD STAT for K+ </= 2.5, Phos </= 1, or Mag </= 1 ?Physician:  Vladimir Faster ? ?Aneisa Karren E Juni Glaab 01/29/2022 3:48 AM  ?

## 2022-01-29 NOTE — Progress Notes (Signed)
? ?NAME:  Carrie [name], MRN:  [id number], DOB:  [birth year], LOS: 2 ?ADMISSION DATE:  01/27/2022, CONSULTATION DATE:  01/27/22 ?REFERRING MD:  Dr. Roosevelt Locks, CHIEF COMPLAINT:  Dyspnea and PE  ? ?History of Present Illness:  ?Patient is 61 year old woman with past medical hx of HLD, and arthritis that presented to the ED with dyspnea and weakness. Started out one week ago and progressively got worse. Had leg swelling on day of presentation.  ?Pertinent  Medical History  ?Hyperlipidemia ?Arthritis ?Kidney stones ?Significant Hospital Events: ?Including procedures, antibiotic start and stop dates in addition to other pertinent events   ?04/08: Presented to the ED. IV heparin started. PCCM consulted.  ?04/09: Thrombolytics started.  ?Interim History / Subjective:  ?Denied any acute concerns. Stated she recently had a 4 hour non-stop car ride one week ago and the shortness of breath started after that.   ?Review of Systems:   ?Negative unless stated in the subjective.  ?Objective   ?Blood pressure 128/82, pulse 65, temperature 97.7 ?F (36.5 ?C), temperature source Oral, resp. rate 17, height 5\' 3"  (1.6 m), weight 93 kg, SpO2 92 %. ?   ?   ? ?Intake/Output Summary (Last 24 hours) at 01/29/2022 0928 ?Last data filed at 01/29/2022 0800 ?Gross per 24 hour  ?Intake 1769.65 ml  ?Output 300 ml  ?Net 1469.65 ml  ? ?Filed Weights  ? 01/27/22 0829  ?Weight: 93 kg  ?Examination: ?General: NAD ?HENT: NCAT ?Lungs: CTAB ?Cardiovascular: Normal sinus rhythm, no r/m/g. 2+ radial pulses ?Abdomen: Normal bowel sounds, no TTP. ?Extremities: No gross deformity. LE appear same size upon examination. ?Neuro: alert and oriented x4.  ?Consults  ? ?Resolved Hospital Problem list   ? ?Assessment & Plan:  ?Acute hypoxic respiratory failure 2/2 to Submassive PE ?Acute right DVT ?Patient s/p thrombolytics. Currently on heparin gtt. Continue heparin gtt. Satting well on RA.  ?-CTM ?-Will switch to PO anticoagulant.  ?-Will follow up pulmonary outpatient for  further work up due to her family hx of coagulopathy.    ?Hx of SVT ?Patient has hx of SVT. Episodes appears 2/2 to PE. On diltiazem 120 mg daily.  In NSR on exam.  ?Arthritis ?Chronic. Home med is Mobic and Tramadol. Continue Mobic 7.5 Normal renal function noted.  ?-Continue Mobic 7.5 mg daily. CTM. ?Best Practice (right click and "Reselect all SmartList Selections" daily)  ?Diet/type: Regular consistency (see orders) ?DVT prophylaxis: systemic heparin ?GI prophylaxis: N/A ?Lines: N/A ?Foley:  N/A ?Continuous: IV heparin  ?Code Status:  full code ?Last date of family [Plan to update aunt karen today per patient request] ?Labs   ?CBC: ?Recent Labs  ?Lab 01/27/22 ?0853 01/28/22 ?0145 01/28/22 ?2222 01/29/22 ?[id number] 01/29/22 ?0726  ?WBC 5.5 6.5 5.0 6.1 8.5  ?HGB 13.7 12.0 12.9 13.5 12.9  ?HCT 44.3 37.0 40.9 41.6 40.5  ?MCV 86.9 83.1 83.5 82.5 82.5  ?PLT 120* 166 162 152 149*  ? ?Basic Metabolic Panel: ?Recent Labs  ?Lab 01/27/22 ?0853 01/29/22 ?0235  ?NA 139 140  ?K 4.0 3.5  ?CL 107 114*  ?CO2 21* 18*  ?GLUCOSE 98 159*  ?BUN 16 10  ?CREATININE 0.68 0.60  ?CALCIUM 9.0 8.8*  ? ?GFR: ?Estimated Creatinine Clearance: 81 mL/min (by C-G formula based on SCr of 0.6 mg/dL). ?Recent Labs  ?Lab 01/28/22 ?0145 01/28/22 ?2222 01/29/22 ?[id number] 01/29/22 ?0726  ?WBC 6.5 5.0 6.1 8.5  ? ?Liver Function Tests: ?No results for input(s): AST, ALT, ALKPHOS, BILITOT, PROT, ALBUMIN in the last 168 hours. ?No  results for input(s): LIPASE, AMYLASE in the last 168 hours. ?No results for input(s): AMMONIA in the last 168 hours. ?ABG ?   ?Component Value Date/Time  ? TCO2 24 01/09/2021 0442  ?  ?Coagulation Profile: ?Recent Labs  ?Lab 01/27/22 ?1134  ?INR 1.0  ? ?Cardiac Enzymes: ?No results for input(s): CKTOTAL, CKMB, CKMBINDEX, TROPONINI in the last 168 hours. ?HbA1C: ?No results found for: HGBA1C ?CBG: ?Recent Labs  ?Lab 01/27/22 ?2139 01/28/22 ?1827 01/29/22 ?0322  ?GLUCAP 120* 126* 205*  ? ?Past Medical History:  ?She,  has a past medical  history of Arthritis, High cholesterol, Kidney stones, and Ovarian cyst.  ?Surgical History:  ? ?Past Surgical History:  ?Procedure Laterality Date  ? OVARIAN CYST REMOVAL    ?  ?Social History:  ? reports that she has never smoked. She has never used smokeless tobacco. She reports that she does not drink alcohol and does not use drugs.  ?Family History:  ?Her family history includes Pulmonary embolism in her father.  ?Allergies ?Allergies  ?Allergen Reactions  ? Iodinated Contrast Media Anaphylaxis  ? Gluten Meal Nausea Only  ?  Makes stomach hurt also  ? Lactose Intolerance (Gi) Diarrhea and Nausea Only  ?  Makes stomach hurt also  ? Other Swelling and Rash  ?  Radioactive dyes, heavy metals like nickel  ? Wheat Bran Diarrhea and Nausea Only  ?  Makes stomach hurt also  ? Cephalexin Itching  ?  ?Home Medications  ?Prior to Admission medications   ?Medication Sig Start Date End Date Taking? Authorizing Provider  ?carbamide peroxide (DEBROX) 6.5 % OTIC solution Place 5 drops into the right ear 2 (two) times daily. 11/01/18  Yes Hayden Rasmussen, MD  ?meloxicam (MOBIC) 7.5 MG tablet Take 1 tablet (7.5 mg total) by mouth daily. 11/30/21  Yes Lamptey, Myrene Galas, MD  ?ibuprofen (ADVIL,MOTRIN) 600 MG tablet Take 1 tablet (600 mg total) by mouth every 6 (six) hours as needed. ?Patient not taking: Reported on 01/27/2022 01/23/15   Noe Gens, PA-C  ?traMADol (ULTRAM) 50 MG tablet Take 1 tablet (50 mg total) by mouth every 6 (six) hours as needed. ?Patient not taking: Reported on 01/27/2022 02/26/19   Kinnie Feil, PA-C  ?Idamae Schuller, MD ?Tillie Rung. [facility] ?Internal Medicine Residency, PGY-1  ?

## 2022-01-29 NOTE — Progress Notes (Signed)
ANTICOAGULATION CONSULT NOTE ?Pharmacy Consult for Heparin ?Indication: pulmonary embolus ? ?Allergies  ?Allergen Reactions  ? Iodinated Contrast Media Anaphylaxis  ? Gluten Meal Nausea Only  ?  Makes stomach hurt also  ? Lactose Intolerance (Gi) Diarrhea and Nausea Only  ?  Makes stomach hurt also  ? Other Swelling and Rash  ?  Radioactive dyes, heavy metals like nickel  ? Wheat Bran Diarrhea and Nausea Only  ?  Makes stomach hurt also  ? Cephalexin Itching  ? ? ?Patient Measurements: ?Height: 5\' 3"  (160 cm) ?Weight: 93 kg (205 lb) ?IBW/kg (Calculated) : 52.4 ?Heparin Dosing Weight: 73.7 kg ? ?Vital Signs: ?Temp: 98.3 ?F (36.8 ?C) (04/10 1551) ?Temp Source: Oral (04/10 1551) ?BP: 100/61 (04/10 1600) ?Pulse Rate: 81 (04/10 1600) ? ?Labs: ?Recent Labs  ?  01/27/22 ?0853 01/27/22 ?1031 01/27/22 ?1134 01/27/22 ?1618 01/27/22 ?2006 01/29/22 ?03/31/22 01/29/22 ?03/31/22 01/29/22 ?1601  ?HGB 13.7  --   --   --    < > 13.5 12.9 11.8*  ?HCT 44.3  --   --   --    < > 41.6 40.5 36.5  ?PLT 120*  --   --   --    < > 152 149* 139*  ?LABPROT  --   --  13.3  --   --   --   --   --   ?INR  --   --  1.0  --   --   --   --   --   ?HEPARINUNFRC  --   --   --   --    < > 0.75* 0.83* 0.57  ?CREATININE 0.68  --   --   --   --  0.60  --   --   ?TROPONINIHS 187* 278*  --  423*  --   --   --   --   ? < > = values in this interval not displayed.  ? ? ? ?Estimated Creatinine Clearance: 81 mL/min (by C-G formula based on SCr of 0.6 mg/dL). ? ? ?Medical History: ?Past Medical History:  ?Diagnosis Date  ? Arthritis   ? High cholesterol   ? Kidney stones   ? Ovarian cyst   ? ? ?Medications:  ?Medications Prior to Admission  ?Medication Sig Dispense Refill Last Dose  ? carbamide peroxide (DEBROX) 6.5 % OTIC solution Place 5 drops into the right ear 2 (two) times daily. 15 mL 0 Past Week  ? meloxicam (MOBIC) 7.5 MG tablet Take 1 tablet (7.5 mg total) by mouth daily. 30 tablet 0 01/26/2022  ? ibuprofen (ADVIL,MOTRIN) 600 MG tablet Take 1 tablet (600 mg total)  by mouth every 6 (six) hours as needed. (Patient not taking: Reported on 01/27/2022) 30 tablet 0 Not Taking  ? traMADol (ULTRAM) 50 MG tablet Take 1 tablet (50 mg total) by mouth every 6 (six) hours as needed. (Patient not taking: Reported on 01/27/2022) 15 tablet 0 Not Taking  ? ? ?Scheduled:  ?Infusions:  ?PRN:  ? ?Assessment: ?8 yof with a history of Arthritis, HLD. Patient is presenting with chest pain and SOB. Heparin initially started for ACS. Request from EDP to change heparin dosing to dosing for pulmonary embolus. Patient is not on anticoagulation prior to arrival. ? ?Heparin level of 0.57 on heparin 1200 units/hr is therapeutic. Catheter directed lysis completed. No bleeding noted per RN.  ? ? ?Goal of Therapy:  ?Heparin level 0.3-0.7 units/ml during lysis per consult notes ?Monitor platelets by anticoagulation protocol:  Yes ?  ?Plan:  ?Continue heparin 1200 units/hr  ?Check 6 hr confirmatory heparin level  ? ?Gerrit Halls, PharmD, BCPS ?Clinical Pharmacist ?01/29/2022 5:16 PM ? ? ? ? ? ? ?

## 2022-01-30 DIAGNOSIS — R0602 Shortness of breath: Secondary | ICD-10-CM

## 2022-01-30 DIAGNOSIS — R0789 Other chest pain: Secondary | ICD-10-CM

## 2022-01-30 LAB — CBC WITH DIFFERENTIAL/PLATELET
Abs Immature Granulocytes: 0.13 10*3/uL — ABNORMAL HIGH (ref 0.00–0.07)
Basophils Absolute: 0 10*3/uL (ref 0.0–0.1)
Basophils Relative: 0 %
Eosinophils Absolute: 0 10*3/uL (ref 0.0–0.5)
Eosinophils Relative: 0 %
HCT: 38.6 % (ref 36.0–46.0)
Hemoglobin: 12.8 g/dL (ref 12.0–15.0)
Immature Granulocytes: 1 %
Lymphocytes Relative: 13 %
Lymphs Abs: 1.8 10*3/uL (ref 0.7–4.0)
MCH: 27.2 pg (ref 26.0–34.0)
MCHC: 33.2 g/dL (ref 30.0–36.0)
MCV: 82 fL (ref 80.0–100.0)
Monocytes Absolute: 0.6 10*3/uL (ref 0.1–1.0)
Monocytes Relative: 5 %
Neutro Abs: 10.7 10*3/uL — ABNORMAL HIGH (ref 1.7–7.7)
Neutrophils Relative %: 81 %
Platelets: 122 10*3/uL — ABNORMAL LOW (ref 150–400)
RBC: 4.71 MIL/uL (ref 3.87–5.11)
RDW: 15.4 % (ref 11.5–15.5)
WBC: 13.2 10*3/uL — ABNORMAL HIGH (ref 4.0–10.5)
nRBC: 0 % (ref 0.0–0.2)

## 2022-01-30 LAB — COMPREHENSIVE METABOLIC PANEL
ALT: 11 U/L (ref 0–44)
AST: 17 U/L (ref 15–41)
Albumin: 3.2 g/dL — ABNORMAL LOW (ref 3.5–5.0)
Alkaline Phosphatase: 46 U/L (ref 38–126)
Anion gap: 9 (ref 5–15)
BUN: 15 mg/dL (ref 6–20)
CO2: 20 mmol/L — ABNORMAL LOW (ref 22–32)
Calcium: 8.7 mg/dL — ABNORMAL LOW (ref 8.9–10.3)
Chloride: 113 mmol/L — ABNORMAL HIGH (ref 98–111)
Creatinine, Ser: 0.59 mg/dL (ref 0.44–1.00)
GFR, Estimated: >60 mL/min (ref >60)
Glucose, Bld: 99 mg/dL (ref 70–99)
Potassium: 3.3 mmol/L — ABNORMAL LOW (ref 3.5–5.1)
Sodium: 142 mmol/L (ref 135–145)
Total Bilirubin: 0.5 mg/dL (ref 0.3–1.2)
Total Protein: 5.9 g/dL — ABNORMAL LOW (ref 6.5–8.1)

## 2022-01-30 LAB — MAGNESIUM: Magnesium: 2.1 mg/dL (ref 1.7–2.4)

## 2022-01-30 LAB — HEPARIN LEVEL (UNFRACTIONATED): Heparin Unfractionated: 0.51 IU/mL (ref 0.30–0.70)

## 2022-01-30 LAB — PHOSPHORUS: Phosphorus: 2.3 mg/dL — ABNORMAL LOW (ref 2.5–4.6)

## 2022-01-30 MED ORDER — APIXABAN 5 MG PO TABS: 10.0000 mg | ORAL_TABLET | Freq: Two times a day (BID) | ORAL | Status: DC

## 2022-01-30 MED ORDER — RIVAROXABAN 15 MG PO TABS
15.0000 mg | ORAL_TABLET | Freq: Two times a day (BID) | ORAL | Status: DC
  Administered 2022-01-30 – 2022-01-31 (×2): 15 mg via ORAL
  Filled 2022-01-30 (×3): qty 1

## 2022-01-30 MED ORDER — APIXABAN 5 MG PO TABS: 5.0000 mg | ORAL_TABLET | Freq: Two times a day (BID) | ORAL | Status: DC

## 2022-01-30 MED ORDER — RIVAROXABAN 20 MG PO TABS
20.0000 mg | ORAL_TABLET | Freq: Every day | ORAL | Status: DC
Start: 2022-02-20 — End: 2022-01-31

## 2022-01-30 NOTE — Progress Notes (Signed)
E-Link notified of patients HR dropping down to the 3'0s for a few seconds and then back up. Patient is sleeping but easily aroused. Will continue to monitor and override atropine if needed.  ?

## 2022-01-30 NOTE — Progress Notes (Addendum)
ANTICOAGULATION CONSULT NOTE ?Pharmacy Consult for Xarelto ?Indication: pulmonary embolus ? ?Allergies  ?Allergen Reactions  ? Iodinated Contrast Media Anaphylaxis  ? Gluten Meal Nausea Only  ?  Makes stomach hurt also  ? Lactose Intolerance (Gi) Diarrhea and Nausea Only  ?  Makes stomach hurt also  ? Other Swelling and Rash  ?  Radioactive dyes, heavy metals like nickel  ? Wheat Bran Diarrhea and Nausea Only  ?  Makes stomach hurt also  ? Cephalexin Itching  ? ? ?Patient Measurements: ?Height: 5\' 3"  (160 cm) ?Weight: 93 kg (205 lb) ?IBW/kg (Calculated) : 52.4 ? ?Vital Signs: ?Temp: 98 ?F (36.7 ?C) (04/11 0341) ?Temp Source: Oral (04/11 0341) ?BP: 118/68 (04/11 1100) ?Pulse Rate: 76 (04/11 1100) ? ?Labs: ?Recent Labs  ?  01/27/22 ?1618 01/27/22 ?2006 01/29/22 ?[id number] 01/29/22 ?0726 01/29/22 ?1601 01/29/22 ?2235 01/30/22 ?0505 01/30/22 ?0810  ?HGB  --    < > 13.5 12.9 11.8*  --   --  12.8  ?HCT  --    < > 41.6 40.5 36.5  --   --  38.6  ?PLT  --    < > 152 149* 139*  --   --  122*  ?HEPARINUNFRC  --    < > 0.75* 0.83* 0.57 0.59 0.51  --   ?CREATININE  --   --  0.60  --   --   --   --  0.59  ?TROPONINIHS 423*  --   --   --   --   --   --   --   ? < > = values in this interval not displayed.  ? ? ? ?Estimated Creatinine Clearance: 81 mL/min (by C-G formula based on SCr of 0.59 mg/dL). ? ? ?Medical History: ?Past Medical History:  ?Diagnosis Date  ? Arthritis   ? High cholesterol   ? Kidney stones   ? Ovarian cyst   ? ? ?Medications:  ?Medications Prior to Admission  ?Medication Sig Dispense Refill Last Dose  ? carbamide peroxide (DEBROX) 6.5 % OTIC solution Place 5 drops into the right ear 2 (two) times daily. 15 mL 0 Past Week  ? meloxicam (MOBIC) 7.5 MG tablet Take 1 tablet (7.5 mg total) by mouth daily. 30 tablet 0 01/26/2022  ? ibuprofen (ADVIL,MOTRIN) 600 MG tablet Take 1 tablet (600 mg total) by mouth every 6 (six) hours as needed. (Patient not taking: Reported on 01/27/2022) 30 tablet 0 Not Taking  ? traMADol (ULTRAM)  50 MG tablet Take 1 tablet (50 mg total) by mouth every 6 (six) hours as needed. (Patient not taking: Reported on 01/27/2022) 15 tablet 0 Not Taking  ? ? ?Scheduled:  ?Infusions:  ?PRN:  ? ?Assessment: ?71 yof with a history of Arthritis, HLD. Patient is presenting with chest pain and SOB. Heparin initially started for ACS. Request from EDP to change heparin dosing to dosing for pulmonary embolus. Patient is not on anticoagulation prior to arrival. ? ?Heparin level continues to be therapeutic at 0.51 on heparin 1200 units/hr is therapeutic. Catheter directed lysis completed. CBC remains stable. No s/sx of bleeding noted. Will transition to oral anticoagulation per Dr. Sherral Hammers. Per patient, she would prefer Xarelto over Eliquis.  ? ?Goal of Therapy:  ?Monitor platelets by anticoagulation protocol: Yes ?  ?Plan:  ?DC heparin gtt ?Start Xarelto 15 mg BID with meals, then 20 mg daily with supper thereafter ?Monitor for s/sx of bleeding ? ? ?Joseph Art, Pharm.D. ?PGY-1 Pharmacy Resident ?[id number] ?01/30/2022 11:40 AM ?

## 2022-01-30 NOTE — Progress Notes (Signed)
ANTICOAGULATION CONSULT NOTE ?Pharmacy Consult for Heparin ?Indication: pulmonary embolus ?Brief A/P: Heparin level within goal range Continue Heparin at current rate  ? ?Allergies  ?Allergen Reactions  ? Iodinated Contrast Media Anaphylaxis  ? Gluten Meal Nausea Only  ?  Makes stomach hurt also  ? Lactose Intolerance (Gi) Diarrhea and Nausea Only  ?  Makes stomach hurt also  ? Other Swelling and Rash  ?  Radioactive dyes, heavy metals like nickel  ? Wheat Bran Diarrhea and Nausea Only  ?  Makes stomach hurt also  ? Cephalexin Itching  ? ? ?Patient Measurements: ?Height: 5\' 3"  (160 cm) ?Weight: 93 kg (205 lb) ?IBW/kg (Calculated) : 52.4 ?Heparin Dosing Weight: 73.7 kg ? ?Vital Signs: ?Temp: 97.9 ?F (36.6 ?C) (04/10 2258) ?Temp Source: Oral (04/10 2258) ?BP: 126/67 (04/10 1900) ?Pulse Rate: 78 (04/10 1900) ? ?Labs: ?Recent Labs  ?  01/27/22 ?0853 01/27/22 ?1031 01/27/22 ?1134 01/27/22 ?1618 01/27/22 ?2006 01/29/22 ?03/31/22 01/29/22 ?0726 01/29/22 ?1601 01/29/22 ?2235  ?HGB 13.7  --   --   --    < > 13.5 12.9 11.8*  --   ?HCT 44.3  --   --   --    < > 41.6 40.5 36.5  --   ?PLT 120*  --   --   --    < > 152 149* 139*  --   ?LABPROT  --   --  13.3  --   --   --   --   --   --   ?INR  --   --  1.0  --   --   --   --   --   --   ?HEPARINUNFRC  --   --   --   --    < > 0.75* 0.83* 0.57 0.59  ?CREATININE 0.68  --   --   --   --  0.60  --   --   --   ?TROPONINIHS 187* 278*  --  423*  --   --   --   --   --   ? < > = values in this interval not displayed.  ? ? ? ?Estimated Creatinine Clearance: 81 mL/min (by C-G formula based on SCr of 0.6 mg/dL). ? ?Assessment: ?61 y.o. female with PE for heparin ? ?Goal of Therapy:  ?Heparin level 0.3-0.7 units/ml Monitor platelets by anticoagulation protocol: Yes ?  ?Plan:  ?Continue Heparin at current rate  ? ?67, PharmD, BCPS  ? ? ? ? ? ? ?

## 2022-01-30 NOTE — Progress Notes (Signed)
?PROGRESS NOTE ? ? ? ?Carrie [name]  [id number] DOB: 03-23-1961 DOA: 01/27/2022 ?PCP: Pcp, No  ? ?Brief Narrative:  ?61 y.o. WF bilateral knee OA, obesity,  ? ?Presented with new onset of chest pain palpitation and shortness of breath. ?  ?Patient developed flulike symptoms about 7 to 10 days ago, initial symptoms of sore throat runny nose and cough, nonproductive no fever or chills.  3 days ago, her flulike symptoms started to ease away, however patient developed this pressure like chest pain on central chest, worsening with cough and certain positioning of the torso usually bending forward.  She also noticed increasing swelling of her ankles.  She does not smoke, no recent long distance travel. ?  ?ED Course: Borderline tachycardia, no hypoxia. ?  ?Chest x-ray clear.  D-dimer 11, troponins 187> 278.  Bedside echocardiogram showed concerning signs for right heart strain. ? ? ?Subjective: ?In no acute distress.  Patient refused to get off phone in order to discuss her medical condition or allow for appropriate exam.  Discussed patient with RN and pharmacy patient has no insurance. ? ? ?Assessment & Plan: ? Covid vaccination; ? ?Active Problems: ?  Acute pulmonary embolism ([place]) ?  Elevated troponin ?  Sinus tachycardia ? ? Acute pulmonary embolism ([place]) ?-4/9 s/p Placement of bilateral PA catheters and starting TPA infusion, secondary to submassive PE with RIGHT heart strain. ?-4/11 continue heparin drip.  Pharmacy researching which NOAC will be cheaper for patient to pay out-of-pocket given she has no insurance. ?-NOTE patient mother had clotting problems.  As outpatient will require hypercoagulability work-up. ?- 4/11 start NOAC today. ?- 4/11 Follow-up with [facility] on 5/11 @1420  for repeat Echocardiogram.   ? ?Chest pain ?-Likely PE given the significant elevated D-dimer and echo finding of right heart strain.  Study ordered to further investigate direct evidence of PE.  Patient however has severe IV contrast  allergy with symptoms of angioedema in the past.  And he declined offer of desensitization.  VQ scan ordered in the ED however patient is concerned about radioactivity associated with VQ scan.  Reassured patient and did a brief literature studies which were showed that radioactivity from V/Q scan less than a formal CT study.  Patient however not at all convinced and declined VQ scan.  Discussed with cardiology, for now we will continue heparin drip, check DVT study. ?-Outpatient hematology for hypercoagulable state study.  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 ?-4/11 resolved patient talking on the phone and would not discontinue conversation even though I identified myself. ?  ?Sinus tachycardia ?-Might related to PE, as per cardiology.  Cardizem CD1 120 mg daily. ?- 4/11 DC Cardizem CD1 120 mg, patient in NSR. ?-Currently  NSR if patient's SVT returns will start Cardizem SR 90 mg  BID ?-4/11 if patient remains stable overnight will DC in A.m. ? ?Elevated troponins ?-Likely secondary to right heart strain and PE.  Work-up and treatment as above. ?  ?Bilateral knee OA ?-Stable, outpatient PCP follow-up. ? ?Obese (BMI 36.3 kg/m?) ? ? ? ?Mobility Assessment (last 72 hours)   ? ? Mobility Assessment   ? ? [place] Name 01/28/22 2000 01/28/22 1556 01/27/22 1645  ?  ?  ? Does patient have an order for bedrest or is patient medically unstable Yes- Bedfast (Level 1) - Complete  while on TPA No - Continue assessment No - Continue assessment    ? What is the highest level of mobility based on  the progressive mobility assessment? Level 1 (Bedfast) - Unable to balance while sitting on edge of bed Level 3 (Stands with assist) - Balance while standing  and cannot march in place Level 3 (Stands with assist) - Balance while standing  and cannot march in place    ? Is the above level different from baseline mobility prior to current illness? No - Consider discontinuing PT/OT Yes - Recommend  PT order Yes - Recommend PT order    ? ?  ?  ? ?  ? ? ?DVT prophylaxis: Xarelto ?Code Status:  ?Family Communication:  ?Status is: Inpatient ? ? ? ?Dispo: The patient is from:  ?             Anticipated d/c is to:  ?             Anticipated d/c date is: 1 day ?             Patient currently is not medically stable to d/c. ? ? ? ? ? ?Consultants:  ?PCCM ?IR ? ? ? ?Procedures/Significant Events:  ?4/9 s/p Placement of bilateral PA catheters and starting TPA infusion, secondary to submassive PE with RIGHT heart strain. ? ? ? ?I have personally reviewed and interpreted all radiology studies and my findings are as above. ? ?VENTILATOR SETTINGS: ? ? ? ?Cultures ? ? ?Antimicrobials: ? ? ? ?Devices ?  ? ?LINES / TUBES:  ? ? ? ? ?Continuous Infusions: ? sodium chloride    ? sodium chloride Stopped (01/29/22 1301)  ? sodium chloride Stopped (01/29/22 1259)  ? heparin 1,200 Units/hr (01/30/22 1100)  ? ? ? ?Objective: ?Vitals:  ? 01/30/22 0800 01/30/22 0900 01/30/22 1000 01/30/22 1100  ?BP: (!) 153/83  135/67 118/68  ?Pulse: 87 83 78 76  ?Resp: 18 19 20  (!) 23  ?Temp:      ?TempSrc:      ?SpO2: 95% 97% 97% 95%  ?Weight:      ?Height:      ? ? ?Intake/Output Summary (Last 24 hours) at 01/30/2022 1117 ?Last data filed at 01/30/2022 1100 ?Gross per 24 hour  ?Intake 951.2 ml  ?Output 1000 ml  ?Net -48.8 ml  ? ?Filed Weights  ? 01/27/22 0829  ?Weight: 93 kg  ? ? ?Examination: ? ?General: A/O x4 No acute respiratory distress ?Eyes: negative scleral hemorrhage, negative anisocoria, negative icterus ?ENT: Negative Runny nose, negative gingival bleeding, ?Neck:  Negative scars, masses, torticollis, lymphadenopathy, JVD ?Lungs: Clear to auscultation bilaterally without wheezes or crackles ?Cardiovascular: Regular rate and rhythm without murmur gallop or rub normal S1 and S2 ?Abdomen: negative abdominal pain, nondistended, positive soft, bowel sounds, no rebound, no ascites, no appreciable mass ?Extremities: No significant cyanosis,  clubbing, or edema bilateral lower extremities ?Skin: Negative rashes, lesions, ulcers ?Psychiatric:  Negative depression, negative anxiety, negative fatigue, negative mania  ?Central nervous system:  Cranial nerves II through XII intact, tongue/uvula midline, all extremities muscle strength 5/5, sensation intact throughout, negative dysarthria, negative expressive aphasia, negative receptive aphasia. ? ?.  ? ? ? ?Data Reviewed: Care during the described time interval was provided by me .  I have reviewed this patient's available data, including medical history, events of note, physical examination, and all test results as part of my evaluation.  ? ?CBC: ?Recent Labs  ?Lab 01/28/22 ?2222 01/29/22 ?0973 01/29/22 ?0726 01/29/22 ?1601 01/30/22 ?5329  ?WBC 5.0 6.1 8.5 13.4* 13.2*  ?NEUTROABS  --   --   --   --  10.7*  ?  HGB 12.9 13.5 12.9 11.8* 12.8  ?HCT 40.9 41.6 40.5 36.5 38.6  ?MCV 83.5 82.5 82.5 82.8 82.0  ?PLT 162 152 149* 139* 122*  ? ?Basic Metabolic Panel: ?Recent Labs  ?Lab 01/27/22 ?0853 01/29/22 ?[id number] 01/30/22 ?0810  ?NA 139 140 142  ?K 4.0 3.5 3.3*  ?CL 107 114* 113*  ?CO2 21* 18* 20*  ?GLUCOSE 98 159* 99  ?BUN 16 10 15   ?CREATININE 0.68 0.60 0.59  ?CALCIUM 9.0 8.8* 8.7*  ?MG  --   --  2.1  ?PHOS  --   --  2.3*  ? ?GFR: ?Estimated Creatinine Clearance: 81 mL/min (by C-G formula based on SCr of 0.59 mg/dL). ?Liver Function Tests: ?Recent Labs  ?Lab 01/30/22 ?0810  ?AST 17  ?ALT 11  ?ALKPHOS 46  ?BILITOT 0.5  ?PROT 5.9*  ?ALBUMIN 3.2*  ? ?No results for input(s): LIPASE, AMYLASE in the last 168 hours. ?No results for input(s): AMMONIA in the last 168 hours. ?Coagulation Profile: ?Recent Labs  ?Lab 01/27/22 ?1134  ?INR 1.0  ? ?Cardiac Enzymes: ?No results for input(s): CKTOTAL, CKMB, CKMBINDEX, TROPONINI in the last 168 hours. ?BNP (last 3 results) ?No results for input(s): PROBNP in the last 8760 hours. ?HbA1C: ?No results for input(s): HGBA1C in the last 72 hours. ?CBG: ?Recent Labs  ?Lab 01/27/22 ?2139  01/28/22 ?1827 01/29/22 ?0322 01/29/22 ?1550  ?GLUCAP 120* 126* 205* 162*  ? ?Lipid Profile: ?No results for input(s): CHOL, HDL, LDLCALC, TRIG, CHOLHDL, LDLDIRECT in the last 72 hours. ?Thyroid Function Tests: ?No r

## 2022-01-30 NOTE — Progress Notes (Signed)
eLink Physician-Brief Progress Note ?Patient Name: Carrie [name] ?DOB: [birth year] ?MRN: [id number] ? ? ?Date of Service ? 01/30/2022  ?HPI/Events of Note ? HR episodically drops into 30's. HR now = 73. BP = 106/67 with MAP = 80. Sat = 98%.  ?eICU Interventions ? Plan: ?Continue to monitor HR. ?If episodes of bradycardia continue, may need to decrease Cardizem dose.   ? ? ? ?Intervention Category ?Major Interventions: Arrhythmia - evaluation and management ? ?Saladin Petrelli Cornelia Copa ?01/30/2022, 3:49 AM ?

## 2022-01-30 NOTE — Discharge Instructions (Addendum)
Information on my medicine - XARELTO? (rivaroxaban) ? ?This medication education was reviewed with me or my healthcare representative as part of my discharge preparation.  ? ?WHY WAS XARELTO? PRESCRIBED FOR YOU? ?Xarelto? was prescribed to treat blood clots that may have been found in the veins of your legs (deep vein thrombosis) or in your lungs (pulmonary embolism) and to reduce the risk of them occurring again. ? ?What do you need to know about Xarelto?? ?The starting dose is one 15 mg tablet taken TWICE daily with food for the FIRST 21 DAYS then on (enter date)  02/20/2022  the dose is changed to one 20 mg tablet taken ONCE A DAY with your evening meal. ? ?DO NOT stop taking Xarelto? without talking to the health care provider who prescribed the medication.  Refill your prescription for 20 mg tablets before you run out. ? ?After discharge, you should have regular check-up appointments with your healthcare provider that is prescribing your Xarelto?Marland Kitchen  In the future your dose may need to be changed if your kidney function changes by a significant amount. ? ?What do you do if you miss a dose? ?If you are taking Xarelto? TWICE DAILY and you miss a dose, take it as soon as you remember. You may take two 15 mg tablets (total 30 mg) at the same time then resume your regularly scheduled 15 mg twice daily the next day. ? ?If you are taking Xarelto? ONCE DAILY and you miss a dose, take it as soon as you remember on the same day then continue your regularly scheduled once daily regimen the next day. Do not take two doses of Xarelto? at the same time.  ? ?Important Safety Information ?Xarelto? is a blood thinner medicine that can cause bleeding. You should call your healthcare provider right away if you experience any of the following: ?Bleeding from an injury or your nose that does not stop. ?Unusual colored urine (red or dark brown) or unusual colored stools (red or black). ?Unusual bruising for unknown reasons. ?A serious  fall or if you hit your head (even if there is no bleeding). ? ?Some medicines may interact with Xarelto? and might increase your risk of bleeding while on Xarelto?Marland Kitchen To help avoid this, consult your healthcare provider or pharmacist prior to using any new prescription or non-prescription medications, including herbals, vitamins, non-steroidal anti-inflammatory drugs (NSAIDs) and supplements. ? ?This website has more information on Xarelto?: [email or web address].[email or web address] ? ?
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
## 2022-01-30 NOTE — Progress Notes (Signed)
? ? ?Referring Physician(s): ?Dr Harrington Challenger ? ?Supervising Physician: Aletta Edouard ? ?Patient Status:  Carrie [name] ? ?Chief Complaint: ? ?Bilat PE ?IR Procedure 4/9: Indications: Submassive PE with right heart strain ?Procedure: Placement of bilateral PA catheters and starting TPA infusion. ? ?Subjective: ? ?Pt is feeling well today ?Up in bed ?Speaking in sentences ?Breathing well  ?Reg diet ?She has no complaints ?Site at Rt neck still sore-- no hematoma ? ?Allergies: ?Iodinated contrast media, Gluten meal, Lactose intolerance (gi), Other, Wheat bran, and Cephalexin ? ?Medications: ?Prior to Admission medications   ?Medication Sig Start Date End Date Taking? Authorizing Provider  ?carbamide peroxide (DEBROX) 6.5 % OTIC solution Place 5 drops into the right ear 2 (two) times daily. 11/01/18  Yes Hayden Rasmussen, MD  ?meloxicam (MOBIC) 7.5 MG tablet Take 1 tablet (7.5 mg total) by mouth daily. 11/30/21  Yes Lamptey, Myrene Galas, MD  ?ibuprofen (ADVIL,MOTRIN) 600 MG tablet Take 1 tablet (600 mg total) by mouth every 6 (six) hours as needed. ?Patient not taking: Reported on 01/27/2022 01/23/15   Noe Gens, PA-C  ?traMADol (ULTRAM) 50 MG tablet Take 1 tablet (50 mg total) by mouth every 6 (six) hours as needed. ?Patient not taking: Reported on 01/27/2022 02/26/19   Kinnie Feil, PA-C  ? ? ? ?Vital Signs: ?BP 111/75   Pulse 60   Temp 98 ?F (36.7 ?C) (Oral)   Resp 19   Ht 5\' 3"  (1.6 m)   Wt 205 lb (93 kg)   SpO2 94%   BMI 36.31 kg/m?  ? ?Rt neck site is c/d/I ?No bleeding ?No hematoma ?Lungs CTA ?Breathing easily ? ? ?Imaging: ?CT Angio Chest Pulmonary Embolism (PE) W or WO Contrast ? ?Result Date: 01/28/2022 ?CLINICAL DATA:  Shortness of breath.  DVT. EXAM: CT ANGIOGRAPHY CHEST WITH CONTRAST TECHNIQUE: Multidetector CT imaging of the chest was performed using the standard protocol during bolus administration of intravenous contrast. Multiplanar CT image reconstructions and MIPs were obtained to evaluate the vascular  anatomy. RADIATION DOSE REDUCTION: This exam was performed according to the departmental dose-optimization program which includes automated exposure control, adjustment of the mA and/or kV according to patient size and/or use of iterative reconstruction technique. CONTRAST:  40mL OMNIPAQUE IOHEXOL 350 MG/ML SOLN COMPARISON:  09/17/2012.  Chest radiograph, 01/27/2022. FINDINGS: Cardiovascular: Bilateral pulmonary emboli. On the right, pulmonary emboli extend from the peripheral pulmonary artery into segmental branches to the right upper, middle and lower lobes, nonocclusive. On the left, pulmonary emboli are also noted extending from the peripheral main pulmonary artery into segmental branches to the upper and lower lobes. Anterior segmental branch to the left upper lobe appears occlusive. Heart is normal in size. RV LV ratio, 4.4/2.6 = 1.69. Trace pericardial fluid. Prominent main pulmonary artery measuring 3.7 cm. Normal caliber aorta. No aortic dissection or significant atherosclerosis. Mediastinum/Nodes: No neck base, mediastinal or hilar masses or enlarged lymph nodes. Trachea and esophagus are unremarkable. Lungs/Pleura: Minor atelectasis or scarring at the anterior base of the left upper lobe lingula. Lungs otherwise clear. No pleural effusion or pneumothorax. Upper Abdomen: No acute abnormality. Musculoskeletal: No fracture or acute finding.  No bone lesion. Review of the MIP images confirms the above findings. IMPRESSION: 1. Multiple bilateral pulmonary emboli. Positive for acute PE with CT evidence of right heart strain (RV/LV Ratio = 1.69) consistent with at least submassive (intermediate risk) PE. The presence of right heart strain has been associated with an increased risk of morbidity and mortality. Please refer  to the "PE Focused" order set in EPIC. 2. Lungs essentially clear with no evidence of infarction. No other acute findings. Electronically Signed   By: Lajean Manes M.D.   On: 01/28/2022 14:40   ? ?IR Angiogram Pulmonary Bilateral Selective ? ?Result Date: 01/29/2022 ?INDICATION: 61 year old with submassive pulmonary embolism. Evidence for right heart strain based on CTA and echocardiography. EXAM: 1. Placement of bilateral pulmonary artery catheters. 2. Bilateral pulmonary arteriography 3. Ultrasound guidance for vascular access x2 COMPARISON:  Chest CTA 01/28/2022 MEDICATIONS: 120 mg Solu-Medrol, 20 mg Pepcid, 1.5 mg Versed, 50 mg Benadryl ANESTHESIA/SEDATION: The patient's level of consciousness and vital signs were monitored continuously by radiology nursing throughout the procedure under my direct supervision. FLUOROSCOPY: Radiation Exposure Index (as provided by the fluoroscopic device): 64 mGy Kerma COMPLICATIONS: None immediate. TECHNIQUE: Informed written consent was obtained from the patient after a thorough discussion of the procedural risks, benefits and alternatives. All questions were addressed. A timeout was performed prior to the initiation of the procedure. Patient received Solu-Medrol, Pepcid and Benadryl due to her contrast allergy. Ultrasound confirmed a patent right internal jugular vein. Ultrasound image was saved for documentation. The right neck was prepped and draped in sterile fashion. Maximal barrier sterile technique was utilized including caps, mask, sterile gowns, sterile gloves, sterile drape, hand hygiene and skin antiseptic. Skin was anesthetized using 1% lidocaine. Using ultrasound guidance, 21 gauge needle was directed into the right internal jugular vein and micropuncture dilator set was placed. A second puncture was made in the right internal jugular vein using ultrasound guidance with a 21 gauge needle. A second micropuncture catheter was placed. Both micropuncture catheters were upsized to 6 [place] vascular sheaths over a Bentson wire. C2 catheter was advanced through the right-sided IJ sheath into the right heart and eventually into the pulmonary artery. Main  pulmonary artery pressure was obtained. The catheter was advanced into right lower lobe pulmonary arteries and selective angiography was performed for catheter placement. A 90 cm, 15 cm infusion length UniFuse catheter was placed over a Rosen wire. C2 catheter was advanced through the other internal jugular sheath. C2 catheter was easily advanced into the right heart and eventually into the left pulmonary artery. Catheter was advanced into a left lower lobe pulmonary artery and selective angiography was used to help with catheter placement. 90 cm, 10 cm infusion length UniFuse catheter was placed into the left pulmonary artery. Both sheaths were sutured in place. Bandage placed over the right neck catheters. TPA infusion was started through both catheters at the end of the procedure. FINDINGS: Main pulmonary artery pressure was 43/13, mean 25 mm Hg Bilateral pulmonary artery catheters were placed. Right catheter was advanced into a right lower lobe pulmonary artery and left catheter was advanced in the left lower lobe pulmonary artery. Placement in lower lobe pulmonary arteries was confirmed with angiography. IMPRESSION: 1. Successful placement of bilateral pulmonary artery catheters and initiation of thrombolytic therapy with tPA. 2. Pulmonary artery pressure was 43/13, mean 25 mm Hg. Electronically Signed   By: Markus Daft M.D.   On: 01/29/2022 09:46  ? ?IR Angiogram Selective Each Additional Vessel ? ?Result Date: 01/29/2022 ?INDICATION: 61 year old with submassive pulmonary embolism. Evidence for right heart strain based on CTA and echocardiography. EXAM: 1. Placement of bilateral pulmonary artery catheters. 2. Bilateral pulmonary arteriography 3. Ultrasound guidance for vascular access x2 COMPARISON:  Chest CTA 01/28/2022 MEDICATIONS: 120 mg Solu-Medrol, 20 mg Pepcid, 1.5 mg Versed, 50 mg Benadryl ANESTHESIA/SEDATION: The patient's level of consciousness  and vital signs were monitored continuously by radiology  nursing throughout the procedure under my direct supervision. FLUOROSCOPY: Radiation Exposure Index (as provided by the fluoroscopic device): 64 mGy Kerma COMPLICATIONS: None immediate. TECHNIQUE: Informed written conse

## 2022-01-31 ENCOUNTER — Other Ambulatory Visit (HOSPITAL_COMMUNITY): Payer: Self-pay

## 2022-01-31 DIAGNOSIS — E876 Hypokalemia: Secondary | ICD-10-CM

## 2022-01-31 DIAGNOSIS — E669 Obesity, unspecified: Secondary | ICD-10-CM | POA: Diagnosis present

## 2022-01-31 MED ORDER — RIVAROXABAN (XARELTO) VTE STARTER PACK (15 & 20 MG)
ORAL_TABLET | ORAL | 0 refills | Status: DC
  Filled 2022-01-31: qty 51, 30d supply, fill #0

## 2022-01-31 MED ORDER — K PHOS MONO-SOD PHOS DI & MONO 155-852-130 MG PO TABS
500.0000 mg | ORAL_TABLET | Freq: Once | ORAL | Status: AC
  Administered 2022-01-31: 500 mg via ORAL
  Filled 2022-01-31: qty 2

## 2022-01-31 NOTE — Progress Notes (Signed)
[facility] notifed that CMT called and reported 2.8 pause Bp 124/80 HR 70 patient states,she feels good ?

## 2022-01-31 NOTE — Discharge Summary (Addendum)
Physician Discharge Summary  ?Carrie [name] [id number] DOB: September 28, 1961 DOA: 01/27/2022 ? ?PCP: Pcp, No ? ?Admit date: 01/27/2022 ?Discharge date: 01/31/2022 ? ?Time spent: 35 minutes ? ?Recommendations for Outpatient Follow-up:  ? ? ?Acute pulmonary embolism ([place]) ?-4/9 s/p Placement of bilateral PA catheters and starting TPA infusion, secondary to submassive PE with RIGHT heart strain. ?-4/11 continue heparin drip.  Pharmacy researching which NOAC will be cheaper for patient to pay out-of-pocket given she has no insurance. ?-NOTE patient mother had clotting problems.  As outpatient will require hypercoagulability work-up. ?- 4/11 start NOAC today. ?- 4/11 Follow-up with [facility] on 5/11 @1420  for repeat Echocardiogram.   ?  ?Chest pain ?-Likely PE given the significant elevated D-dimer and echo finding of right heart strain.  Study ordered to further investigate direct evidence of PE.  Patient however has severe IV contrast allergy with symptoms of angioedema in the past.  And she declined offer of desensitization. ?-VQ scan ordered in the ED however patient is concerned about radioactivity associated with VQ scan.  Reassured patient and did a brief literature studies which were showed that radioactivity from V/Q scan less than a formal CT study.  Patient however not at all convinced and declined VQ scan.  Discussed with cardiology, for now we will continue heparin drip, check DVT study. ?-Outpatient hematology for hypercoagulable state study.  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 ?-4/11 resolved patient talking on the phone and would not discontinue conversation even though I identified myself. ?  ?Sinus tachycardia ?-Might related to PE, as per cardiology.  Cardizem CD1 120 mg daily. ?- 4/11 DC Cardizem CD1 120 mg, patient in NSR. ?-Currently  NSR if patient's SVT returns will start Cardizem SR 90 mg  BID ?  ?Elevated troponins ?-Likely secondary to right heart strain and  PE.  Work-up and treatment as above. ?-Patient currently asymptomatic ?  ?Bilateral knee OA ?-Stable, outpatient PCP follow-up. ?  ?Obese (BMI 36.3 kg/m?) ?-Patient may address with her PCP ? ?Hypokalemia ?- Potassium goal> 4 ?- K-Phos p.o. 500 mg prior to discharge ? ?Hypophosphatemia ?- Phosphorus goal> 2.5 ?- See hypokalemia ? ? ? ?Discharge Diagnoses:  ?Active Problems: ?  Acute pulmonary embolism ([place]) ?  Elevated troponin ?  Sinus tachycardia ?  Obese ?  Hypokalemia ?  Hypophosphatemia ? ? ?Discharge Condition: Stable ? ?Diet recommendation: Heart healthy ? ?Filed Weights  ? 01/27/22 0829  ?Weight: 93 kg  ? ? ?History of present illness:  ?61 y.o. WF bilateral knee OA, obesity,  ?  ?Presented with new onset of chest pain palpitation and shortness of breath. ?  ?Patient developed flulike symptoms about 7 to 10 days ago, initial symptoms of sore throat runny nose and cough, nonproductive no fever or chills.  3 days ago, her flulike symptoms started to ease away, however patient developed this pressure like chest pain on central chest, worsening with cough and certain positioning of the torso usually bending forward.  She also noticed increasing swelling of her ankles.  She does not smoke, no recent long distance travel. ?  ?ED Course: Borderline tachycardia, no hypoxia. ?  ?Chest x-ray clear.  D-dimer 11, troponins 187> 278.  Bedside echocardiogram showed concerning signs for right heart strain. ? ?Hospital Course:  ?See above ? ?Procedures: ?4/9 s/p Placement of bilateral PA catheters and starting TPA infusion, secondary to submassive PE with RIGHT heart strain ? ? ?Consultations: ?PCCM ?IR ? ? ?Discharge Exam: ?Vitals:  ? 01/30/22  2010 01/30/22 2331 01/31/22 0340 01/31/22 0844  ?BP: 107/66 115/68 118/72 124/71  ?Pulse: 81 74 72 79  ?Resp: 20 17 17 20   ?Temp: 98.6 ?F (37 ?C) 98.7 ?F (37.1 ?C) 98 ?F (36.7 ?C) 97.7 ?F (36.5 ?C)  ?TempSrc: Oral Oral Oral Oral  ?SpO2: 96% 97% 95% 97%  ?Weight:      ?Height:       ? ?General: A/O x4 No acute respiratory distress ?Eyes: negative scleral hemorrhage, negative anisocoria, negative icterus ?ENT: Negative Runny nose, negative gingival bleeding, ?Neck:  Negative scars, masses, torticollis, lymphadenopathy, JVD ?Lungs: Clear to auscultation bilaterally without wheezes or crackles ?Cardiovascular: Regular rate and rhythm without murmur gallop or rub normal S1 and S2 ? ? ? ?Discharge Instructions ? ? ?Allergies as of 01/31/2022   ? ?   Reactions  ? Iodinated Contrast Media Anaphylaxis  ? Gluten Meal Nausea Only  ? Makes stomach hurt also  ? Lactose Intolerance (gi) Diarrhea, Nausea Only  ? Makes stomach hurt also  ? Other Swelling, Rash  ? Radioactive dyes, heavy metals like nickel  ? Wheat Bran Diarrhea, Nausea Only  ? Makes stomach hurt also  ? Cephalexin Itching  ? ?  ? ?  ?Medication List  ?  ? ?STOP taking these medications   ? ?ibuprofen 600 MG tablet ?Commonly known as: ADVIL ?  ?meloxicam 7.5 MG tablet ?Commonly known as: Mobic ?  ?traMADol 50 MG tablet ?Commonly known as: ULTRAM ?  ? ?  ? ?TAKE these medications   ? ?carbamide peroxide 6.5 % OTIC solution ?Commonly known as: DEBROX ?Place 5 drops into the right ear 2 (two) times daily. ?  ?Rivaroxaban Stater Pack (15 mg and 20 mg) ?Commonly known as: XARELTO STARTER PACK ?Follow package directions: Take one 15mg  tablet by mouth twice a day. On day 22, switch to one 20mg  tablet once a day. Take with food. ?  ? ?  ? ?Allergies  ?Allergen Reactions  ? Iodinated Contrast Media Anaphylaxis  ? Gluten Meal Nausea Only  ?  Makes stomach hurt also  ? Lactose Intolerance (Gi) Diarrhea and Nausea Only  ?  Makes stomach hurt also  ? Other Swelling and Rash  ?  Radioactive dyes, heavy metals like nickel  ? Wheat Bran Diarrhea and Nausea Only  ?  Makes stomach hurt also  ? Cephalexin Itching  ? ? Follow-up Information   ? ? [facility] Follow up on 03/01/2022.   ?Specialty: Cardiology ?Why: @ 2:20 ?Contact  information: ?[address], [address] ?[place] [address] ?[phone] ? ?  ?  ? ?  ?  ? ?  ? ? ? ?The results of significant diagnostics from this hospitalization (including imaging, microbiology, ancillary and laboratory) are listed below for reference.   ? ?Significant Diagnostic Studies: ?CT Angio Chest Pulmonary Embolism (PE) W or WO Contrast ? ?Result Date: 01/28/2022 ?CLINICAL DATA:  Shortness of breath.  DVT. EXAM: CT ANGIOGRAPHY CHEST WITH CONTRAST TECHNIQUE: Multidetector CT imaging of the chest was performed using the standard protocol during bolus administration of intravenous contrast. Multiplanar CT image reconstructions and MIPs were obtained to evaluate the vascular anatomy. RADIATION DOSE REDUCTION: This exam was performed according to the departmental dose-optimization program which includes automated exposure control, adjustment of the mA and/or kV according to patient size and/or use of iterative reconstruction technique. CONTRAST:  52mL OMNIPAQUE IOHEXOL 350 MG/ML SOLN COMPARISON:  09/17/2012.  Chest radiograph, 01/27/2022. FINDINGS: Cardiovascular: Bilateral pulmonary emboli. On  the right, pulmonary emboli extend from the peripheral pulmonary artery into segmental branches to the right upper, middle and lower lobes, nonocclusive. On the left, pulmonary emboli are also noted extending from the peripheral main pulmonary artery into segmental branches to the upper and lower lobes. Anterior segmental branch to the left upper lobe appears occlusive. Heart is normal in size. RV LV ratio, 4.4/2.6 = 1.69. Trace pericardial fluid. Prominent main pulmonary artery measuring 3.7 cm. Normal caliber aorta. No aortic dissection or significant atherosclerosis. Mediastinum/Nodes: No neck base, mediastinal or hilar masses or enlarged lymph nodes. Trachea and esophagus are unremarkable. Lungs/Pleura: Minor atelectasis or scarring at the anterior base of the left upper lobe lingula. Lungs  otherwise clear. No pleural effusion or pneumothorax. Upper Abdomen: No acute abnormality. Musculoskeletal: No fracture or acute finding.  No bone lesion. Review of the MIP images confirms the above findings. IMPRESSION: 1.

## 2022-01-31 NOTE — TOC Transition Note (Signed)
Transition of Care (TOC) - CM/SW Discharge Note ? ? ?Patient Details  ?Name: Carrie [name] ?MRN: [id number] ?Date of Birth: Jan 16, 1961 ? ?Transition of Care (TOC) CM/SW Contact:  ?Harriet Masson, RN ?Phone Number: ?01/31/2022, 12:06 PM ? ? ?Clinical Narrative:    ?Patient is stable for discharge. New Xarelto prescription sent to [facility]. Medication assistance form given to patient. New PCP apt made but it won't be until May 16 and she has no insurance so this RNCM is trying to find a MD to sign the form.  ?Patient has transportation home.She is living with family at this time. ? ?Final next level of care: Home/Self Care ?Barriers to Discharge: Barriers Resolved ? ? ?Patient Goals and CMS Choice ?Patient states their goals for this hospitalization and ongoing recovery are:: return home ?  ?  ? ?Discharge Placement ?  ?           ? home ?  ?  ?  ? ?Discharge Plan and Services ?In-house Referral: PCP / [facility] ?Discharge Planning Services: CM Consult, Medication Assistance, Follow-up appt scheduled ?           ?  ?  ?  ?  ?  ?  ?  ?  ?  ?  ? ?Social Determinants of Health (SDOH) Interventions ?  ? ? ?Readmission Risk Interventions ? ?  01/31/2022  ? 12:01 PM  ?Readmission Risk Prevention Plan  ?Post Dischage Appt Not Complete  ?Appt Comments new PCP apt not until May 16  ?Medication Screening Complete  ?Transportation Screening Complete  ? ? ? ? ? ?

## 2022-02-06 ENCOUNTER — Telehealth (HOSPITAL_COMMUNITY): Payer: Self-pay

## 2022-02-06 NOTE — Telephone Encounter (Signed)
TOC OUTPATIENT PHARMACY FOLLOW-UP ?Call #1 attempted for TOC pharmacy follow-up.  Left VM Will follow up in 2-3 days.  ? ?

## 2022-02-10 ENCOUNTER — Telehealth (HOSPITAL_COMMUNITY): Payer: Self-pay

## 2022-02-10 ENCOUNTER — Other Ambulatory Visit (HOSPITAL_COMMUNITY): Payer: Self-pay

## 2022-02-10 NOTE — Telephone Encounter (Signed)
?  Transitions of Care Pharmacy  ? ?Call attempted for a pharmacy transitions of care follow-up.   ? ?Call attempt #2. Will follow-up in 2-3 days.  ? ?Jiles Crocker, PharmD ?Clinical Pharmacist ?[facility] ?02/10/2022 3:35 PM ? ?  ?

## 2022-02-12 ENCOUNTER — Telehealth (HOSPITAL_BASED_OUTPATIENT_CLINIC_OR_DEPARTMENT_OTHER): Payer: Self-pay

## 2022-02-12 ENCOUNTER — Other Ambulatory Visit (HOSPITAL_BASED_OUTPATIENT_CLINIC_OR_DEPARTMENT_OTHER): Payer: Self-pay

## 2022-02-12 NOTE — Telephone Encounter (Signed)
Pharmacy Transitions of Care Follow-up Telephone Call ? ?Date of discharge: 01/31/2022  ?Discharge Diagnosis: PE ? ?How have you been since you were released from the hospital? Patient is doing well, had no questions her concerns about her medication.  ? ?Medication changes made at discharge: ?START taking these medications ? ?START taking these medications  ?Xarelto Starter Pack ?Generic drug: Rivaroxaban Stater Pack (15 mg and 20 mg) ?Follow package directions: Take one 15mg  tablet by mouth twice a day. On day 22, switch to one 20mg  tablet once a day. Take with food.  ? ?CONTINUE taking these medications ? ?CONTINUE taking these medications  ?carbamide peroxide 6.5 % OTIC solution ?Commonly known as: DEBROX ?Place 5 drops into the right ear 2 (two) times daily.  ? ?STOP taking these medications ? ?STOP taking these medications  ?ibuprofen 600 MG tablet ?Commonly known as: ADVIL  ?meloxicam 7.5 MG tablet ?Commonly known as: Mobic  ?traMADol 50 MG tablet ?Commonly known as: ULTRAM  ? ? ?Medication changes verified by the patient? yes ?  ? ?Medication Accessibility: ? ?Home Pharmacy: [facility]  ? ?Was the patient provided with refills on discharged medications? no  ? ?Have all prescriptions been transferred from [facility] to home pharmacy? N/a   ? ?Medication Review: ? ?RIVAROXABAN (XARELTO)  ?Rivaroxaban 15 mg BID initiated on 01/30/2022. Will switch to 20 mg daily after 21 days (DATE 02/20/2022 should start 20 mg).   ?- Discussed importance of taking medication with food and around the same time everyday  ?- Reviewed potential DDIs with patient  ?- Advised patient of medications to avoid (NSAIDs, ASA)  ?- Educated that Tylenol (acetaminophen) will be the preferred analgesic to prevent risk of bleeding  ?- Emphasized importance of monitoring for signs and symptoms of bleeding (abnormal bruising, prolonged bleeding, nose bleeds, bleeding from gums, discolored urine, black tarry stools)  ?- Advised patient to  alert all providers of anticoagulation therapy prior to starting a new medication or having a procedure  ? ?Follow-up Appointments: ? ? Date Visit Type Length Department  ? 03/01/2022  2:20 PM OFFICE VISIT 25 min [place] Office [phone]  ?Patient Instructions:   ?Please arrive 15 minutes prior to your appointment. This will allow [place] to verify and update your medical record and ensure a full appointment for you within the time allotted.  ?      ? 03/06/2022  9:00 AM NEW PATIENT 20 min Archivist at [facility] [id number]  ?Patient Instructions:   ? ? ?If their condition worsens, is the pt aware to call PCP or go to the Emergency Dept.? yes ? ?Final Patient Assessment: ?Patient is doing well, had no questions her concerns about her medication.  ? ?Darcus Austin, PharmD ?Clinical Pharmacist ?[facility] ?02/12/2022 12:05 PM ? ?

## 2022-03-01 ENCOUNTER — Ambulatory Visit: Payer: Self-pay | Admitting: Physician Assistant

## 2022-03-06 ENCOUNTER — Ambulatory Visit: Payer: Self-pay | Admitting: Family

## 2022-03-09 ENCOUNTER — Encounter (HOSPITAL_COMMUNITY): Payer: Self-pay

## 2022-03-09 ENCOUNTER — Ambulatory Visit (HOSPITAL_COMMUNITY)
Admission: EM | Admit: 2022-03-09 | Discharge: 2022-03-09 | Disposition: A | Discharge status: Home / Self Care | Payer: Medicaid Other | Attending: Student | Admitting: Student

## 2022-03-09 DIAGNOSIS — M1712 Unilateral primary osteoarthritis, left knee: Secondary | ICD-10-CM

## 2022-03-09 DIAGNOSIS — Z760 Encounter for issue of repeat prescription: Secondary | ICD-10-CM

## 2022-03-09 DIAGNOSIS — Z86711 Personal history of pulmonary embolism: Secondary | ICD-10-CM

## 2022-03-09 MED ORDER — RIVAROXABAN 20 MG PO TABS: 20.0000 mg | ORAL_TABLET | Freq: Every day | ORAL | 1 refills | Status: DC

## 2022-03-09 NOTE — Discharge Instructions (Addendum)
-  Resume the Xarelto one pill daily.  -Try to follow-up with PCP within 2 months for further refills, or to discuss stopping this medication -If new symptoms like shortness of breath or chest pain - head to the ED -Tylenol for arthritis

## 2022-03-09 NOTE — ED Provider Notes (Signed)
[place]    CSN: [id number] Arrival date & time: 03/09/22  1226      History   Chief Complaint Chief Complaint  Patient presents with   Medication Refill    HPI Carrie [name] is a 61 y.o. female presenting for refill on the Xarelto.  History DVT, PE; was hospitalized for this 01/27/2022.  Patient states that she has been taking the Xarelto 20 mg as directed, but has been out of this for about 3 days.  She had an appointment to establish care with PCP 5/16, but did not do so due to insurance issue per patient.  States she is feeling well today, without shortness of breath, chest pain.  She states that the DVT apparently started in the right lower extremity and was unprovoked.  Chart review indicates that she will be on Xarelto for the rest of her life.  She does incidentally wish to discuss pain management for her left knee chronic osteoarthritis, she has not followed by Ortho for this in the past. Denies trauma or overuse.  HPI  Past Medical History:  Diagnosis Date   Arthritis    High cholesterol    Kidney stones    Ovarian cyst     Patient Active Problem List   Diagnosis Date Noted   Obese 01/31/2022   Hypokalemia 01/31/2022   Hypophosphatemia 01/31/2022   Elevated troponin 01/28/2022   Sinus tachycardia 01/28/2022   Acute pulmonary embolism ([place]) 01/27/2022   Pulmonary emboli ([facility]) 01/27/2022   LEUKOCYTOPENIA UNSPECIFIED 11/07/2010   DYSLEXIA 11/07/2010   HYPHEMA 11/07/2010   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/07/2010   OVARIAN CYST 11/07/2010   NONSPEC ELEVATION OF LEVELS OF TRANSAMINASE/LDH 11/07/2010   NEPHROLITHIASIS, HX OF 11/07/2010    Past Surgical History:  Procedure Laterality Date   IR ANGIOGRAM PULMONARY BILATERAL SELECTIVE  01/28/2022   IR ANGIOGRAM SELECTIVE EACH ADDITIONAL VESSEL  01/28/2022   IR INFUSION THROMBOL ARTERIAL INITIAL (MS)  01/28/2022   IR INFUSION THROMBOL ARTERIAL INITIAL (MS)  01/28/2022   IR THROMB F/U EVAL ART/VEN FINAL  DAY (MS)  01/29/2022   IR US GUIDE VASC ACCESS RIGHT  01/28/2022   IR US GUIDE VASC ACCESS RIGHT  01/28/2022   OVARIAN CYST REMOVAL      OB History   No obstetric history on file.      Home Medications    Prior to Admission medications   Medication Sig Start Date End Date Taking? Authorizing Provider  rivaroxaban (XARELTO) 20 MG TABS tablet Take 1 tablet (20 mg total) by mouth daily with supper. 03/09/22  Yes Hazel Sams, PA-C  carbamide peroxide (DEBROX) 6.5 % OTIC solution Place 5 drops into the right ear 2 (two) times daily. 11/01/18   Hayden Rasmussen, MD    Family History Family History  Problem Relation Age of Onset   Pulmonary embolism Father     Social History Social History   Tobacco Use   Smoking status: Never   Smokeless tobacco: Never  Substance Use Topics   Alcohol use: No   Drug use: No     Allergies   Iodinated contrast media, Gluten meal, Lactose intolerance (gi), Other, Wheat bran, and Cephalexin   Review of Systems Review of Systems  Respiratory:  Negative for chest tightness and shortness of breath.   All other systems reviewed and are negative.   Physical Exam Triage Vital Signs ED Triage Vitals  Enc Vitals Group     BP 03/09/22 1323 124/67  Pulse Rate 03/09/22 1323 84     Resp 03/09/22 1323 20     Temp 03/09/22 1323 97.7 F (36.5 C)     Temp Source 03/09/22 1323 Oral     SpO2 03/09/22 1323 100 %     Weight --      Height --      Head Circumference --      Peak Flow --      Pain Score 03/09/22 1410 0     Pain Loc --      Pain Edu? --      Excl. in [place]? --    No data found.  Updated Vital Signs BP 124/67 (BP Location: Left Arm)   Pulse 84   Temp 97.7 F (36.5 C) (Oral)   Resp 20   SpO2 100%   Visual Acuity Right Eye Distance:   Left Eye Distance:   Bilateral Distance:    Right Eye Near:   Left Eye Near:    Bilateral Near:     Physical Exam Vitals reviewed.  Constitutional:      General: She is not in acute  distress.    Appearance: Normal appearance. She is not ill-appearing or diaphoretic.  HENT:     Head: Normocephalic and atraumatic.  Cardiovascular:     Rate and Rhythm: Normal rate and regular rhythm.     Heart sounds: Normal heart sounds.  Pulmonary:     Effort: Pulmonary effort is normal.     Breath sounds: Normal breath sounds.  Skin:    General: Skin is warm.  Neurological:     General: No focal deficit present.     Mental Status: She is alert and oriented to person, place, and time.  Psychiatric:        Mood and Affect: Mood normal.        Behavior: Behavior normal.        Thought Content: Thought content normal.        Judgment: Judgment normal.     UC Treatments / Results  Labs (all labs ordered are listed, but only abnormal results are displayed) Labs Reviewed - No data to display  EKG   Radiology No results found.  Procedures Procedures (including critical care time)  Medications Ordered in UC Medications - No data to display  Initial Impression / Assessment and Plan / UC Course  I have reviewed the triage vital signs and the nursing notes.  Pertinent labs & imaging results that were available during my care of the patient were reviewed by me and considered in my medical decision making (see chart for details).     This patient is a very pleasant 61 y.o. year old female presenting with refill on Xarelto, which she is taking long-term following unprovoked DVT and PE. She has been out of this for 3 days per pt. Refilled today. Strongly advised establishing care with PCP. ED return precautions discussed. Patient verbalizes understanding and agreement.  .   Final Clinical Impressions(s) / UC Diagnoses   Final diagnoses:  History of pulmonary embolus (PE)  Medication refill  Primary osteoarthritis of left knee     Discharge Instructions      -Resume the Xarelto one pill daily.  -Try to follow-up with PCP within 2 months for further refills, or to  discuss stopping this medication -If new symptoms like shortness of breath or chest pain - head to the ED -Tylenol for arthritis    ED Prescriptions     Medication  Sig Dispense Auth. Provider   rivaroxaban (XARELTO) 20 MG TABS tablet Take 1 tablet (20 mg total) by mouth daily with supper. 30 tablet Hazel Sams, PA-C      PDMP not reviewed this encounter.   Hazel Sams, PA-C 03/09/22 1454

## 2022-03-09 NOTE — ED Triage Notes (Signed)
Refill on Xarelto. Pt would like medication for her knee pain.

## 2022-04-12 IMAGING — XA IR INFUSION THROMBOL ARTERIAL INITIAL (MS)
9 series · 13 of 24 positions shown · non-contrast
Comparison: Chest CTA 01/28/2022

INDICATION: 60-year-old with submassive pulmonary embolism. Evidence for right
heart strain based on CTA and echocardiography.

EXAM:
1. Placement of bilateral pulmonary artery catheters.
2. Bilateral pulmonary arteriography
3. Ultrasound guidance for vascular access x2
TECHNIQUE: Informed written consent was obtained from the patient after a
thorough discussion of the procedural risks, benefits and
alternatives. All questions were addressed. A timeout was performed
prior to the initiation of the procedure.

[Series 1: fl (-) angio · 1 of 1 slices shown (1 of 2)]
[im 1/1]
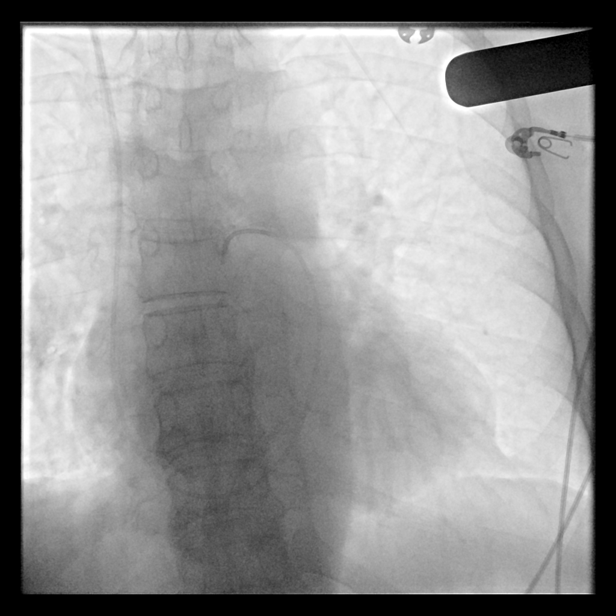

[Series 2: body 4 care · 2 acquisitions, 1 frame shown (1 of 7)]
[im 1/2]
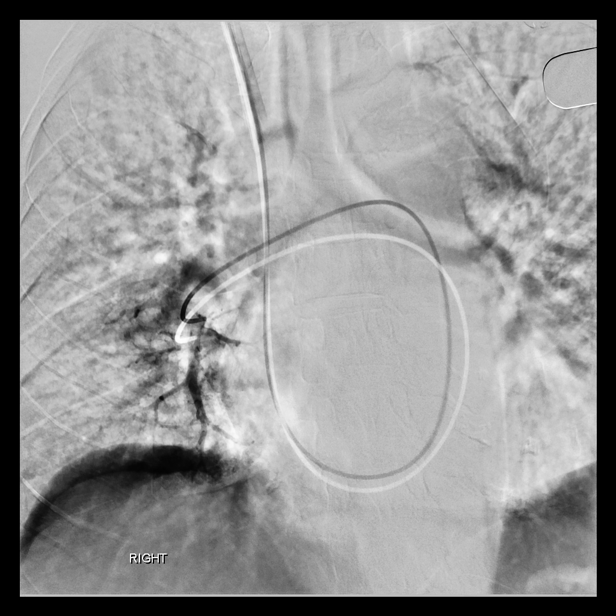

[Series 3: body 4 care · 2 acquisitions, 2 frames shown (2 of 7)]
[im 1/2]
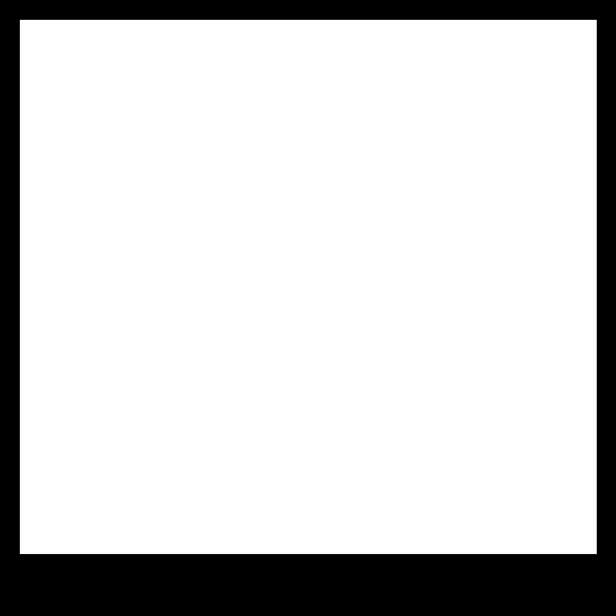
[im 1/2]
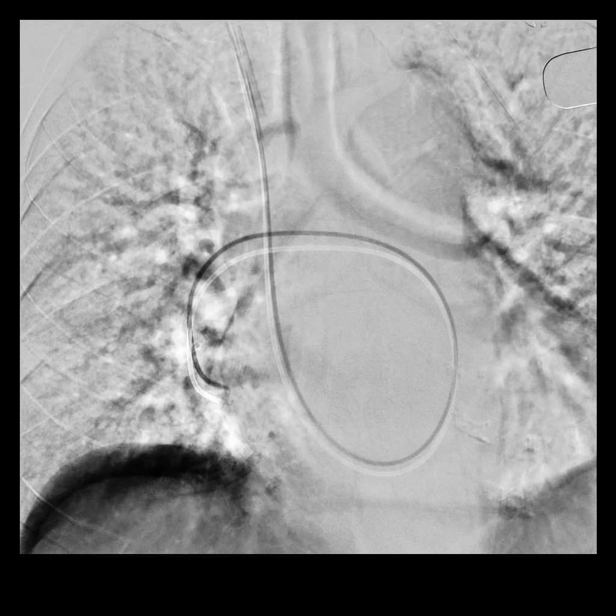

[Series 4: body 4 care · 2 acquisitions, 1 frame shown (3 of 7)]
[im 1/2]
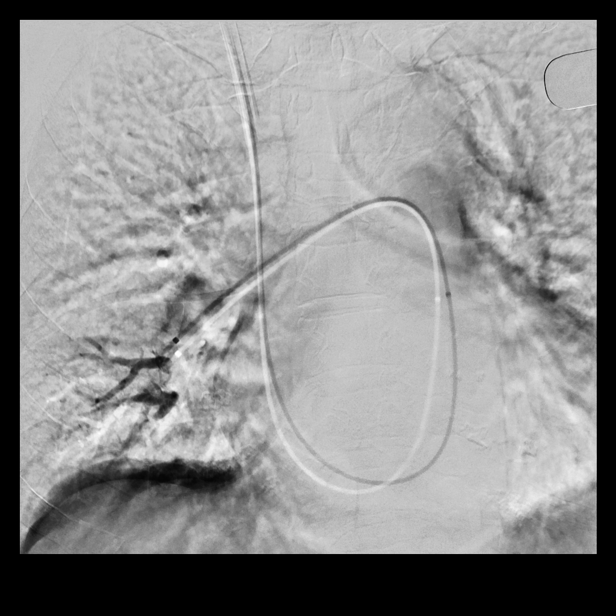

[Series 5: body 4 care · 2 acquisitions, 3 frames shown (4 of 7)]
[im 1/2]
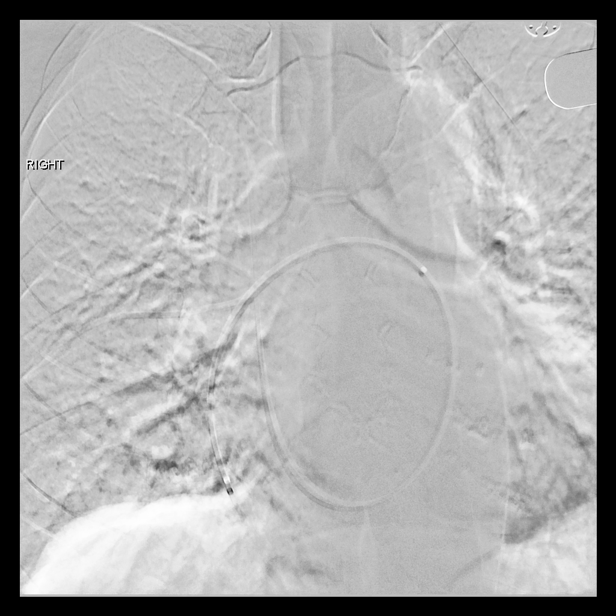
[im 1/2]
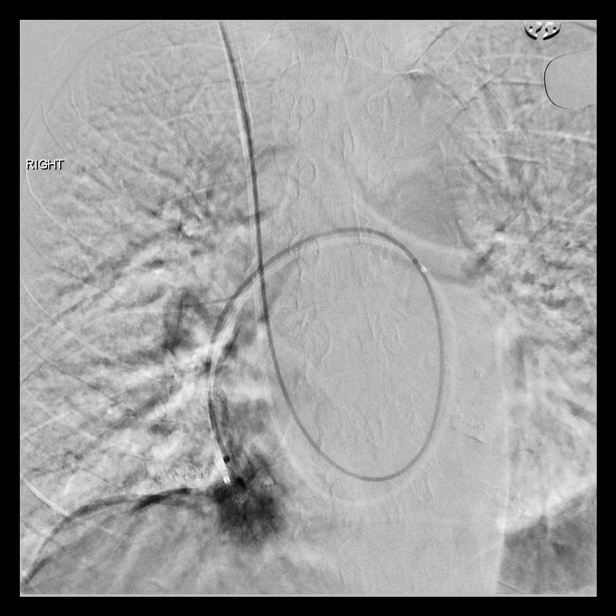
[im 2/2]
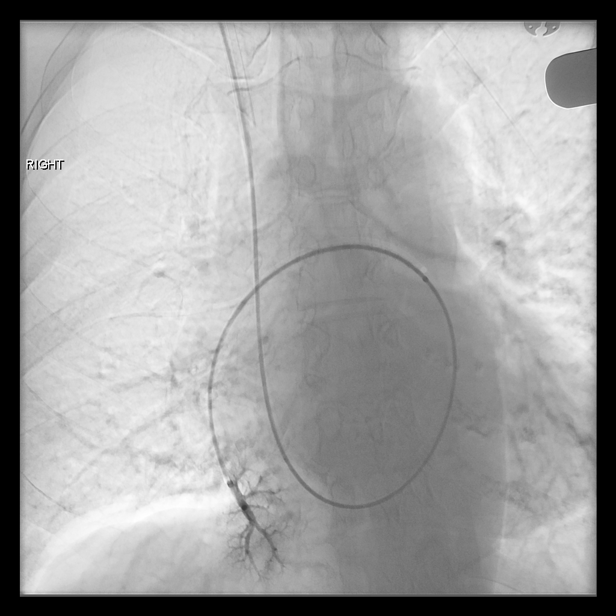

[Series 6: body 4 care · 2 acquisitions, 1 frame shown (5 of 7)]
[im 1/2]
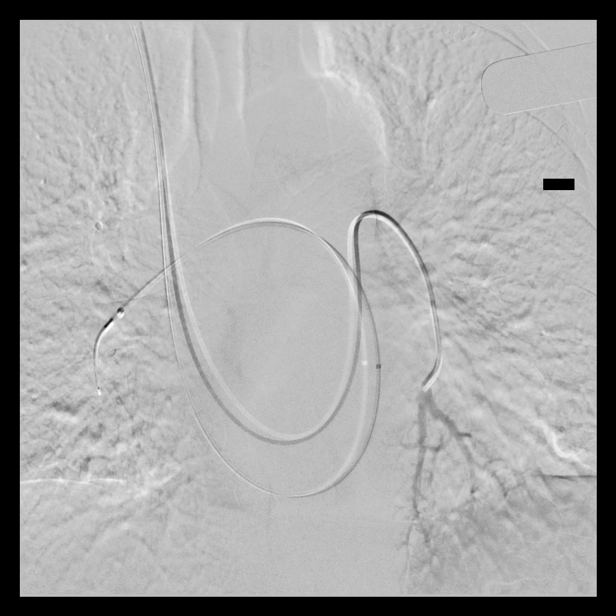

[Series 7: body 4 care · 2 acquisitions, 2 frames shown (6 of 7)]
[im 1/2]
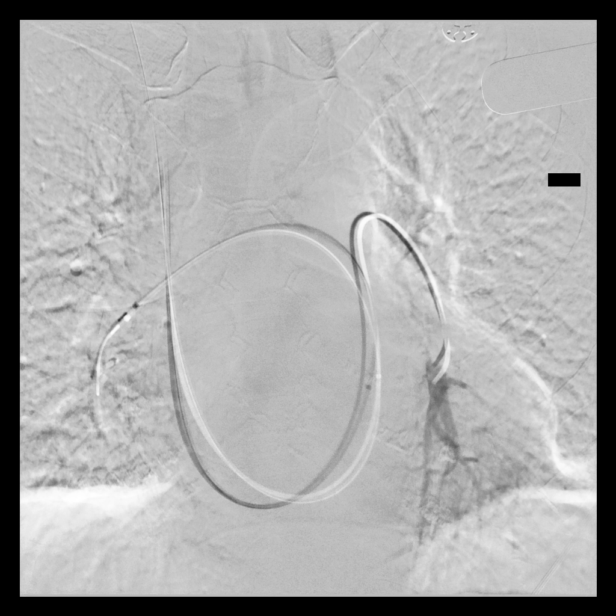
[im 2/2]
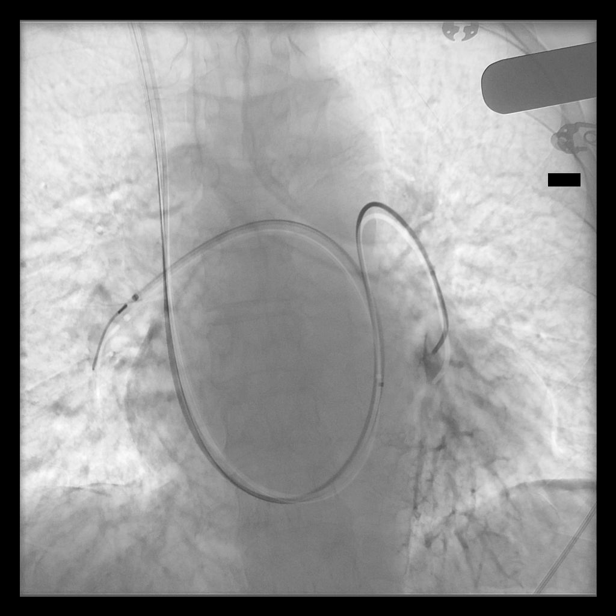

[Series 8: body 4 care · 2 acquisitions, 1 frame shown (7 of 7)]
[im 1/2]
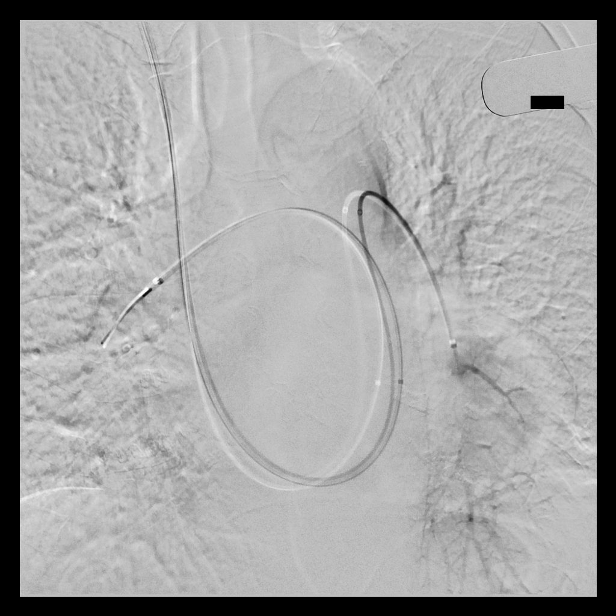

[Series 10: fl (-) angio · 1 of 1 slices shown (2 of 2)]
[im 1/1]
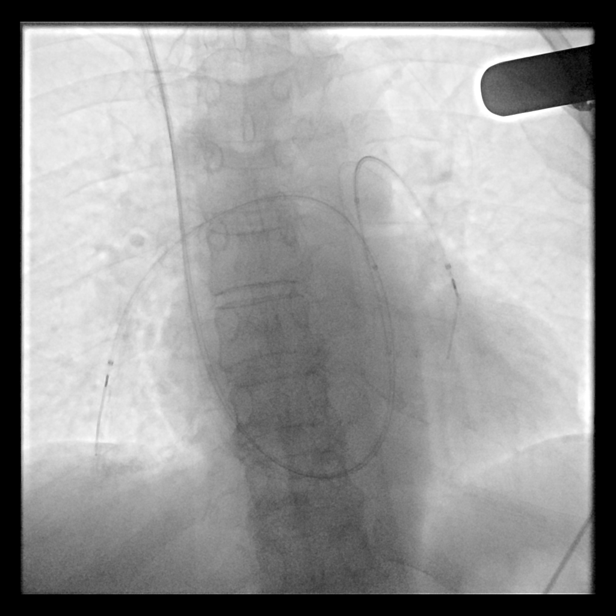

[13 of 24 positions shown; findings below may reference images not displayed]

MEDICATIONS:
120 mg Solu-Medrol, 20 mg Pepcid, 1.5 mg Versed, 50 mg Benadryl

ANESTHESIA/SEDATION:
The patient's level of consciousness and vital signs were monitored
continuously by radiology nursing throughout the procedure under my
direct supervision.

FLUOROSCOPY:
Radiation Exposure Index (as provided by the fluoroscopic device):
64 mGy Kerma

COMPLICATIONS:
None immediate.
Patient received Solu-Medrol, Pepcid and Benadryl due to her
contrast allergy. Ultrasound confirmed a patent right internal
jugular vein. Ultrasound image was saved for documentation. The
right neck was prepped and draped in sterile fashion. Maximal
barrier sterile technique was utilized including caps, mask, sterile
gowns, sterile gloves, sterile drape, hand hygiene and skin
antiseptic. Skin was anesthetized using 1% lidocaine. Using
ultrasound guidance, 21 gauge needle was directed into the right
internal jugular vein and micropuncture dilator set was placed. A
second puncture was made in the right internal jugular vein using
ultrasound guidance with a 21 gauge needle. A second micropuncture
catheter was placed. Both micropuncture catheters were upsized to 6
French vascular sheaths over a Bentson wire. C2 catheter was
advanced through the right-sided IJ sheath into the right heart and
eventually into the pulmonary artery. Main pulmonary artery pressure
was obtained. The catheter was advanced into right lower lobe
pulmonary arteries and selective angiography was performed for
catheter placement. A 90 cm, 15 cm infusion length UniFuse catheter
was placed over a Rosen wire. C2 catheter was advanced through the
other internal jugular sheath. C2 catheter was easily advanced into
the right heart and eventually into the left pulmonary artery.
Catheter was advanced into a left lower lobe pulmonary artery and
selective angiography was used to help with catheter placement. 90
cm, 10 cm infusion length UniFuse catheter was placed into the left
pulmonary artery. Both sheaths were sutured in place. Bandage placed
over the right neck catheters. TPA infusion was started through both
catheters at the end of the procedure.
FINDINGS: Main pulmonary artery pressure was 43/13, mean 25 mm Hg

Bilateral pulmonary artery catheters were placed. Right catheter was
advanced into a right lower lobe pulmonary artery and left catheter
was advanced in the left lower lobe pulmonary artery. Placement in
lower lobe pulmonary arteries was confirmed with angiography.
IMPRESSION: 1. Successful placement of bilateral pulmonary artery catheters and
initiation of thrombolytic therapy with tPA.
2. Pulmonary artery pressure was 43/13, mean 25 mm Hg.

## 2022-04-20 ENCOUNTER — Encounter (HOSPITAL_COMMUNITY): Payer: Self-pay | Admitting: Emergency Medicine

## 2022-04-20 ENCOUNTER — Ambulatory Visit (HOSPITAL_COMMUNITY)
Admission: EM | Admit: 2022-04-20 | Discharge: 2022-04-20 | Disposition: A | Discharge status: Home / Self Care | Payer: Self-pay | Attending: Student | Admitting: Student

## 2022-04-20 DIAGNOSIS — Z0189 Encounter for other specified special examinations: Secondary | ICD-10-CM

## 2022-04-20 DIAGNOSIS — R600 Localized edema: Secondary | ICD-10-CM

## 2022-04-20 LAB — COMPREHENSIVE METABOLIC PANEL
ALT: 17 U/L (ref 0–44)
AST: 17 U/L (ref 15–41)
Albumin: 3.6 g/dL (ref 3.5–5.0)
Alkaline Phosphatase: 51 U/L (ref 38–126)
Anion gap: 8 (ref 5–15)
BUN: 11 mg/dL (ref 8–23)
CO2: 23 mmol/L (ref 22–32)
Calcium: 8.8 mg/dL — ABNORMAL LOW (ref 8.9–10.3)
Chloride: 113 mmol/L — ABNORMAL HIGH (ref 98–111)
Creatinine, Ser: 0.59 mg/dL (ref 0.44–1.00)
GFR, Estimated: >60 mL/min (ref >60)
Glucose, Bld: 89 mg/dL (ref 70–99)
Potassium: 3.6 mmol/L (ref 3.5–5.1)
Sodium: 144 mmol/L (ref 135–145)
Total Bilirubin: 0.7 mg/dL (ref 0.3–1.2)
Total Protein: 6.2 g/dL — ABNORMAL LOW (ref 6.5–8.1)

## 2022-04-20 LAB — CBC WITH DIFFERENTIAL/PLATELET
Abs Immature Granulocytes: 0.01 10*3/uL (ref 0.00–0.07)
Basophils Absolute: 0 10*3/uL (ref 0.0–0.1)
Basophils Relative: 1 %
Eosinophils Absolute: 0 10*3/uL (ref 0.0–0.5)
Eosinophils Relative: 1 %
HCT: 38.5 % (ref 36.0–46.0)
Hemoglobin: 12.5 g/dL (ref 12.0–15.0)
Immature Granulocytes: 0 %
Lymphocytes Relative: 28 %
Lymphs Abs: 1.1 10*3/uL (ref 0.7–4.0)
MCH: 27.4 pg (ref 26.0–34.0)
MCHC: 32.5 g/dL (ref 30.0–36.0)
MCV: 84.2 fL (ref 80.0–100.0)
Monocytes Absolute: 0.3 10*3/uL (ref 0.1–1.0)
Monocytes Relative: 7 %
Neutro Abs: 2.4 10*3/uL (ref 1.7–7.7)
Neutrophils Relative %: 63 %
Platelets: 187 10*3/uL (ref 150–400)
RBC: 4.57 MIL/uL (ref 3.87–5.11)
RDW: 14.9 % (ref 11.5–15.5)
WBC: 3.8 10*3/uL — ABNORMAL LOW (ref 4.0–10.5)
nRBC: 0 % (ref 0.0–0.2)

## 2022-04-20 LAB — BRAIN NATRIURETIC PEPTIDE: B Natriuretic Peptide: 24.7 pg/mL (ref 0.0–100.0)

## 2022-04-20 NOTE — ED Triage Notes (Signed)
Pt c/o bilat lower leg and feet swelling that started yesterday that is worse today.

## 2022-04-20 NOTE — Discharge Instructions (Addendum)
-  We'll call with any abnormal lab results -Continue the daily anticoagulation  -Follow-up with PCP if symptoms persist, or head to the ED if they worsen over the weekend - leg swelling on one side, worsening pain, new shortness of breath, new chest pain.

## 2022-04-20 NOTE — ED Provider Notes (Signed)
[facility]    CSN: [id number] Arrival date & time: 04/20/22  1019      History   Chief Complaint Chief Complaint  Patient presents with   Leg Swelling    HPI Carrie [name] is a 61 y.o. female presenting with pedal edema. History PE 01/2022, she is taking daily xarelto as directed.  Describes approximately 24 hours of pedal edema.  Describes this as a tight feeling in the bilateral calves.  She states that she does walk about 1 mile daily, and was on her feet a little more than normal yesterday.  Has attempted elevating the legs without relief.  There is no associated shortness of breath, chest pain.  Uncomfortable but not acutely painful.  Denies trauma or falls.  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   OVARIAN CYST 11/07/2010   NONSPEC ELEVATION OF LEVELS OF TRANSAMINASE/LDH 11/07/2010   NEPHROLITHIASIS, HX OF 11/07/2010    Past Surgical History:  Procedure Laterality Date   IR ANGIOGRAM PULMONARY BILATERAL SELECTIVE  01/28/2022   IR ANGIOGRAM SELECTIVE EACH ADDITIONAL VESSEL  01/28/2022   IR INFUSION THROMBOL ARTERIAL INITIAL (MS)  01/28/2022   IR INFUSION THROMBOL ARTERIAL INITIAL (MS)  01/28/2022   IR THROMB F/U EVAL ART/VEN FINAL DAY (MS)  01/29/2022   IR US GUIDE VASC ACCESS RIGHT  01/28/2022   IR US GUIDE VASC ACCESS RIGHT  01/28/2022   OVARIAN CYST REMOVAL      OB History   No obstetric history on file.      Home Medications    Prior to Admission medications   Medication Sig  Start Date End Date Taking? Authorizing Provider  rivaroxaban (XARELTO) 20 MG TABS tablet Take 1 tablet (20 mg total) by mouth daily with supper. 03/09/22   Rhys Martini, PA-C    Family History Family History  Problem Relation Age of Onset   Pulmonary embolism Father     Social History Social History   Tobacco Use   Smoking status: Never   Smokeless tobacco: Never  Substance Use Topics   Alcohol use: No   Drug use: No     Allergies   Iodinated contrast media, Gluten meal, Lactose intolerance (gi), Other, Wheat bran, and Cephalexin   Review of Systems Review of Systems  Cardiovascular:  Positive for leg swelling.  All other systems reviewed and are negative.    Physical Exam Triage Vital Signs ED Triage Vitals  Enc Vitals Group     BP 04/20/22 1059 92/71     Pulse --      Resp 04/20/22 1059 20     Temp 04/20/22 1059 98.3 F (36.8 C)     Temp Source 04/20/22 1059 Oral     SpO2 04/20/22 1059 99 %     Weight --      Height --      Head Circumference --      Peak Flow --      Pain Score 04/20/22 1057 0  Pain Loc --      Pain Edu? --      Excl. in [place]? --    No data found.  Updated Vital Signs BP 92/71 (BP Location: Right Arm)   Temp 98.3 F (36.8 C) (Oral)   Resp 20   SpO2 99%   Visual Acuity Right Eye Distance:   Left Eye Distance:   Bilateral Distance:    Right Eye Near:   Left Eye Near:    Bilateral Near:     Physical Exam Vitals reviewed.  Constitutional:      General: She is not in acute distress.    Appearance: Normal appearance. She is not ill-appearing.  HENT:     Head: Normocephalic and atraumatic.  Cardiovascular:     Comments: Bilateral calves and ankles with 2+ pitting edema. Calves measure 50cm bilaterally. Swelling does not extend past calves. No unilateral edema or venous distension. Negative homan sign bilaterally. DP 2+, cap refill <2 seconds.  Pulmonary:     Effort: Pulmonary effort is normal.  Musculoskeletal:      Right lower leg: 2+ Edema present.     Left lower leg: 2+ Edema present.  Neurological:     General: No focal deficit present.     Mental Status: She is alert and oriented to person, place, and time.  Psychiatric:        Mood and Affect: Mood normal.        Behavior: Behavior normal.        Thought Content: Thought content normal.        Judgment: Judgment normal.      UC Treatments / Results  Labs (all labs ordered are listed, but only abnormal results are displayed) Labs Reviewed  CBC WITH DIFFERENTIAL/PLATELET  COMPREHENSIVE METABOLIC PANEL  BRAIN NATRIURETIC PEPTIDE    EKG   Radiology No results found.  Procedures Procedures (including critical care time)  Medications Ordered in UC Medications - No data to display  Initial Impression / Assessment and Plan / UC Course  I have reviewed the triage vital signs and the nursing notes.  Pertinent labs & imaging results that were available during my care of the patient were reviewed by me and considered in my medical decision making (see chart for details).     This patient is a very pleasant 61 y.o. year old female presenting with bilateral lower extremity edema. Afebrile, nontachy. History PE; she is taking daily xarelto as directed and denies any SOB or CP. No rales or adventitious breath sounds. There is no unilateral edema or venous distension. Well score for DVT -1, and this is unlikely as she is taking daily anticoagulation already. Suspect dependent edema is more likely.  Discussed option of venous Doppler; she is in agreement that DVT is unlikely, and we will defer this today.  She understands that as it is the weekend, she will have to go to the hospital if her symptoms change and she does need an ultrasound. We will check a BNP, CBC, CMP. Advised elevation, compression. ED return precautions discussed. Patient verbalizes understanding and agreement.    Final Clinical Impressions(s) / UC Diagnoses   Final  diagnoses:  Bilateral lower extremity edema  Routine lab draw     Discharge Instructions      -We'll call with any abnormal lab results -Continue the daily anticoagulation  -Follow-up with PCP if symptoms persist, or head to the ED if they worsen over the weekend - leg swelling on one side, worsening  pain, new shortness of breath, new chest pain.    ED Prescriptions   None    PDMP not reviewed this encounter.   Rhys Martini, PA-C 04/20/22 1206

## 2022-06-17 ENCOUNTER — Other Ambulatory Visit: Payer: Self-pay

## 2022-06-17 ENCOUNTER — Emergency Department (HOSPITAL_BASED_OUTPATIENT_CLINIC_OR_DEPARTMENT_OTHER): Payer: Self-pay

## 2022-06-17 ENCOUNTER — Emergency Department (HOSPITAL_COMMUNITY)
Admission: EM | Admit: 2022-06-17 | Discharge: 2022-06-18 | Disposition: A | Discharge status: Home / Self Care | Payer: Self-pay | Attending: Student | Admitting: Student

## 2022-06-17 DIAGNOSIS — R600 Localized edema: Secondary | ICD-10-CM | POA: Insufficient documentation

## 2022-06-17 DIAGNOSIS — R609 Edema, unspecified: Secondary | ICD-10-CM

## 2022-06-17 DIAGNOSIS — Z7901 Long term (current) use of anticoagulants: Secondary | ICD-10-CM | POA: Insufficient documentation

## 2022-06-17 LAB — CBC WITH DIFFERENTIAL/PLATELET
Abs Immature Granulocytes: 0.01 10*3/uL (ref 0.00–0.07)
Basophils Absolute: 0 10*3/uL (ref 0.0–0.1)
Basophils Relative: 1 %
Eosinophils Absolute: 0.1 10*3/uL (ref 0.0–0.5)
Eosinophils Relative: 2 %
HCT: 38.3 % (ref 36.0–46.0)
Hemoglobin: 12.1 g/dL (ref 12.0–15.0)
Immature Granulocytes: 0 %
Lymphocytes Relative: 29 %
Lymphs Abs: 1.4 10*3/uL (ref 0.7–4.0)
MCH: 27.3 pg (ref 26.0–34.0)
MCHC: 31.6 g/dL (ref 30.0–36.0)
MCV: 86.5 fL (ref 80.0–100.0)
Monocytes Absolute: 0.4 10*3/uL (ref 0.1–1.0)
Monocytes Relative: 9 %
Neutro Abs: 2.8 10*3/uL (ref 1.7–7.7)
Neutrophils Relative %: 59 %
Platelets: 236 10*3/uL (ref 150–400)
RBC: 4.43 MIL/uL (ref 3.87–5.11)
RDW: 14.8 % (ref 11.5–15.5)
WBC: 4.7 10*3/uL (ref 4.0–10.5)
nRBC: 0 % (ref 0.0–0.2)

## 2022-06-17 LAB — BASIC METABOLIC PANEL
Anion gap: 6 (ref 5–15)
BUN: 13 mg/dL (ref 8–23)
CO2: 25 mmol/L (ref 22–32)
Calcium: 8.7 mg/dL — ABNORMAL LOW (ref 8.9–10.3)
Chloride: 110 mmol/L (ref 98–111)
Creatinine, Ser: 0.63 mg/dL (ref 0.44–1.00)
GFR, Estimated: >60 mL/min (ref >60)
Glucose, Bld: 89 mg/dL (ref 70–99)
Potassium: 3.7 mmol/L (ref 3.5–5.1)
Sodium: 141 mmol/L (ref 135–145)

## 2022-06-17 LAB — BRAIN NATRIURETIC PEPTIDE: B Natriuretic Peptide: 12.9 pg/mL (ref 0.0–100.0)

## 2022-06-17 NOTE — ED Provider Triage Note (Signed)
Emergency Medicine Provider Triage Evaluation Note  Carrie [name] , a 62 y.o. female  was evaluated in triage.  Pt complains of bilateral lower extremity swelling beginning Friday. She states that In April she was admitted here for acute PE requiring bilateral PA catheters and tPA. She had right heart straight. She had DVT of the right lower extremity at that time. She denies history of heart failure. Denies shortness of breath  Review of Systems  Positive:  Negative: See above  Physical Exam  BP (!) 109/59 (BP Location: Right Arm)   Pulse 89   Temp 98.1 F (36.7 C) (Oral)   Resp 14   SpO2 99%  Gen:   Awake, no distress   Resp:  Normal effort  MSK:   Moves extremities without difficulty  Other:  Bilateral lower extremities with significant swelling. Not pitting edema.   Medical Decision Making  Medically screening exam initiated at 5:42 PM.  Appropriate orders placed.  [name] was informed that the remainder of the evaluation will be completed by another provider, this initial triage assessment does not replace that evaluation, and the importance of remaining in the ED until their evaluation is complete.     Cristopher Peru, PA-C 06/17/22 1746

## 2022-06-17 NOTE — Progress Notes (Signed)
VASCULAR LAB    Bilateral lower extremity venous duplex has been performed.  See CV proc for preliminary results.   Izella Ybanez, RVT 06/17/2022, 6:26 PM

## 2022-06-17 NOTE — ED Triage Notes (Signed)
Pt here for bilateral lower extremity edema w/ rash to RLE that pt noticed on Friday. PT has no hx of chf, denies cp/sob. Pt has pitting edema to bilateral feet, ankles, and legs.

## 2022-06-18 LAB — TROPONIN I (HIGH SENSITIVITY): Troponin I (High Sensitivity): 3 ng/L (ref <18)

## 2022-06-18 MED ORDER — POTASSIUM CHLORIDE CRYS ER 20 MEQ PO TBCR
40.0000 meq | EXTENDED_RELEASE_TABLET | Freq: Once | ORAL | Status: AC
  Administered 2022-06-18: 40 meq via ORAL
  Filled 2022-06-18: qty 2

## 2022-06-18 MED ORDER — POTASSIUM CHLORIDE ER 10 MEQ PO TBCR: 10.0000 meq | EXTENDED_RELEASE_TABLET | Freq: Every day | ORAL | 0 refills | Status: DC

## 2022-06-18 MED ORDER — FUROSEMIDE 20 MG PO TABS
20.0000 mg | ORAL_TABLET | Freq: Once | ORAL | Status: AC
  Administered 2022-06-18: 20 mg via ORAL
  Filled 2022-06-18: qty 1

## 2022-06-18 MED ORDER — FUROSEMIDE 20 MG PO TABS: 20.0000 mg | ORAL_TABLET | Freq: Every day | ORAL | 0 refills | Status: DC

## 2022-06-18 NOTE — ED Provider Notes (Addendum)
[facility] EMERGENCY DEPARTMENT Provider Note  CSN: [id number] Arrival date & time: 06/17/22 1501  Chief Complaint(s) Leg Swelling  HPI Carrie [name] is a 61 y.o. female with PMH HLD, arthritis, previous pulmonary embolism requiring thrombolysis with tPA secondary to right heart strain currently on Eliquis who presents to the emergency department for evaluation of lower extremity edema.  Patient states that over the last 1 week they have been doing repairs on her home and she has been unable to elevate her legs and currently does not wear compression devices because of the size of her legs.  She states that she has decreased amatory ability because of pain in her legs but is currently denying chest pain, shortness of breath, abdominal pain, nausea, vomiting or other systemic symptoms.  Patient is not on a diuretic.  Patient was seen in urgent care on 04/20/2022 with similar complaints.   Past Medical History Past Medical History:  Diagnosis Date   Arthritis    High cholesterol    Kidney stones    Ovarian cyst    Patient Active Problem List   Diagnosis Date Noted   Obese 01/31/2022   Hypokalemia 01/31/2022   Hypophosphatemia 01/31/2022   Elevated troponin 01/28/2022   Sinus tachycardia 01/28/2022   Acute pulmonary embolism (HCC) 01/27/2022   Pulmonary emboli (HCC) 01/27/2022   LEUKOCYTOPENIA UNSPECIFIED 11/07/2010   DYSLEXIA 11/07/2010   HYPHEMA 11/07/2010   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/07/2010   OVARIAN CYST 11/07/2010   NONSPEC ELEVATION OF LEVELS OF TRANSAMINASE/LDH 11/07/2010   NEPHROLITHIASIS, HX OF 11/07/2010   Home Medication(s) Prior to Admission medications   Medication Sig Start Date End Date Taking? Authorizing Provider  rivaroxaban (XARELTO) 20 MG TABS tablet Take 1 tablet (20 mg total) by mouth daily with supper. 03/09/22   Rhys Martini, PA-C                                                                                                                                     Past Surgical History Past Surgical History:  Procedure Laterality Date   IR ANGIOGRAM PULMONARY BILATERAL SELECTIVE  01/28/2022   IR ANGIOGRAM SELECTIVE EACH ADDITIONAL VESSEL  01/28/2022   IR INFUSION THROMBOL ARTERIAL INITIAL (MS)  01/28/2022   IR INFUSION THROMBOL ARTERIAL INITIAL (MS)  01/28/2022   IR THROMB F/U EVAL ART/VEN FINAL DAY (MS)  01/29/2022   IR US GUIDE VASC ACCESS RIGHT  01/28/2022   IR US GUIDE VASC ACCESS RIGHT  01/28/2022   OVARIAN CYST REMOVAL     Family History Family History  Problem Relation Age of Onset   Pulmonary embolism Father     Social History Social History   Tobacco Use   Smoking status: Never   Smokeless tobacco: Never  Substance Use Topics   Alcohol use: No   Drug use: No   Allergies Iodinated contrast media, Gluten meal, Lactose intolerance (gi), Other, Wheat bran, and  Cephalexin  Review of Systems Review of Systems  Cardiovascular:  Positive for leg swelling.    Physical Exam Vital Signs  I have reviewed the triage vital signs BP 137/79 (BP Location: Left Arm)   Pulse 77   Temp (!) 97.5 F (36.4 C) (Oral)   Resp 18   SpO2 96%   Physical Exam Vitals and nursing note reviewed.  Constitutional:      General: She is not in acute distress.    Appearance: She is well-developed.  HENT:     Head: Normocephalic and atraumatic.  Eyes:     Conjunctiva/sclera: Conjunctivae normal.  Cardiovascular:     Rate and Rhythm: Normal rate and regular rhythm.     Heart sounds: No murmur heard. Pulmonary:     Effort: Pulmonary effort is normal. No respiratory distress.     Breath sounds: Normal breath sounds.  Abdominal:     Palpations: Abdomen is soft.     Tenderness: There is no abdominal tenderness.  Musculoskeletal:        General: No swelling.     Cervical back: Neck supple.     Right lower leg: Edema present.     Left lower leg: Edema present.  Skin:    General: Skin is warm and dry.     Capillary  Refill: Capillary refill takes less than 2 seconds.  Neurological:     Mental Status: She is alert.  Psychiatric:        Mood and Affect: Mood normal.     ED Results and Treatments Labs (all labs ordered are listed, but only abnormal results are displayed) Labs Reviewed  BASIC METABOLIC PANEL - Abnormal; Notable for the following components:      Result Value   Calcium 8.7 (*)    All other components within normal limits  BRAIN NATRIURETIC PEPTIDE  CBC WITH DIFFERENTIAL/PLATELET                                                                                                                          Radiology VAS [place] LOWER EXTREMITY VENOUS (DVT) (7a-7p)  Result Date: 06/17/2022  Lower Venous DVT Study Patient Name:  Carrie [name]  Date of Exam:   06/17/2022 Medical Rec #: [id number]      Accession #:    [id number] Date of Birth: Jan 18, 1961      Patient Gender: F Patient Age:   61 years Exam Location:  [facility] Procedure:      VAS [place] LOWER EXTREMITY VENOUS (DVT) Referring Phys: Mertha Baars --------------------------------------------------------------------------------  Indications: Edema.  Risk Factors: History of PE 01/2022 with catheter directed thrombolysis. Limitations: Body habitus and edema. Comparison Study: Prior bilateral LEV done 01/27/22 indicating acute DVT in the                   right peroneal vein Performing Technologist: Sherren Kerns RVS  Examination Guidelines: A complete evaluation includes B-mode imaging, spectral Doppler, color Doppler, and power Doppler  as needed of all accessible portions of each vessel. Bilateral testing is considered an integral part of a complete examination. Limited examinations for reoccurring indications may be performed as noted. The reflux portion of the exam is performed with the patient in reverse Trendelenburg.  +---------+---------------+---------+-----------+---------------+--------------+ RIGHT     CompressibilityPhasicitySpontaneityProperties     Thrombus Aging +---------+---------------+---------+-----------+---------------+--------------+ CFV      Full                               pulsatile                                                                 waveform                      +---------+---------------+---------+-----------+---------------+--------------+ SFJ      Full                                                             +---------+---------------+---------+-----------+---------------+--------------+ FV Prox  Full                                                             +---------+---------------+---------+-----------+---------------+--------------+ FV Mid   Full                                                             +---------+---------------+---------+-----------+---------------+--------------+ FV DistalFull                                                             +---------+---------------+---------+-----------+---------------+--------------+ PFV      Full                                                             +---------+---------------+---------+-----------+---------------+--------------+ POP      Full                               pulsatile  waveform                      +---------+---------------+---------+-----------+---------------+--------------+ PTV      Full                                                             +---------+---------------+---------+-----------+---------------+--------------+ PERO                                                       patent by                                                                 color          +---------+---------------+---------+-----------+---------------+--------------+   +---------+---------------+---------+-----------+---------------+--------------+ LEFT      CompressibilityPhasicitySpontaneityProperties     Thrombus Aging +---------+---------------+---------+-----------+---------------+--------------+ CFV      Full                               pulsatile                                                                 waveform                      +---------+---------------+---------+-----------+---------------+--------------+ SFJ      Full                                                             +---------+---------------+---------+-----------+---------------+--------------+ FV Prox  Full                                                             +---------+---------------+---------+-----------+---------------+--------------+ FV Mid   Full                                                             +---------+---------------+---------+-----------+---------------+--------------+ FV DistalFull                                                             +---------+---------------+---------+-----------+---------------+--------------+  PFV      Full                                                             +---------+---------------+---------+-----------+---------------+--------------+ POP      Full                               pulsatile                                                                 waveform                      +---------+---------------+---------+-----------+---------------+--------------+ PTV                                                        patent by                                                                 color          +---------+---------------+---------+-----------+---------------+--------------+ PERO                                                       patent by                                                                 color          +---------+---------------+---------+-----------+---------------+--------------+     Summary:  BILATERAL: -No evidence of popliteal cyst, bilaterally. RIGHT: - There is no evidence of deep vein thrombosis in the lower extremity. However, portions of this examination were limited- see technologist comments above.  pulsatile waveforms  LEFT: - There is no evidence of deep vein thrombosis in the lower extremity. However, portions of this examination were limited- see technologist comments above.  Pulsatile waveforms.  *See table(s) above for measurements and observations. Electronically signed by Coral Else MD on 06/17/2022 at 8:49:59 PM.    Final     Pertinent labs & imaging results that were available during my care of the patient were reviewed by me and considered in my medical decision making (see MDM for details).  Medications Ordered in ED Medications  furosemide (LASIX) tablet 20 mg (has no administration in  time range)                                                                                                                                     Procedures Procedures  (including critical care time)  Medical Decision Making / ED Course   This patient presents to the ED for concern of lower extremity edema, this involves an extensive number of treatment options, and is a complaint that carries with it a high risk of complications and morbidity.  The differential diagnosis includes gravity dependent edema, CHF, hypoalbuminemia, renal failure, myxedema  MDM: Patient seen emergency room for bilateral lower extremity edema.  Physical exam with 1+ bilateral pitting edema in the lower extremities and a small amount of erythema over the right shin likely consistent with developing stasis dermatitis.  DVT ultrasounds negative.  Laboratory evaluation is unremarkable. Trop negative.  Low suspicion for recurrent DVT PE as a source of her lower extremity edema.  Suspect gravity dependent edema.  Compression stockings applied to the patient at bedside and she received a 1 week course of  low-dose Lasix with potassium supplementation.  Patient will need to establish primary care physician and resources were given to her on how to do this.  She was given strict return precautions of which she voiced understanding and patient then discharged.   Additional history obtained:  -External records from outside source obtained and reviewed including: Chart review including previous notes, labs, imaging, consultation notes   Lab Tests: -I ordered, reviewed, and interpreted labs.   The pertinent results include:   Labs Reviewed  BASIC METABOLIC PANEL - Abnormal; Notable for the following components:      Result Value   Calcium 8.7 (*)    All other components within normal limits  BRAIN NATRIURETIC PEPTIDE  CBC WITH DIFFERENTIAL/PLATELET      EKG   EKG Interpretation  Date/Time:  Sunday June 17 2022 17:59:28 EDT Ventricular Rate:  69 PR Interval:  160 QRS Duration: 72 QT Interval:  382 QTC Calculation: 409 R Axis:   23 Text Interpretation: Normal sinus rhythm Possible Left atrial enlargement Borderline ECG When compared with ECG of 27-Jan-2022 10:50, PREVIOUS ECG IS PRESENT Confirmed by Winnie Umali ([id number]) on 06/18/2022 7:46:58 AM         Imaging Studies ordered: I ordered imaging studies including DVT ultrasound I independently visualized and interpreted imaging. I agree with the radiologist interpretation   Medicines ordered and prescription drug management: Meds ordered this encounter  Medications   furosemide (LASIX) tablet 20 mg    -I have reviewed the patients home medicines and have made adjustments as needed  Critical interventions none   Social Determinants of Health:  Factors impacting patients care include: No current PCP, deviating from normal elevating behaviors secondary to home renovations   Reevaluation: After the interventions noted above, I reevaluated the patient and found that they have :improved  Co morbidities  that complicate  the patient evaluation  Past Medical History:  Diagnosis Date   Arthritis    High cholesterol    Kidney stones    Ovarian cyst       Dispostion: I considered admission for this patient, but she currently does not meet inpatient criteria for admission is safe for discharge with outpatient follow-up     Final Clinical Impression(s) / ED Diagnoses Final diagnoses:  None     @PCDICTATION @    , MD 06/18/22 1109    06/20/22, MD 06/18/22 1109

## 2022-06-18 NOTE — ED Notes (Signed)
Discharge instructions reviewed with patient. Follow-up care reviewed. Patient  verbalized understanding. Patient A&Ox4, VSS, and ambulatory with steady gait upon discharge.  

## 2022-11-06 ENCOUNTER — Ambulatory Visit (HOSPITAL_COMMUNITY)
Admission: EM | Admit: 2022-11-06 | Discharge: 2022-11-06 | Disposition: A | Discharge status: Home / Self Care | Payer: Medicaid Other | Attending: Emergency Medicine | Admitting: Emergency Medicine

## 2022-11-06 ENCOUNTER — Ambulatory Visit (INDEPENDENT_AMBULATORY_CARE_PROVIDER_SITE_OTHER): Payer: Medicaid Other

## 2022-11-06 ENCOUNTER — Encounter (HOSPITAL_COMMUNITY): Payer: Self-pay | Admitting: Emergency Medicine

## 2022-11-06 DIAGNOSIS — R0602 Shortness of breath: Secondary | ICD-10-CM | POA: Diagnosis present

## 2022-11-06 DIAGNOSIS — Z1152 Encounter for screening for COVID-19: Secondary | ICD-10-CM | POA: Insufficient documentation

## 2022-11-06 DIAGNOSIS — R059 Cough, unspecified: Secondary | ICD-10-CM | POA: Diagnosis not present

## 2022-11-06 DIAGNOSIS — R062 Wheezing: Secondary | ICD-10-CM

## 2022-11-06 DIAGNOSIS — B349 Viral infection, unspecified: Secondary | ICD-10-CM

## 2022-11-06 MED ORDER — PAXLOVID (300/100) 20 X 150 MG & 10 X 100MG PO TBPK: 3.0000 | ORAL_TABLET | Freq: Two times a day (BID) | ORAL | 0 refills | Status: DC

## 2022-11-06 MED ORDER — ALBUTEROL SULFATE HFA 108 (90 BASE) MCG/ACT IN AERS: 2.0000 | INHALATION_SPRAY | Freq: Four times a day (QID) | RESPIRATORY_TRACT | 0 refills | Status: DC | PRN

## 2022-11-06 MED ORDER — ALBUTEROL SULFATE (2.5 MG/3ML) 0.083% IN NEBU
INHALATION_SOLUTION | RESPIRATORY_TRACT | Status: AC
  Filled 2022-11-06: qty 3

## 2022-11-06 MED ORDER — PREDNISONE 20 MG PO TABS: 40.0000 mg | ORAL_TABLET | Freq: Every day | ORAL | 0 refills | Status: DC

## 2022-11-06 MED ORDER — MOLNUPIRAVIR EUA 200MG CAPSULE
4.0000 | ORAL_CAPSULE | Freq: Two times a day (BID) | ORAL | 0 refills | Status: AC
Start: 2022-11-06 — End: 2022-11-11

## 2022-11-06 MED ORDER — ALBUTEROL SULFATE (2.5 MG/3ML) 0.083% IN NEBU
2.5000 mg | INHALATION_SOLUTION | Freq: Once | RESPIRATORY_TRACT | Status: AC
  Administered 2022-11-06: 2.5 mg via RESPIRATORY_TRACT

## 2022-11-06 NOTE — ED Provider Notes (Signed)
[facility]    CSN: [id number] Arrival date & time: 11/06/22  1358      History   Chief Complaint Chief Complaint  Patient presents with   Headache    HPI Carrie [name] is a 62 y.o. female.  Patient presents complaining of shortness of breath upon exertion, headache, loss of appetite, nonproductive cough, and nasal congestion that started 2 days ago.  Patient reports that she was exposed to her sister-in-law who tested positive for COVID.  Patient reports that her symptoms have progressively worsened even after use of homeopathic remedies.  She reports a history of pulmonary embolism, she states that she is no longer taking any blood thinners and has been cleared by her provider.   She is concerned due to the worsening shortness of breath.  She reports that she had an episode last night where she had a " twinge" on the left side where her ribs are located.  She denies any chest tightness or pressure. She denies any history of pneumonia, denies history of asthma  and denies history of cardiac problems.    Headache Associated symptoms: congestion, cough, diarrhea, fatigue, nausea and sore throat   Associated symptoms: no abdominal pain, no drainage, no ear pain, no fever, no sinus pressure and no vomiting     Past Medical History:  Diagnosis Date   Arthritis    High cholesterol    Kidney stones    Ovarian cyst     Patient Active Problem List   Diagnosis Date Noted   Obese 01/31/2022   Hypokalemia 01/31/2022   Hypophosphatemia 01/31/2022   Elevated troponin 01/28/2022   Sinus tachycardia 01/28/2022   Acute pulmonary embolism ([place]) 01/27/2022   Pulmonary emboli ([facility]) 01/27/2022   LEUKOCYTOPENIA UNSPECIFIED 11/07/2010   DYSLEXIA 11/07/2010   HYPHEMA 11/07/2010   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/07/2010   OVARIAN CYST 11/07/2010   NONSPEC ELEVATION OF LEVELS OF TRANSAMINASE/LDH 11/07/2010   NEPHROLITHIASIS, HX OF 11/07/2010    Past Surgical History:   Procedure Laterality Date   IR ANGIOGRAM PULMONARY BILATERAL SELECTIVE  01/28/2022   IR ANGIOGRAM SELECTIVE EACH ADDITIONAL VESSEL  01/28/2022   IR INFUSION THROMBOL ARTERIAL INITIAL (MS)  01/28/2022   IR INFUSION THROMBOL ARTERIAL INITIAL (MS)  01/28/2022   IR THROMB F/U EVAL ART/VEN FINAL DAY (MS)  01/29/2022   IR US GUIDE VASC ACCESS RIGHT  01/28/2022   IR US GUIDE VASC ACCESS RIGHT  01/28/2022   OVARIAN CYST REMOVAL      OB History   No obstetric history on file.      Home Medications    Prior to Admission medications   Medication Sig Start Date End Date Taking? Authorizing Provider  albuterol (VENTOLIN HFA) 108 (90 Base) MCG/ACT inhaler Inhale 2 puffs into the lungs every 6 (six) hours as needed for wheezing or shortness of breath. 11/06/22  Yes Flossie Dibble, NP  aspirin EC 81 MG tablet Take 81 mg by mouth daily. Swallow whole.   Yes [provider]  nirmatrelvir & ritonavir (PAXLOVID, 300/100,) 20 x 150 MG & 10 x 100MG  TBPK Take 3 tablets by mouth 2 (two) times daily for 5 days. 11/06/22 11/11/22 Yes Flossie Dibble, NP  predniSONE (DELTASONE) 20 MG tablet Take 2 tablets (40 mg total) by mouth daily. 11/06/22  Yes Flossie Dibble, NP    Family History Family History  Problem Relation Age of Onset   Pulmonary embolism Father     Social History Social History  Tobacco Use   Smoking status: Never   Smokeless tobacco: Never  Substance Use Topics   Alcohol use: No   Drug use: No     Allergies   Iodinated contrast media, Gluten meal, Lactose intolerance (gi), Other, Wheat bran, and Cephalexin   Review of Systems Review of Systems  Constitutional:  Positive for activity change, appetite change, chills and fatigue. Negative for fever.  HENT:  Positive for congestion, rhinorrhea and sore throat. Negative for ear discharge, ear pain, postnasal drip, sinus pressure, sinus pain and trouble swallowing.   Eyes: Negative.   Respiratory:  Positive for cough,  shortness of breath and wheezing. Negative for chest tightness and stridor.   Cardiovascular:  Negative for chest pain and palpitations.  Gastrointestinal:  Positive for diarrhea and nausea. Negative for abdominal pain, constipation and vomiting.  Neurological:  Positive for headaches.     Physical Exam Triage Vital Signs ED Triage Vitals [11/06/22 1416]  Enc Vitals Group     BP 113/77     Pulse Rate 97     Resp (!) 22     Temp 98 F (36.7 C)     Temp Source Oral     SpO2 96 %     Weight      Height      Head Circumference      Peak Flow      Pain Score 6     Pain Loc      Pain Edu?      Excl. in [place]?    No data found.  Updated Vital Signs BP 113/77 (BP Location: Left Arm)   Pulse 97   Temp 98 F (36.7 C) (Oral)   Resp (!) 22   SpO2 96%     Physical Exam Vitals and nursing note reviewed.  Constitutional:      Appearance: She is well-developed.  HENT:     Right Ear: Hearing, tympanic membrane, ear canal and external ear normal.     Left Ear: Hearing, tympanic membrane, ear canal and external ear normal.     Nose: Rhinorrhea present. No congestion. Rhinorrhea is clear.     Right Turbinates: Not enlarged or swollen.     Left Turbinates: Not enlarged or swollen.     Mouth/Throat:     Mouth: Mucous membranes are moist.     Pharynx: Oropharynx is clear. Uvula midline. No pharyngeal swelling, oropharyngeal exudate, posterior oropharyngeal erythema or uvula swelling.     Tonsils: No tonsillar exudate or tonsillar abscesses. 0 on the right. 0 on the left.  Cardiovascular:     Rate and Rhythm: Normal rate and regular rhythm.     Heart sounds: Normal heart sounds.  Pulmonary:     Effort: Tachypnea present.     Breath sounds: Examination of the right-upper field reveals wheezing. Examination of the left-upper field reveals wheezing. Examination of the right-middle field reveals wheezing. Examination of the left-middle field reveals wheezing. Examination of the right-lower  field reveals wheezing and rhonchi. Examination of the left-lower field reveals wheezing. Wheezing and rhonchi present.  Neurological:     Mental Status: She is alert.      UC Treatments / Results  Labs (all labs ordered are listed, but only abnormal results are displayed) Labs Reviewed  SARS CORONAVIRUS 2 (TAT 6-24 HRS)    EKG   Radiology DG Chest 2 View  Result Date: 11/06/2022 CLINICAL DATA:  Cough with abnormal lung sounds. EXAM: CHEST - 2 VIEW COMPARISON:  01/27/2022  FINDINGS: The lungs are clear without focal pneumonia, edema, pneumothorax or pleural effusion. The cardiopericardial silhouette is within normal limits for size. The visualized bony structures of the thorax are unremarkable. IMPRESSION: No active cardiopulmonary disease. Electronically Signed   By: Kennith Center M.D.   On: 11/06/2022 15:36    Procedures Procedures (including critical care time)  Medications Ordered in UC Medications  albuterol (PROVENTIL) (2.5 MG/3ML) 0.083% nebulizer solution 2.5 mg (2.5 mg Nebulization Given 11/06/22 1553)    Initial Impression / Assessment and Plan / UC Course  I have reviewed the triage vital signs and the nursing notes.  Pertinent labs & imaging results that were available during my care of the patient were reviewed by me and considered in my medical decision making (see chart for details).    Patient was evaluated for viral illness, shortness of breath, and wheezing.  High suspicion for COVID-19.  Chest x-ray ordered showed no cardiopulmonary disease.  Breathing treatment in office provided the patient with some relief of symptoms.  Symptoms seem to be related to an inflammatory response from an initial viral illness (high likelihood of COVID).  COVID test is pending.  Albuterol inhaler, prednisone, and Paxlovid was sent to the pharmacy.  Patient was made aware of treatment regiment.  Last BMP per epic showed GFR greater than 60 and creatinine of 0.63.  Patient was made  aware of possible side effects of medication regiment.  Patient was made aware of red flag symptoms that warrant an emergency department visit.  Work note was given.  Patient verbalized understanding of instructions.  Charting was provided using a a verbal dictation system, charting was proofread for errors, errors may occur which could change the meaning of the information charted.   Final Clinical Impressions(s) / UC Diagnoses   Final diagnoses:  Viral illness  Shortness of breath  Wheezing     Discharge Instructions      We will call you if any of your test results are positive, you may view your test results on MyChart.   Albuterol inhaler has been sent to the pharmacy, you use this every 6 hours as needed for shortness of breath.  Prednisone has been sent to the pharmacy, you will take 2 tablets in the morning for the next 5 days.  Paxlovid has been sent to the pharmacy, this is an antiviral medication for COVID, you will take 3 tablets by mouth in the morning and in the evening, this medication can cause diarrhea and upset stomach, if the medication begins to become intolerable please discontinue.  If you begin having chest pain, worsening shortness of breath, worsening lightheadedness, or any new/concerning symptoms at home please go to the nearest emergency department for further evaluation.      ED Prescriptions     Medication Sig Dispense Auth. Provider   albuterol (VENTOLIN HFA) 108 (90 Base) MCG/ACT inhaler Inhale 2 puffs into the lungs every 6 (six) hours as needed for wheezing or shortness of breath. 6.7 g Debby Freiberg, NP   predniSONE (DELTASONE) 20 MG tablet Take 2 tablets (40 mg total) by mouth daily. 10 tablet Debby Freiberg, NP   nirmatrelvir & ritonavir (PAXLOVID, 300/100,) 20 x 150 MG & 10 x 100MG  TBPK Take 3 tablets by mouth 2 (two) times daily for 5 days. 30 tablet , NP      PDMP not reviewed this encounter.   Debby Freiberg, NP 11/06/22 1713

## 2022-11-06 NOTE — ED Triage Notes (Signed)
Cough, nasal congestion, headache, sore throat, body aches, nausea, loss of appetite, feels SOB w activities x 3 days. Recently exposed to sister-in-law who had Covid. Trying home remedies without improvement.

## 2022-11-06 NOTE — Discharge Instructions (Addendum)
We will call you if any of your test results are positive, you may view your test results on MyChart.   Albuterol inhaler has been sent to the pharmacy, you use this every 6 hours as needed for shortness of breath.  Prednisone has been sent to the pharmacy, you will take 2 tablets in the morning for the next 5 days.  Molnupiravir, this is an antiviral medication for COVID.   If you begin having chest pain, worsening shortness of breath, worsening lightheadedness, or any new/concerning symptoms at home please go to the nearest emergency department for further evaluation.

## 2022-11-07 LAB — SARS CORONAVIRUS 2 (TAT 6-24 HRS): SARS Coronavirus 2: NEGATIVE

## 2022-11-12 ENCOUNTER — Ambulatory Visit (HOSPITAL_COMMUNITY)
Admission: EM | Admit: 2022-11-12 | Discharge: 2022-11-12 | Disposition: A | Discharge status: Home / Self Care | Payer: Medicaid Other | Attending: Family Medicine | Admitting: Family Medicine

## 2022-11-12 ENCOUNTER — Encounter (HOSPITAL_COMMUNITY): Payer: Self-pay | Admitting: Emergency Medicine

## 2022-11-12 ENCOUNTER — Other Ambulatory Visit: Payer: Self-pay

## 2022-11-12 DIAGNOSIS — J4521 Mild intermittent asthma with (acute) exacerbation: Secondary | ICD-10-CM | POA: Diagnosis not present

## 2022-11-12 DIAGNOSIS — J0110 Acute frontal sinusitis, unspecified: Secondary | ICD-10-CM | POA: Diagnosis not present

## 2022-11-12 MED ORDER — ALBUTEROL SULFATE (2.5 MG/3ML) 0.083% IN NEBU: 2.5000 mg | INHALATION_SOLUTION | RESPIRATORY_TRACT | 0 refills | Status: DC | PRN

## 2022-11-12 MED ORDER — PREDNISONE 20 MG PO TABS: ORAL_TABLET | ORAL | 0 refills | Status: DC

## 2022-11-12 MED ORDER — IPRATROPIUM-ALBUTEROL 0.5-2.5 (3) MG/3ML IN SOLN
3.0000 mL | Freq: Once | RESPIRATORY_TRACT | Status: AC
  Administered 2022-11-12: 3 mL via RESPIRATORY_TRACT

## 2022-11-12 MED ORDER — IPRATROPIUM-ALBUTEROL 0.5-2.5 (3) MG/3ML IN SOLN
RESPIRATORY_TRACT | Status: AC
  Filled 2022-11-12: qty 3

## 2022-11-12 MED ORDER — AMOXICILLIN-POT CLAVULANATE 875-125 MG PO TABS: 1.0000 | ORAL_TABLET | Freq: Two times a day (BID) | ORAL | 0 refills | Status: AC

## 2022-11-12 NOTE — ED Triage Notes (Signed)
Symptoms started 11/03/2022 Patient was seen in ucc on 11/06/2022 Patient was tested for covid at that time , tested positive.  Medicines were called in.  Patient picked up medicines. Patient feels symptoms worsened over weekend.  She added turmeric as she learned from internet and felt it did help.  Patient is here today for throbbing to left eye and headache, intermittent sharp pain.  And complains that phlegm in chest is not break up .  Has tried mucinex .

## 2022-11-12 NOTE — ED Provider Notes (Signed)
[facility]    CSN: [id number] Arrival date & time: 11/12/22  1800      History   Chief Complaint Chief Complaint  Patient presents with   Cough    HPI Carrie [name] is a 62 y.o. female.    Cough  Here for worsening chest congestion and a headache over her left eye.  About January 13 she began having cough and cold symptoms.  She has not ever had any fever.  She was seen here on January 16 and since she has been exposed to COVID, the patient was treated with Paxlovid.  The COVID PCR test however was negative.  She also was treated with an albuterol inhaler and prednisone, due to the fact that she was wheezing on exam.  Today patient states she has never had asthma.  She does admit that the albuterol inhaler "helps break it up" a little.  She means the chest congestion.  She also has a sharp pain on her left frontal area and behind her left eye.   Chest x-ray was clear when she was seen January 16, and she does not want to repeat the x-ray due to reducing her radiation exposure.  Past Medical History:  Diagnosis Date   Arthritis    High cholesterol    Kidney stones    Ovarian cyst     Patient Active Problem List   Diagnosis Date Noted   Obese 01/31/2022   Hypokalemia 01/31/2022   Hypophosphatemia 01/31/2022   Elevated troponin 01/28/2022   Sinus tachycardia 01/28/2022   Acute pulmonary embolism ([facility]) 01/27/2022   Pulmonary emboli ([facility]) 01/27/2022   LEUKOCYTOPENIA UNSPECIFIED 11/07/2010   DYSLEXIA 11/07/2010   HYPHEMA 11/07/2010   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/07/2010   OVARIAN CYST 11/07/2010   NONSPEC ELEVATION OF LEVELS OF TRANSAMINASE/LDH 11/07/2010   NEPHROLITHIASIS, HX OF 11/07/2010    Past Surgical History:  Procedure Laterality Date   IR ANGIOGRAM PULMONARY BILATERAL SELECTIVE  01/28/2022   IR ANGIOGRAM SELECTIVE EACH ADDITIONAL VESSEL  01/28/2022   IR INFUSION THROMBOL ARTERIAL INITIAL (MS)  01/28/2022   IR INFUSION THROMBOL ARTERIAL  INITIAL (MS)  01/28/2022   IR THROMB F/U EVAL ART/VEN FINAL DAY (MS)  01/29/2022   IR US GUIDE VASC ACCESS RIGHT  01/28/2022   IR US GUIDE VASC ACCESS RIGHT  01/28/2022   OVARIAN CYST REMOVAL      OB History   No obstetric history on file.      Home Medications    Prior to Admission medications   Medication Sig Start Date End Date Taking? Authorizing Provider  albuterol (PROVENTIL) (2.5 MG/3ML) 0.083% nebulizer solution Take 3 mLs (2.5 mg total) by nebulization every 4 (four) hours as needed for wheezing or shortness of breath. 11/12/22  Yes Ashna Dorough, Janace Aris, MD  amoxicillin-clavulanate (AUGMENTIN) 875-125 MG tablet Take 1 tablet by mouth 2 (two) times daily for 7 days. 11/12/22 11/19/22 Yes Zenia Resides, MD  predniSONE (DELTASONE) 20 MG tablet 3 tabs daily x3 days, then 2 tabs daily x3 days, then 1 tab daily x3 days, then one half tab daily x3 days, then stop 11/12/22  Yes Dabria Wadas, Janace Aris, MD  albuterol (VENTOLIN HFA) 108 (90 Base) MCG/ACT inhaler Inhale 2 puffs into the lungs every 6 (six) hours as needed for wheezing or shortness of breath. 11/06/22   Debby Freiberg, NP  aspirin EC 81 MG tablet Take 81 mg by mouth daily. Swallow whole.    [provider]    [facility]  History Family History  Problem Relation Age of Onset   Pulmonary embolism Father     Social History Social History   Tobacco Use   Smoking status: Never   Smokeless tobacco: Never  Vaping Use   Vaping Use: Never used  Substance Use Topics   Alcohol use: No   Drug use: No     Allergies   Iodinated contrast media, Gluten meal, Lactose intolerance (gi), Other, Wheat bran, and Cephalexin   Review of Systems Review of Systems  Respiratory:  Positive for cough.      Physical Exam Triage Vital Signs ED Triage Vitals  Enc Vitals Group     BP 11/12/22 1929 (!) 146/82     Pulse Rate 11/12/22 1929 99     Resp 11/12/22 1929 20     Temp 11/12/22 1929 98.6 F (37 C)     Temp Source 11/12/22  1929 Oral     SpO2 11/12/22 1929 96 %     Weight --      Height --      Head Circumference --      Peak Flow --      Pain Score 11/12/22 1926 8     Pain Loc --      Pain Edu? --      Excl. in [place]? --    No data found.  Updated Vital Signs BP (!) 146/82 (BP Location: Left Arm)   Pulse 99   Temp 98.6 F (37 C) (Oral)   Resp 20   SpO2 96%   Visual Acuity Right Eye Distance:   Left Eye Distance:   Bilateral Distance:    Right Eye Near:   Left Eye Near:    Bilateral Near:     Physical Exam Vitals reviewed.  Constitutional:      General: She is not in acute distress.    Appearance: She is not toxic-appearing.  HENT:     Nose: Nose normal.     Mouth/Throat:     Mouth: Mucous membranes are moist.     Pharynx: No oropharyngeal exudate or posterior oropharyngeal erythema.  Eyes:     Extraocular Movements: Extraocular movements intact.     Conjunctiva/sclera: Conjunctivae normal.     Pupils: Pupils are equal, round, and reactive to light.  Cardiovascular:     Rate and Rhythm: Normal rate and regular rhythm.     Heart sounds: No murmur heard. Pulmonary:     Effort: No respiratory distress.     Breath sounds: No stridor.     Comments: There is bilateral expiratory wheezing with prolonged expiratory phase. Musculoskeletal:     Cervical back: Neck supple.  Lymphadenopathy:     Cervical: No cervical adenopathy.  Skin:    Capillary Refill: Capillary refill takes less than 2 seconds.     Coloration: Skin is not jaundiced or pale.  Neurological:     General: No focal deficit present.     Mental Status: She is alert and oriented to person, place, and time.  Psychiatric:        Behavior: Behavior normal.      UC Treatments / Results  Labs (all labs ordered are listed, but only abnormal results are displayed) Labs Reviewed - No data to display  EKG   Radiology No results found.  Procedures Procedures (including critical care time)  Medications Ordered in  UC Medications  ipratropium-albuterol (DUONEB) 0.5-2.5 (3) MG/3ML nebulizer solution 3 mL (3 mLs Nebulization Given 11/12/22 2010)  Initial Impression / Assessment and Plan / UC Course  I have reviewed the triage vital signs and the nursing notes.  Pertinent labs & imaging results that were available during my care of the patient were reviewed by me and considered in my medical decision making (see chart for details).       DuoNeb is given here to try to relieve some of her bronchospasm and wheezing and "chest congestion" here in the office.  Since I am going to treat with antibiotics for possible sinus infection, it would also cover if she happened to have a pneumonia now.  Nebulization treatment she does have some relief, though she is still wheezing.  On reexamination she is still having some prolonged expiratory phase but air movement is much better. Antibiotic is sent in for possible sinus infection and it would cover her for; a longer prednisone taper is sent in and we are dispensing a nebulizer here and albuterol is sent into the pharmacy for nebulizer  Her chart states an allergy to Keflex, but she has tolerated Augmentin without problem in the past  Final Clinical Impressions(s) / UC Diagnoses   Final diagnoses:  Acute frontal sinusitis, recurrence not specified  Mild intermittent asthma with exacerbation     Discharge Instructions      Use albuterol in the nebulizer every 4 hours as needed for shortness of breath or wheezing  Take amoxicillin-clavulanate 875 mg--1 tab twice daily with food for 7 days  Take prednisone 20 mg--3 tabs daily x3 days, then 2 tabs daily x3 days, then 1 tab daily x3 days, then one half tab daily x3 days, then stop       ED Prescriptions     Medication Sig Dispense Auth. Provider   albuterol (PROVENTIL) (2.5 MG/3ML) 0.083% nebulizer solution Take 3 mLs (2.5 mg total) by nebulization every 4 (four) hours as needed for wheezing or  shortness of breath. 225 mL Barrett Henle, MD   predniSONE (DELTASONE) 20 MG tablet 3 tabs daily x3 days, then 2 tabs daily x3 days, then 1 tab daily x3 days, then one half tab daily x3 days, then stop 20 tablet Eean Buss, Gwenlyn Perking, MD   amoxicillin-clavulanate (AUGMENTIN) 875-125 MG tablet Take 1 tablet by mouth 2 (two) times daily for 7 days. 14 tablet Sady Monaco, Gwenlyn Perking, MD      PDMP not reviewed this encounter.   Barrett Henle, MD 11/12/22 2031

## 2022-11-12 NOTE — Discharge Instructions (Signed)
Use albuterol in the nebulizer every 4 hours as needed for shortness of breath or wheezing  Take amoxicillin-clavulanate 875 mg--1 tab twice daily with food for 7 days  Take prednisone 20 mg--3 tabs daily x3 days, then 2 tabs daily x3 days, then 1 tab daily x3 days, then one half tab daily x3 days, then stop

## 2022-11-13 ENCOUNTER — Telehealth (HOSPITAL_COMMUNITY): Payer: Self-pay | Admitting: Emergency Medicine

## 2022-11-13 MED ORDER — BACID PO TABS: 2.0000 | ORAL_TABLET | Freq: Three times a day (TID) | ORAL | 0 refills | Status: AC

## 2022-11-13 MED ORDER — FLUCONAZOLE 150 MG PO TABS: 150.0000 mg | ORAL_TABLET | Freq: Once | ORAL | 0 refills | Status: AC

## 2023-01-18 ENCOUNTER — Ambulatory Visit (INDEPENDENT_AMBULATORY_CARE_PROVIDER_SITE_OTHER): Payer: Self-pay

## 2023-01-18 ENCOUNTER — Encounter (HOSPITAL_COMMUNITY): Payer: Self-pay

## 2023-01-18 ENCOUNTER — Ambulatory Visit (HOSPITAL_COMMUNITY)
Admission: EM | Admit: 2023-01-18 | Discharge: 2023-01-18 | Disposition: A | Discharge status: Home / Self Care | Payer: Self-pay | Attending: Emergency Medicine | Admitting: Emergency Medicine

## 2023-01-18 DIAGNOSIS — M79671 Pain in right foot: Secondary | ICD-10-CM

## 2023-01-18 DIAGNOSIS — S93401A Sprain of unspecified ligament of right ankle, initial encounter: Secondary | ICD-10-CM

## 2023-01-18 DIAGNOSIS — W19XXXA Unspecified fall, initial encounter: Secondary | ICD-10-CM

## 2023-01-18 NOTE — ED Provider Notes (Signed)
Carrie [name]    CSN: [id number] Arrival date & time: 01/18/23  1245      History   Chief Complaint Chief Complaint  Patient presents with   Ankle Injury    HPI [name] is a 62 y.o. female.   Patient presents to clinic after right ankle injury.  She tripped and fell over an extension cord, there was some clothing on the ground that could have braced her fall but she felt her ankle twist outward.  She was initially able to ambulate after the incident, but since then her pain is increased and she has had increasing pain with ambulation.   Denies obvious swelling or bruising.  She drove to the clinic today.  Presents in wheelchair.  The history is provided by the patient.  Ankle Injury Pertinent negatives include no chest pain, no abdominal pain and no shortness of breath.    Past Medical History:  Diagnosis Date   Arthritis    High cholesterol    Kidney stones    Ovarian cyst     Patient Active Problem List   Diagnosis Date Noted   Obese 01/31/2022   Hypokalemia 01/31/2022   Hypophosphatemia 01/31/2022   Elevated troponin 01/28/2022   Sinus tachycardia 01/28/2022   Acute pulmonary embolism ([place]) 01/27/2022   Pulmonary emboli ([facility]) 01/27/2022   LEUKOCYTOPENIA UNSPECIFIED 11/07/2010   DYSLEXIA 11/07/2010   HYPHEMA 11/07/2010   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/07/2010   OVARIAN CYST 11/07/2010   NONSPEC ELEVATION OF LEVELS OF TRANSAMINASE/LDH 11/07/2010   NEPHROLITHIASIS, HX OF 11/07/2010    Past Surgical History:  Procedure Laterality Date   IR ANGIOGRAM PULMONARY BILATERAL SELECTIVE  01/28/2022   IR ANGIOGRAM SELECTIVE EACH ADDITIONAL VESSEL  01/28/2022   IR INFUSION THROMBOL ARTERIAL INITIAL (MS)  01/28/2022   IR INFUSION THROMBOL ARTERIAL INITIAL (MS)  01/28/2022   IR THROMB F/U EVAL ART/VEN FINAL DAY (MS)  01/29/2022   IR US GUIDE VASC ACCESS RIGHT  01/28/2022   IR US GUIDE VASC ACCESS RIGHT  01/28/2022   OVARIAN CYST REMOVAL      OB History    No obstetric history on file.      Home Medications    Prior to Admission medications   Medication Sig Start Date End Date Taking? Authorizing Provider  aspirin EC 81 MG tablet Take 81 mg by mouth daily. Swallow whole.   Yes [provider]  albuterol (PROVENTIL) (2.5 MG/3ML) 0.083% nebulizer solution Take 3 mLs (2.5 mg total) by nebulization every 4 (four) hours as needed for wheezing or shortness of breath. 11/12/22   Banister, Gwenlyn Perking, MD  albuterol (VENTOLIN HFA) 108 (90 Base) MCG/ACT inhaler Inhale 2 puffs into the lungs every 6 (six) hours as needed for wheezing or shortness of breath. 11/06/22   Flossie Dibble, NP    Family History Family History  Problem Relation Age of Onset   Pulmonary embolism Father     Social History Social History   Tobacco Use   Smoking status: Never   Smokeless tobacco: Never  Vaping Use   Vaping Use: Never used  Substance Use Topics   Alcohol use: No   Drug use: No     Allergies   Iodinated contrast media, Ketamine, Gluten meal, Lactose intolerance (gi), Other, Wheat, and Cephalexin   Review of Systems Review of Systems  Constitutional:  Negative for chills and fever.  HENT:  Negative for ear pain and sore throat.   Eyes:  Negative for pain and visual  disturbance.  Respiratory:  Negative for cough and shortness of breath.   Cardiovascular:  Negative for chest pain and palpitations.  Gastrointestinal:  Negative for abdominal pain and vomiting.  Genitourinary:  Negative for dysuria and hematuria.  Musculoskeletal:  Positive for arthralgias and gait problem. Negative for back pain.  Skin:  Negative for color change and rash.  Neurological:  Negative for seizures and syncope.  All other systems reviewed and are negative.    Physical Exam Triage Vital Signs ED Triage Vitals  Enc Vitals Group     BP 01/18/23 1411 126/76     Pulse Rate 01/18/23 1411 83     Resp 01/18/23 1411 18     Temp 01/18/23 1411 97.9 F (36.6  C)     Temp Source 01/18/23 1411 Oral     SpO2 01/18/23 1411 94 %     Weight 01/18/23 1411 210 lb (95.3 kg)     Height 01/18/23 1411 5\' 4"  (1.626 m)     Head Circumference --      Peak Flow --      Pain Score 01/18/23 1408 6     Pain Loc --      Pain Edu? --      Excl. in [place]? --    No data found.  Updated Vital Signs BP 126/76 (BP Location: Left Wrist)   Pulse 83   Temp 97.9 F (36.6 C) (Oral)   Resp 18   Ht 5\' 4"  (1.626 m)   Wt 210 lb (95.3 kg)   SpO2 94%   BMI 36.05 kg/m   Visual Acuity Right Eye Distance:   Left Eye Distance:   Bilateral Distance:    Right Eye Near:   Left Eye Near:    Bilateral Near:     Physical Exam Vitals and nursing note reviewed.  Constitutional:      General: She is not in acute distress.    Appearance: She is well-developed.  HENT:     Head: Normocephalic and atraumatic.     Right Ear: External ear normal.     Left Ear: External ear normal.     Mouth/Throat:     Mouth: Mucous membranes are moist.  Eyes:     Conjunctiva/sclera: Conjunctivae normal.  Cardiovascular:     Rate and Rhythm: Normal rate and regular rhythm.     Pulses:          Dorsalis pedis pulses are 2+ on the right side and 2+ on the left side.  Pulmonary:     Effort: Pulmonary effort is normal. No respiratory distress.  Musculoskeletal:        General: Tenderness and signs of injury present. No swelling or deformity. Normal range of motion.     Cervical back: Neck supple.     Right lower leg: No edema.     Left lower leg: No edema.       Feet:  Feet:     Right foot:     Skin integrity: Skin integrity normal.     Left foot:     Skin integrity: Skin integrity normal.     Comments: L foot ROM intact, pedal pulse 2+ Sensation intact, brisk capillary refill  Without obvious ecchymosis or deformity  Tenderness to palpation along first and second metatarsals   Skin:    General: Skin is warm and dry.     Capillary Refill: Capillary refill takes less than 2  seconds.  Neurological:     Mental Status: She  is alert.  Psychiatric:        Mood and Affect: Mood normal.      UC Treatments / Results  Labs (all labs ordered are listed, but only abnormal results are displayed) Labs Reviewed - No data to display  EKG   Radiology DG Foot Complete Right  Result Date: 01/18/2023 CLINICAL DATA:  Fall, pain EXAM: RIGHT FOOT COMPLETE - 3+ VIEW COMPARISON:  None Available. FINDINGS: There is no evidence of fracture or dislocation. There is no evidence of arthropathy or other focal bone abnormality. Diffuse soft tissue edema about the foot and ankle. IMPRESSION: No fracture or dislocation of the right foot. Diffuse soft tissue edema. Electronically Signed   By: Delanna Ahmadi M.D.   On: 01/18/2023 14:54    Procedures Procedures (including critical care time)  Medications Ordered in UC Medications - No data to display  Initial Impression / Assessment and Plan / UC Course  I have reviewed the triage vital signs and the nursing notes.  Pertinent labs & imaging results that were available during my care of the patient were reviewed by me and considered in my medical decision making (see chart for details).  Vitals in triage reviewed, patient is hemodynamically stable.  Point tenderness along first and second metatarsal of right foot, without obvious ecchymosis, swelling or deformity.  Pedal pulses intact, brisk capillary refill, appears neurovascularly intact.  Right foot imaging negative for acute fracture or dislocation.  Will treat ankle sprain with the RICE method, advised to follow-up with orthopedics if symptoms persist beyond the next few weeks.  Patient verbalized understanding, return and follow-up care discussed.     Final Clinical Impressions(s) / UC Diagnoses   Final diagnoses:  Sprain of right ankle, unspecified ligament, initial encounter     Discharge Instructions      Please use the RICE method for your ankle sprain.  I have  attached some rehab they can start working on over the weekend or early Monday.  You can take Tylenol or ibuprofen as needed for pain and discomfort.  Please try and elevate your foot and stay off of it.  If your pain persists over the next few weeks, please follow-up with [facility] for further care and evaluation.  Please return to clinic if you develop worsening of pain, drastic increase in swelling, numbness, tingling or any changes of condition.      ED Prescriptions   None    PDMP not reviewed this encounter.   Merion Caton, [place], [place] 01/18/23 1515

## 2023-01-18 NOTE — ED Triage Notes (Signed)
Patient fell and injured the right ankle today. [place] over an extension cord. No history of problems with the right ankle. Ankle swollen and unable to bear weight.

## 2023-01-18 NOTE — Discharge Instructions (Addendum)
Please use the RICE method for your ankle sprain.  I have attached some rehab they can start working on over the weekend or early Monday.  You can take Tylenol or ibuprofen as needed for pain and discomfort.  Please try and elevate your foot and stay off of it.  If your pain persists over the next few weeks, please follow-up with [facility] for further care and evaluation.  Please return to clinic if you develop worsening of pain, drastic increase in swelling, numbness, tingling or any changes of condition.

## 2023-03-25 ENCOUNTER — Ambulatory Visit (HOSPITAL_COMMUNITY)
Admission: EM | Admit: 2023-03-25 | Discharge: 2023-03-25 | Disposition: A | Discharge status: Home / Self Care | Payer: Self-pay | Attending: Internal Medicine | Admitting: Internal Medicine

## 2023-03-25 ENCOUNTER — Encounter (HOSPITAL_COMMUNITY): Payer: Self-pay | Admitting: *Deleted

## 2023-03-25 ENCOUNTER — Emergency Department (HOSPITAL_COMMUNITY): Payer: Self-pay

## 2023-03-25 ENCOUNTER — Inpatient Hospital Stay (HOSPITAL_COMMUNITY)
Admission: EM | Admit: 2023-03-25 | Discharge: 2023-03-27 | DRG: 175 | Disposition: A | Discharge status: Home / Self Care | Payer: Self-pay | Attending: Internal Medicine | Admitting: Internal Medicine

## 2023-03-25 ENCOUNTER — Other Ambulatory Visit: Payer: Self-pay

## 2023-03-25 ENCOUNTER — Encounter (HOSPITAL_COMMUNITY): Payer: Self-pay

## 2023-03-25 ENCOUNTER — Emergency Department (HOSPITAL_BASED_OUTPATIENT_CLINIC_OR_DEPARTMENT_OTHER): Payer: Self-pay

## 2023-03-25 DIAGNOSIS — R0600 Dyspnea, unspecified: Secondary | ICD-10-CM

## 2023-03-25 DIAGNOSIS — M1990 Unspecified osteoarthritis, unspecified site: Secondary | ICD-10-CM | POA: Diagnosis present

## 2023-03-25 DIAGNOSIS — E8770 Fluid overload, unspecified: Secondary | ICD-10-CM | POA: Diagnosis present

## 2023-03-25 DIAGNOSIS — R600 Localized edema: Secondary | ICD-10-CM

## 2023-03-25 DIAGNOSIS — Z91041 Radiographic dye allergy status: Secondary | ICD-10-CM

## 2023-03-25 DIAGNOSIS — E7800 Pure hypercholesterolemia, unspecified: Secondary | ICD-10-CM | POA: Diagnosis present

## 2023-03-25 DIAGNOSIS — Z7982 Long term (current) use of aspirin: Secondary | ICD-10-CM

## 2023-03-25 DIAGNOSIS — I2699 Other pulmonary embolism without acute cor pulmonale: Secondary | ICD-10-CM | POA: Diagnosis present

## 2023-03-25 DIAGNOSIS — I2609 Other pulmonary embolism with acute cor pulmonale: Principal | ICD-10-CM

## 2023-03-25 DIAGNOSIS — R609 Edema, unspecified: Secondary | ICD-10-CM

## 2023-03-25 DIAGNOSIS — Z79899 Other long term (current) drug therapy: Secondary | ICD-10-CM

## 2023-03-25 DIAGNOSIS — Z881 Allergy status to other antibiotic agents status: Secondary | ICD-10-CM

## 2023-03-25 DIAGNOSIS — R0602 Shortness of breath: Secondary | ICD-10-CM

## 2023-03-25 DIAGNOSIS — Z86711 Personal history of pulmonary embolism: Secondary | ICD-10-CM

## 2023-03-25 DIAGNOSIS — D649 Anemia, unspecified: Secondary | ICD-10-CM | POA: Diagnosis present

## 2023-03-25 DIAGNOSIS — Z91199 Patient's noncompliance with other medical treatment and regimen due to unspecified reason: Secondary | ICD-10-CM

## 2023-03-25 DIAGNOSIS — D638 Anemia in other chronic diseases classified elsewhere: Secondary | ICD-10-CM | POA: Diagnosis present

## 2023-03-25 DIAGNOSIS — Z91018 Allergy to other foods: Secondary | ICD-10-CM

## 2023-03-25 DIAGNOSIS — Z86718 Personal history of other venous thrombosis and embolism: Secondary | ICD-10-CM

## 2023-03-25 DIAGNOSIS — E739 Lactose intolerance, unspecified: Secondary | ICD-10-CM | POA: Diagnosis present

## 2023-03-25 HISTORY — DX: Other pulmonary embolism without acute cor pulmonale: I26.99

## 2023-03-25 HISTORY — DX: Acute embolism and thrombosis of unspecified deep veins of unspecified lower extremity: I82.409

## 2023-03-25 LAB — TROPONIN I (HIGH SENSITIVITY)
Troponin I (High Sensitivity): 3 ng/L (ref <18)
Troponin I (High Sensitivity): 5 ng/L (ref <18)

## 2023-03-25 LAB — CBC WITH DIFFERENTIAL/PLATELET
Abs Immature Granulocytes: 0.01 10*3/uL (ref 0.00–0.07)
Basophils Absolute: 0 10*3/uL (ref 0.0–0.1)
Basophils Relative: 1 %
Eosinophils Absolute: 0.1 10*3/uL (ref 0.0–0.5)
Eosinophils Relative: 2 %
HCT: 38.2 % (ref 36.0–46.0)
Hemoglobin: 11.6 g/dL — ABNORMAL LOW (ref 12.0–15.0)
Immature Granulocytes: 0 %
Lymphocytes Relative: 21 %
Lymphs Abs: 1.2 10*3/uL (ref 0.7–4.0)
MCH: 26.4 pg (ref 26.0–34.0)
MCHC: 30.4 g/dL (ref 30.0–36.0)
MCV: 86.8 fL (ref 80.0–100.0)
Monocytes Absolute: 0.4 10*3/uL (ref 0.1–1.0)
Monocytes Relative: 8 %
Neutro Abs: 3.7 10*3/uL (ref 1.7–7.7)
Neutrophils Relative %: 68 %
Platelets: 217 10*3/uL (ref 150–400)
RBC: 4.4 MIL/uL (ref 3.87–5.11)
RDW: 15.3 % (ref 11.5–15.5)
WBC: 5.4 10*3/uL (ref 4.0–10.5)
nRBC: 0 % (ref 0.0–0.2)

## 2023-03-25 LAB — URINALYSIS, ROUTINE W REFLEX MICROSCOPIC
Bilirubin Urine: NEGATIVE
Glucose, UA: NEGATIVE mg/dL
Hgb urine dipstick: NEGATIVE
Ketones, ur: NEGATIVE mg/dL
Leukocytes,Ua: NEGATIVE
Nitrite: NEGATIVE
Protein, ur: NEGATIVE mg/dL
Specific Gravity, Urine: 1.016 (ref 1.005–1.030)
pH: 6 (ref 5.0–8.0)

## 2023-03-25 LAB — I-STAT CHEM 8, ED
BUN: 13 mg/dL (ref 8–23)
Calcium, Ion: 1.04 mmol/L — ABNORMAL LOW (ref 1.15–1.40)
Chloride: 112 mmol/L — ABNORMAL HIGH (ref 98–111)
Creatinine, Ser: 0.5 mg/dL (ref 0.44–1.00)
Glucose, Bld: 87 mg/dL (ref 70–99)
HCT: 35 % — ABNORMAL LOW (ref 36.0–46.0)
Hemoglobin: 11.9 g/dL — ABNORMAL LOW (ref 12.0–15.0)
Potassium: 3.8 mmol/L (ref 3.5–5.1)
Sodium: 143 mmol/L (ref 135–145)
TCO2: 21 mmol/L — ABNORMAL LOW (ref 22–32)

## 2023-03-25 LAB — COMPREHENSIVE METABOLIC PANEL
ALT: 16 U/L (ref 0–44)
AST: 18 U/L (ref 15–41)
Albumin: 2.9 g/dL — ABNORMAL LOW (ref 3.5–5.0)
Alkaline Phosphatase: 45 U/L (ref 38–126)
Anion gap: 10 (ref 5–15)
BUN: 12 mg/dL (ref 8–23)
CO2: 20 mmol/L — ABNORMAL LOW (ref 22–32)
Calcium: 8.2 mg/dL — ABNORMAL LOW (ref 8.9–10.3)
Chloride: 112 mmol/L — ABNORMAL HIGH (ref 98–111)
Creatinine, Ser: 0.62 mg/dL (ref 0.44–1.00)
GFR, Estimated: >60 mL/min (ref >60)
Glucose, Bld: 89 mg/dL (ref 70–99)
Potassium: 3.8 mmol/L (ref 3.5–5.1)
Sodium: 142 mmol/L (ref 135–145)
Total Bilirubin: 0.4 mg/dL (ref 0.3–1.2)
Total Protein: 5.2 g/dL — ABNORMAL LOW (ref 6.5–8.1)

## 2023-03-25 LAB — TYPE AND SCREEN
ABO/RH(D): A POS
Antibody Screen: NEGATIVE

## 2023-03-25 LAB — PROTIME-INR
INR: 1 (ref 0.8–1.2)
Prothrombin Time: 13.8 seconds (ref 11.4–15.2)

## 2023-03-25 LAB — D-DIMER, QUANTITATIVE: D-Dimer, Quant: 3.26 ug/mL-FEU — ABNORMAL HIGH (ref 0.00–0.50)

## 2023-03-25 LAB — BRAIN NATRIURETIC PEPTIDE: B Natriuretic Peptide: 27.4 pg/mL (ref 0.0–100.0)

## 2023-03-25 MED ORDER — ACETAMINOPHEN 650 MG RE SUPP: 650.0000 mg | Freq: Four times a day (QID) | RECTAL | Status: DC | PRN

## 2023-03-25 MED ORDER — HEPARIN (PORCINE) 25000 UT/250ML-% IV SOLN
1300.0000 [IU]/h | INTRAVENOUS | Status: DC
  Administered 2023-03-25: 1300 [IU]/h via INTRAVENOUS
  Filled 2023-03-25: qty 250

## 2023-03-25 MED ORDER — DIPHENHYDRAMINE HCL 25 MG PO CAPS: 50.0000 mg | ORAL_CAPSULE | Freq: Once | ORAL | Status: AC

## 2023-03-25 MED ORDER — IOHEXOL 350 MG/ML SOLN
75.0000 mL | Freq: Once | INTRAVENOUS | Status: AC | PRN
  Administered 2023-03-25: 75 mL via INTRAVENOUS

## 2023-03-25 MED ORDER — MELATONIN 3 MG PO TABS: 3.0000 mg | ORAL_TABLET | Freq: Every evening | ORAL | Status: DC | PRN

## 2023-03-25 MED ORDER — ONDANSETRON HCL 4 MG/2ML IJ SOLN: 4.0000 mg | Freq: Four times a day (QID) | INTRAMUSCULAR | Status: DC | PRN

## 2023-03-25 MED ORDER — HEPARIN BOLUS VIA INFUSION
4500.0000 [IU] | Freq: Once | INTRAVENOUS | Status: AC
  Administered 2023-03-25: 4500 [IU] via INTRAVENOUS
  Filled 2023-03-25: qty 4500

## 2023-03-25 MED ORDER — METHYLPREDNISOLONE SODIUM SUCC 40 MG IJ SOLR
40.0000 mg | Freq: Once | INTRAMUSCULAR | Status: AC
  Administered 2023-03-25: 40 mg via INTRAVENOUS
  Filled 2023-03-25: qty 1

## 2023-03-25 MED ORDER — SODIUM CHLORIDE 0.9 % IV SOLN: INTRAVENOUS | Status: DC

## 2023-03-25 MED ORDER — DIPHENHYDRAMINE HCL 50 MG/ML IJ SOLN
50.0000 mg | Freq: Once | INTRAMUSCULAR | Status: AC
  Administered 2023-03-25: 50 mg via INTRAVENOUS
  Filled 2023-03-25: qty 1

## 2023-03-25 MED ORDER — ACETAMINOPHEN 325 MG PO TABS: 650.0000 mg | ORAL_TABLET | Freq: Four times a day (QID) | ORAL | Status: DC | PRN

## 2023-03-25 NOTE — ED Triage Notes (Addendum)
Pt reports Bil.leg swelling and Bil foot swelling . Pt also has pain tio all site. Pt reports she had blood clot in her leg last year. Pt is not taking blood thinners at this time. Pt also has circled a site on LT lower leg  that is painful when walking

## 2023-03-25 NOTE — H&P (Signed)
History and Physical      Carrie [name] ZOX:[id number] DOB: Sep 25, 1961 DOA: 03/25/2023; DOS: 03/25/2023  PCP: Pcp, No (will further assess) Patient coming from: home   I have personally briefly reviewed patient's old medical records in [facility]  Chief Complaint: Shortness of breath  HPI: Carrie [name] is a 62 y.o. female with medical history significant for previous pulmonary embolism/DVT, who is admitted to [facility] on 03/25/2023 with acute bilateral pulmonary emboli with suggestion of right heart strain after presenting from home to [facility] ED complaining of shortness of breath.   The patient reports to 3 days of progressive shortness of breath associated with worsening of edema in the bilateral lower extremities, in the absence of any associated calf tenderness or new lower extremity erythema.  She conveys that her shortness of breath not associate with any orthopnea or PND.  She denies any associated chest pain, palpitations, diaphoresis, nausea, vomiting, dizziness, presyncope, or syncope.  No recent cough or hemoptysis.  Denies any wheezing.  She also denies any recent subjective fever, chills, rigors, generalized myalgias.  She acknowledges a history of pulmonary emboli along with lower extremity DVT, noting that she completed a course of Xarelto for this, and has not been off of Xarelto for approximately 6 months.  In the interval, she has not been on any form of anticoagulation.  In addition to her personal history of DVT/PE, she also acknowledges a family history that is positive for pulmonary embolism in her father, who passed away secondary to complications from a pulmonary embolism.  She is unsure if she presents with a family history for inheritable hypercoagulable state formally.   Denies any recent trauma, surgical procedures, or recent periods of diminished ambulatory activity. Denies smoking, estrogen replacement therapy, or use of OCPs.  No history of GI bleed.  Per  chart review, her baseline hemoglobin appears to be 12-13.    ED Course:  Vital signs in the ED were notable for the following: Afebrile; heart rates in the 70s to 90s; systolic blood pressures in the 1 teens to 120s; respiratory rate 15-23, oxygen saturation 95% on room air.  Labs were notable for the following: CMP notable for the following: Sodium 142, creatinine 0.62, calcium, just for mild hypokalemia noted to be 9.0, abdomen 2.9, liver enzymes, otherwise within normal limits.  BNP 2 7.  High-sensitivity troponin I initially 3, with repeat value trending up slightly to 5.  D-dimer 3.26.  CBC notable for will with cell count 5400, hemoglobin 11.6 cystinosis/recurrent properties and relative to his prior hemoglobin value of 12.1, platelet count 217.  INR 1.0.  Urinalysis notable for no white blood cells, leukocyte esterase/nitrate negative, and demonstrate no evidence of hemoglobin or RBCs.  Per my interpretation, EKG in ED demonstrated the following: Sinus rhythm with heart rate 95, normal intervals, and no evidence of T wave ST changes, including evidence of ST elevation.  Imaging and additional notable ED work-up: CT chest showed evidence of acute bilateral pulmonary emboli of moderate burden, with CT evidence of right heart strain.  Otherwise, CTA chest showed no evidence of acute cardiopulmonary process, including no evidence of infiltrate, edema, effusion, or pneumothorax.  Venous ultrasound of the bilateral lower extremities show no evidence of acute DVTs.  While in the ED, the following were administered: Benadryl 50 mg IV x 1 dose, Solu-Medrol 40 mg IV x 1 dose, heparin bolus followed by initiation heparin drip.  Subsequently, the patient was admitted for further evaluation management of  acute bilateral pulmonary emboli with evidence of right heart strain, with presenting labs notable for acute anemia.     Review of Systems: As per HPI otherwise 10 point review of systems negative.    Past Medical History:  Diagnosis Date   Arthritis    DVT (deep venous thrombosis) (HCC)    High cholesterol    Kidney stones    Ovarian cyst     Past Surgical History:  Procedure Laterality Date   IR ANGIOGRAM PULMONARY BILATERAL SELECTIVE  01/28/2022   IR ANGIOGRAM SELECTIVE EACH ADDITIONAL VESSEL  01/28/2022   IR INFUSION THROMBOL ARTERIAL INITIAL (MS)  01/28/2022   IR INFUSION THROMBOL ARTERIAL INITIAL (MS)  01/28/2022   IR THROMB F/U EVAL ART/VEN FINAL DAY (MS)  01/29/2022   IR US GUIDE VASC ACCESS RIGHT  01/28/2022   IR US GUIDE VASC ACCESS RIGHT  01/28/2022   OVARIAN CYST REMOVAL      Social History:  reports that she has never smoked. She has never used smokeless tobacco. She reports that she does not drink alcohol and does not use drugs.   Allergies  Allergen Reactions   Iodinated Contrast Media Anaphylaxis   Ketamine Anaphylaxis   Gluten Meal Nausea Only    Makes stomach hurt also   Lactose Intolerance (Gi) Diarrhea and Nausea Only    Makes stomach hurt also   Other Swelling and Rash    Radioactive dyes, heavy metals like nickel   Wheat Diarrhea and Nausea Only    Makes stomach hurt also   Cephalexin Itching    Family History  Problem Relation Age of Onset   Pulmonary embolism Father     Family history reviewed and not pertinent    Prior to Admission medications   Medication Sig Start Date End Date Taking? Authorizing Provider  albuterol (PROVENTIL) (2.5 MG/3ML) 0.083% nebulizer solution Take 3 mLs (2.5 mg total) by nebulization every 4 (four) hours as needed for wheezing or shortness of breath. 11/12/22   Banister, Janace Aris, MD  albuterol (VENTOLIN HFA) 108 (90 Base) MCG/ACT inhaler Inhale 2 puffs into the lungs every 6 (six) hours as needed for wheezing or shortness of breath. 11/06/22   Debby Freiberg, NP  aspirin EC 81 MG tablet Take 81 mg by mouth daily. Swallow whole.    [provider]     Objective    Physical Exam: Vitals:   03/25/23  1607 03/25/23 1700 03/25/23 1800 03/25/23 1915  BP:  115/73  124/72  Pulse:  78  92  Resp:  17 20 19   Temp: (!) 97.5 F (36.4 C)   98.7 F (37.1 C)  TempSrc: Oral   Oral  SpO2:  96%  95%  Weight:   95.3 kg   Height:   5\' 4"  (1.626 m)     General: appears to be stated age; alert, oriented Skin: warm, dry, no rash Head:  AT/[place] Mouth:  Oral mucosa membranes appear moist, normal dentition Neck: supple; trachea midline Heart:  RRR; did not appreciate any M/R/G Lungs: CTAB, did not appreciate any wheezes, rales, or rhonchi Abdomen: + BS; soft, ND, NT Vascular: 2+ pedal pulses b/l; 2+ radial pulses b/l Extremities: Nonpitting edema in the bilateral lower extremities no muscle wasting Neuro: strength and sensation intact in upper and lower extremities b/l    Labs on Admission: I have personally reviewed following labs and imaging studies  CBC: Recent Labs  Lab 03/25/23 1008 03/25/23 1034  WBC 5.4  --  NEUTROABS 3.7  --   HGB 11.6* 11.9*  HCT 38.2 35.0*  MCV 86.8  --   PLT 217  --    Basic Metabolic Panel: Recent Labs  Lab 03/25/23 1008 03/25/23 1034  NA 142 143  K 3.8 3.8  CL 112* 112*  CO2 20*  --   GLUCOSE 89 87  BUN 12 13  CREATININE 0.62 0.50  CALCIUM 8.2*  --    GFR: Estimated Creatinine Clearance: 81.6 mL/min (by C-G formula based on SCr of 0.5 mg/dL). Liver Function Tests: Recent Labs  Lab 03/25/23 1008  AST 18  ALT 16  ALKPHOS 45  BILITOT 0.4  PROT 5.2*  ALBUMIN 2.9*   No results for input(s): "LIPASE", "AMYLASE" in the last 168 hours. No results for input(s): "AMMONIA" in the last 168 hours. Coagulation Profile: Recent Labs  Lab 03/25/23 1008  INR 1.0   Cardiac Enzymes: No results for input(s): "CKTOTAL", "CKMB", "CKMBINDEX", "TROPONINI" in the last 168 hours. BNP (last 3 results) No results for input(s): "PROBNP" in the last 8760 hours. HbA1C: No results for input(s): "HGBA1C" in the last 72 hours. CBG: No results for input(s):  "GLUCAP" in the last 168 hours. Lipid Profile: No results for input(s): "CHOL", "HDL", "LDLCALC", "TRIG", "CHOLHDL", "LDLDIRECT" in the last 72 hours. Thyroid Function Tests: No results for input(s): "TSH", "T4TOTAL", "FREET4", "T3FREE", "THYROIDAB" in the last 72 hours. Anemia Panel: No results for input(s): "VITAMINB12", "FOLATE", "FERRITIN", "TIBC", "IRON", "RETICCTPCT" in the last 72 hours. Urine analysis:    Component Value Date/Time   COLORURINE YELLOW 03/25/2023 1100   APPEARANCEUR CLEAR 03/25/2023 1100   LABSPEC 1.016 03/25/2023 1100   PHURINE 6.0 03/25/2023 1100   GLUCOSEU NEGATIVE 03/25/2023 1100   HGBUR NEGATIVE 03/25/2023 1100   BILIRUBINUR NEGATIVE 03/25/2023 1100   KETONESUR NEGATIVE 03/25/2023 1100   PROTEINUR NEGATIVE 03/25/2023 1100   UROBILINOGEN 0.2 01/13/2014 2105   NITRITE NEGATIVE 03/25/2023 1100   LEUKOCYTESUR NEGATIVE 03/25/2023 1100    Radiological Exams on Admission: CT Angio Chest PE W and/or Wo Contrast  Result Date: 03/25/2023 CLINICAL DATA:  Positive D-dimer. EXAM: CT ANGIOGRAPHY CHEST WITH CONTRAST TECHNIQUE: Multidetector CT imaging of the chest was performed using the standard protocol during bolus administration of intravenous contrast. Multiplanar CT image reconstructions and MIPs were obtained to evaluate the vascular anatomy. RADIATION DOSE REDUCTION: This exam was performed according to the departmental dose-optimization program which includes automated exposure control, adjustment of the mA and/or kV according to patient size and/or use of iterative reconstruction technique. CONTRAST:  75mL OMNIPAQUE IOHEXOL 350 MG/ML SOLN COMPARISON:  CT PE protocol 01/28/2022 FINDINGS: Cardiovascular: There is adequate opacification of the pulmonary arteries to the segmental level. Pulmonary emboli are seen within the within the distal left main pulmonary artery extending into segmental and subsegmental branches of the left lower lobe and segmental branches of the  left upper lobe. There also pulmonary emboli seen within segmental and subsegmental branches of the right lower lobe right middle lobe and right upper lobe. Heart and aorta are normal in size. There is no pericardial effusion. Mediastinum/Nodes: No enlarged mediastinal, hilar, or axillary lymph nodes. Thyroid gland, trachea, and esophagus demonstrate no significant findings. Lungs/Pleura: Lungs are clear. No pleural effusion or pneumothorax. Upper Abdomen: No acute findings. There are hypodensities in the liver which are too small to characterize favored as cysts measuring up to 11 mm, unchanged. Musculoskeletal: No chest wall abnormality. No acute or significant osseous findings. Review of the MIP images confirms the above  findings. IMPRESSION: 1. Bilateral pulmonary emboli, moderate burden. Positive for acute PE with CT evidence of right heart strain (RV/LV Ratio = 1.0) consistent with at least submassive (intermediate risk) PE. The presence of right heart strain has been associated with an increased risk of morbidity and mortality. These results were called by telephone at the time of interpretation on 03/25/2023 at 6:19 pm to provider Dr. Durwin Nora, who verbally acknowledged these results. Electronically Signed   By: Darliss Cheney M.D.   On: 03/25/2023 18:19   VAS [place] LOWER EXTREMITY VENOUS (DVT) (7a-7p)  Result Date: 03/25/2023  Lower Venous DVT Study Patient Name:  Carrie [name]  Date of Exam:   03/25/2023 Medical Rec #: [id number]      Accession #:    [id number] Date of Birth: 12-08-1960      Patient Gender: F Patient Age:   54 years Exam Location:  [facility] Procedure:      VAS [place] LOWER EXTREMITY VENOUS (DVT) Referring Phys: Theron Arista MESSICK --------------------------------------------------------------------------------  Indications: Swelling, and left focal calf pain when walking.  Risk Factors: DVT right peroneal vein 01/27/22. Limitations: Body habitus and pitting edema. Comparison Study: Prior negative  bilateral LEV done 06/17/22 Performing Technologist: Sherren Kerns RVS  Examination Guidelines: A complete evaluation includes B-mode imaging, spectral Doppler, color Doppler, and power Doppler as needed of all accessible portions of each vessel. Bilateral testing is considered an integral part of a complete examination. Limited examinations for reoccurring indications may be performed as noted. The reflux portion of the exam is performed with the patient in reverse Trendelenburg.  +---------+---------------+---------+-----------+----------+--------------+ RIGHT    CompressibilityPhasicitySpontaneityPropertiesThrombus Aging +---------+---------------+---------+-----------+----------+--------------+ CFV      Full           Yes      Yes                                 +---------+---------------+---------+-----------+----------+--------------+ SFJ      Full                                                        +---------+---------------+---------+-----------+----------+--------------+ FV Prox  Full                                                        +---------+---------------+---------+-----------+----------+--------------+ FV Mid   Full           Yes      Yes                                 +---------+---------------+---------+-----------+----------+--------------+ FV DistalFull                                                        +---------+---------------+---------+-----------+----------+--------------+ PFV      Full                                                        +---------+---------------+---------+-----------+----------+--------------+  POP      Full           Yes      Yes                                 +---------+---------------+---------+-----------+----------+--------------+ PTV      Full                                                        +---------+---------------+---------+-----------+----------+--------------+ PERO     Full                                                         +---------+---------------+---------+-----------+----------+--------------+   +---------+---------------+---------+-----------+----------+--------------+ LEFT     CompressibilityPhasicitySpontaneityPropertiesThrombus Aging +---------+---------------+---------+-----------+----------+--------------+ CFV      Full           Yes      Yes                                 +---------+---------------+---------+-----------+----------+--------------+ SFJ      Full                                                        +---------+---------------+---------+-----------+----------+--------------+ FV Prox  Full                                                        +---------+---------------+---------+-----------+----------+--------------+ FV Mid   Full           Yes      Yes                                 +---------+---------------+---------+-----------+----------+--------------+ FV DistalFull                                                        +---------+---------------+---------+-----------+----------+--------------+ PFV      Full                                                        +---------+---------------+---------+-----------+----------+--------------+ POP      Full           Yes      Yes                                 +---------+---------------+---------+-----------+----------+--------------+  PTV      Full                                                        +---------+---------------+---------+-----------+----------+--------------+ PERO     Full                                                        +---------+---------------+---------+-----------+----------+--------------+     Summary: BILATERAL: - No evidence of deep vein thrombosis seen in the lower extremities, bilaterally. -No evidence of popliteal cyst, bilaterally.   *See table(s) above for measurements and observations.  Electronically signed by Lemar Livings MD on 03/25/2023 at 4:39:58 PM.    Final    DG Chest Port 1 View  Result Date: 03/25/2023 CLINICAL DATA:  Shortness of breath. EXAM: PORTABLE CHEST 1 VIEW COMPARISON:  11/06/2022. FINDINGS: Clear lungs. Normal heart size and mediastinal contours. No pleural effusion or pneumothorax. Visualized bones and upper abdomen are unremarkable. IMPRESSION: No evidence of acute cardiopulmonary disease. Electronically Signed   By: Orvan Falconer M.D.   On: 03/25/2023 10:49      Assessment/Plan   Principal Problem:   Bilateral pulmonary embolism (HCC) Active Problems:   Acute anemia     #) Acute bilateral pulmonary emboli with evidence of cor pulmonale: In the context of presenting to 3 days of new onset sob, CTA chest shows evidence of acute bilateral pulm emboli of moderate burden with CT evidence of right heart strain.  In addition to the CT evidence of right heart strain, additional evidence of associated carpal now in the context of patient's prior recent development of worsening edema in the bilateral extremity, without any evidence of DVT on today's bilateral venous ultrasounds of the lower extremities.   No evidence of hypoxia. No evidence of associated hypotension to warrant consideration for TPA administration.   Patient has a history of prior pulmonary emboli/DVT, for which she completed a course of Xarelto, and has subsequently been completely off of any anticoagulation therapy over the course the last 6 months.  In addition to this personal history, she has additional risk factors for recurrence of PE/DVT in the setting of a family history in which her father passed away from complications relating to a pulmonary embolism.  Given this history and risk factors, without overt additional provoking features identified, it appears that lifelong anticoagulation may be indicated at this time.  Heparin drip initiated in the ED today.    Plan: Continue heparin drip  heparin drip. Monitor on telemetry. Monitor for development of hypotension. Prn supplemental O2 in order to maintain oxygen saturations greater than or equal to 92%.  As needed acetaminophen for pain.  recheck CBC in the morning.  Echocardiogram ordered for the morning.  Given increased risk for preload dependent pathophysiology, will also start continuous normal saline at 75 cc/h x 10 hours.  Monitor strict I's and O's and daily weights.               #) Acute normocytic anemia: Relative to the patient's baseline hemoglobin range of 12-13, her presenting hemoglobin is noted to be inferior to this, with presenting hemoglobin found to be 11.6,  associated normocytic/recurrent properties, and nonelevated RDW.  No evidence of active bleed at this time.  However, her presenting acute anemia is notable, particularly in the context of her presenting acute bilateral pulmonary emboli, for which she is being started on formal anticoagulation.  Plan: Add on iron studies, reticulocyte count.  Type and screen.  Repeat CBC in the morning.       DVT prophylaxis:  heparin drip Code Status: Full code Family Communication: none Disposition Plan: Per Rounding Team Consults called: none;  Admission status: Observation     I SPENT GREATER THAN 75  MINUTES IN CLINICAL CARE TIME/MEDICAL DECISION-MAKING IN COMPLETING THIS ADMISSION.      Chaney Born Jesse Nosbisch DO [facility]  From 7PM - 7AM   03/25/2023, 7:28 PM

## 2023-03-25 NOTE — ED Notes (Signed)
Pt ambulated to bathroom after Benadryl admin,.  Very unsteady on feet.

## 2023-03-25 NOTE — ED Notes (Signed)
Pt appears to be comfortable and resting, can observe even RR that are unlabored, pt remains on cardiac monitoring devices, no changes noted, side rails up x2 for safety, NAD noted, plan of care ongoing, call light within reach, no further concerns as of present.   

## 2023-03-25 NOTE — Discharge Instructions (Addendum)
Go to the emergency department for further evaluation.  I am concerned that the swelling in the legs may be related to fluid overload.  You may also need evaluation for blood clot.

## 2023-03-25 NOTE — ED Notes (Signed)
CT notified that Benadryl was given and at what time it was given

## 2023-03-25 NOTE — Progress Notes (Signed)
VASCULAR LAB    Bilateral lower extremity venous duplex has been performed.  See CV proc for preliminary results.  Messaged negative results to Dr. Rodena Medin via secure chat  Rosezetta Schlatter, [facility], RVT 03/25/2023, 11:27 AM

## 2023-03-25 NOTE — Progress Notes (Signed)
Per Dr Kristine Royal:  Radiology (Dr. Lauralyn Primes) agrees with 4 hour prep.

## 2023-03-25 NOTE — ED Provider Notes (Signed)
Patient with history of VTE presenting from urgent care for BLE swelling.  She stopped taking Xarelto 6 months ago.  She has gotten pretreatment for contrasted CT imaging.  She is awaiting CTA of chest.  Physical Exam  BP 121/71 (BP Location: Left Arm)   Pulse 60   Temp 97.8 F (36.6 C) (Oral)   Resp 19   SpO2 99%   Physical Exam Vitals and nursing note reviewed.  Constitutional:      General: She is not in acute distress.    Appearance: Normal appearance. She is well-developed. She is not ill-appearing, toxic-appearing or diaphoretic.  HENT:     Head: Normocephalic and atraumatic.     Right Ear: External ear normal.     Left Ear: External ear normal.     Nose: Nose normal.     Mouth/Throat:     Mouth: Mucous membranes are moist.  Eyes:     Extraocular Movements: Extraocular movements intact.     Conjunctiva/sclera: Conjunctivae normal.  Cardiovascular:     Rate and Rhythm: Normal rate and regular rhythm.  Pulmonary:     Effort: Pulmonary effort is normal. No respiratory distress.  Abdominal:     General: There is no distension.     Palpations: Abdomen is soft.     Tenderness: There is no abdominal tenderness.  Musculoskeletal:        General: No swelling. Normal range of motion.     Cervical back: Normal range of motion and neck supple.     Right lower leg: Edema present.     Left lower leg: Edema present.  Skin:    General: Skin is warm and dry.     Coloration: Skin is not jaundiced or pale.  Neurological:     General: No focal deficit present.     Mental Status: She is alert and oriented to person, place, and time.  Psychiatric:        Mood and Affect: Mood normal.        Behavior: Behavior normal.        Thought Content: Thought content normal.        Judgment: Judgment normal.     Procedures  Procedures  ED Course / MDM    Medical Decision Making Amount and/or Complexity of Data Reviewed Labs: ordered. Radiology: ordered.  Risk Prescription drug  management.   On assessment, patient resting comfortably.  SpO2 remains normal on room air.  Following pretreatment for contrasted study, patient underwent CTA of chest.  Results of this study showed evidence of bilateral PEs with some mild heart strain.  Heparin was started.  Patient was admitted for further management.       Gloris Manchester, MD 03/25/23 [phone]

## 2023-03-25 NOTE — ED Provider Notes (Addendum)
[facility]    CSN: [id number] Arrival date & time: 03/25/23  0820      History   Chief Complaint Chief Complaint  Patient presents with   Leg Swelling   Foot Pain    HPI Carrie [name] is a 62 y.o. female with a history of DVT comes to urgent care with bilateral lower extremity swelling and left calf cramps which started about 4 days ago.  Patient describes the cramp as intermittent and of moderate severity.  Symptoms have worsened over the past few days.  Her leg swelling has also worsened over the past few days.  She has some orthopnea but denies any paroxysmal nocturnal dyspnea.  She endorses shortness of breath at rest and on exertion but denies any chest pain or chest pressure.  She had an episode of dizziness this morning prior to coming to the urgent care.  No recent long distance travel.  No family history of blood clots.  Patient has a personal history of deep vein thrombosis and was treated with Xarelto for about 4 months.  Patient recently fell and twisted her right ankle.   HPI  Past Medical History:  Diagnosis Date   Arthritis    DVT (deep venous thrombosis) (HCC)    High cholesterol    Kidney stones    Ovarian cyst     Patient Active Problem List   Diagnosis Date Noted   Obese 01/31/2022   Hypokalemia 01/31/2022   Hypophosphatemia 01/31/2022   Elevated troponin 01/28/2022   Sinus tachycardia 01/28/2022   Acute pulmonary embolism (HCC) 01/27/2022   Pulmonary emboli (HCC) 01/27/2022   LEUKOCYTOPENIA UNSPECIFIED 11/07/2010   DYSLEXIA 11/07/2010   HYPHEMA 11/07/2010   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/07/2010   OVARIAN CYST 11/07/2010   NONSPEC ELEVATION OF LEVELS OF TRANSAMINASE/LDH 11/07/2010   NEPHROLITHIASIS, HX OF 11/07/2010    Past Surgical History:  Procedure Laterality Date   IR ANGIOGRAM PULMONARY BILATERAL SELECTIVE  01/28/2022   IR ANGIOGRAM SELECTIVE EACH ADDITIONAL VESSEL  01/28/2022   IR INFUSION THROMBOL ARTERIAL INITIAL (MS)   01/28/2022   IR INFUSION THROMBOL ARTERIAL INITIAL (MS)  01/28/2022   IR THROMB F/U EVAL ART/VEN FINAL DAY (MS)  01/29/2022   IR US GUIDE VASC ACCESS RIGHT  01/28/2022   IR US GUIDE VASC ACCESS RIGHT  01/28/2022   OVARIAN CYST REMOVAL      OB History   No obstetric history on file.      Home Medications    Prior to Admission medications   Medication Sig Start Date End Date Taking? Authorizing Provider  aspirin EC 81 MG tablet Take 81 mg by mouth daily. Swallow whole.   Yes [provider]  albuterol (PROVENTIL) (2.5 MG/3ML) 0.083% nebulizer solution Take 3 mLs (2.5 mg total) by nebulization every 4 (four) hours as needed for wheezing or shortness of breath. 11/12/22   Banister, Janace Aris, MD  albuterol (VENTOLIN HFA) 108 (90 Base) MCG/ACT inhaler Inhale 2 puffs into the lungs every 6 (six) hours as needed for wheezing or shortness of breath. 11/06/22   Debby Freiberg, NP    Family History Family History  Problem Relation Age of Onset   Pulmonary embolism Father     Social History Social History   Tobacco Use   Smoking status: Never   Smokeless tobacco: Never  Vaping Use   Vaping Use: Never used  Substance Use Topics   Alcohol use: No   Drug use: No     Allergies  Iodinated contrast media, Ketamine, Gluten meal, Lactose intolerance (gi), Other, Wheat, and Cephalexin   Review of Systems Review of Systems As per HPI  Physical Exam Triage Vital Signs ED Triage Vitals  Enc Vitals Group     BP 03/25/23 0855 118/75     Pulse Rate 03/25/23 0855 88     Resp 03/25/23 0855 20     Temp 03/25/23 0855 (!) 97.5 F (36.4 C)     Temp src --      SpO2 03/25/23 0855 96 %     Weight --      Height --      Head Circumference --      Peak Flow --      Pain Score 03/25/23 0849 7     Pain Loc --      Pain Edu? --      Excl. in GC? --    No data found.  Updated Vital Signs BP 118/75   Pulse 88   Temp (!) 97.5 F (36.4 C)   Resp 20   SpO2 96%   Visual  Acuity Right Eye Distance:   Left Eye Distance:   Bilateral Distance:    Right Eye Near:   Left Eye Near:    Bilateral Near:     Physical Exam Vitals and nursing note reviewed.  Constitutional:      General: She is not in acute distress.    Appearance: She is obese. She is not ill-appearing.  Cardiovascular:     Rate and Rhythm: Normal rate and regular rhythm.     Pulses: Normal pulses.     Heart sounds: Normal heart sounds.  Pulmonary:     Effort: Pulmonary effort is normal.     Breath sounds: Normal breath sounds.  Abdominal:     General: Bowel sounds are normal.     Palpations: Abdomen is soft.  Musculoskeletal:     Comments: Bilateral pedal edema-pitting.  Neurological:     Mental Status: She is alert.      UC Treatments / Results  Labs (all labs ordered are listed, but only abnormal results are displayed) Labs Reviewed - No data to display  EKG   Radiology No results found.  Procedures Procedures (including critical care time)  Medications Ordered in UC Medications - No data to display  Initial Impression / Assessment and Plan / UC Course  I have reviewed the triage vital signs and the nursing notes.  Pertinent labs & imaging results that were available during my care of the patient were reviewed by me and considered in my medical decision making (see chart for details).     1.  Bilateral lower extremity swelling: Concern is for fluid retention. Patient describes left calf cramping which is concerning for possible deep vein thrombosis Patient is advised to go to the emergency department for further evaluation and management Return precautions given. Previous records from a unique source was reviewed during this encounter. Final Clinical Impressions(s) / UC Diagnoses   Final diagnoses:  Pedal edema  Shortness of breath     Discharge Instructions      Go to the emergency department for further evaluation.  I am concerned that the swelling in  the legs may be related to fluid overload.  You may also need evaluation for blood clot.   ED Prescriptions   None    PDMP not reviewed this encounter.   Merrilee Jansky, MD 03/25/23 1024    Merrilee Jansky, MD  03/25/23 1025  

## 2023-03-25 NOTE — ED Provider Notes (Signed)
[name] EMERGENCY DEPARTMENT AT [facility] Provider Note   CSN: [id number] Arrival date & time: 03/25/23  4098     History  No chief complaint on file.   Carrie [name] is a 62 y.o. female.   62 year old female with prior medical history as detailed below presents for evaluation.  Patient complains of worsening bilateral lower extremity edema.  Patient reports that has gradually worsened over the last 1 to 2 weeks.  Patient with history of submassive PE requiring intra-arterial lytic therapy.  Patient was advised at time of that admission that she would be anticoagulated for the rest of her life.  She reports that she stopped taking her Xarelto approximately 6 months ago.  She reports that in addition to having this lower extremity edema she is having some mild intermittent shortness of breath.  She denies chest pain.  She denies fever.     The history is provided by the patient and medical records.       Home Medications Prior to Admission medications   Medication Sig Start Date End Date Taking? Authorizing Provider  albuterol (PROVENTIL) (2.5 MG/3ML) 0.083% nebulizer solution Take 3 mLs (2.5 mg total) by nebulization every 4 (four) hours as needed for wheezing or shortness of breath. 11/12/22   Banister, Janace Aris, MD  albuterol (VENTOLIN HFA) 108 (90 Base) MCG/ACT inhaler Inhale 2 puffs into the lungs every 6 (six) hours as needed for wheezing or shortness of breath. 11/06/22   Debby Freiberg, NP  aspirin EC 81 MG tablet Take 81 mg by mouth daily. Swallow whole.    [provider]      Allergies    Iodinated contrast media, Ketamine, Gluten meal, Lactose intolerance (gi), Other, Wheat, and Cephalexin    Review of Systems   Review of Systems  All other systems reviewed and are negative.   Physical Exam Updated Vital Signs BP 121/71 (BP Location: Left Arm)   Pulse 60   Temp 97.8 F (36.6 C) (Oral)   Resp 19   SpO2 99%  Physical  Exam Vitals and nursing note reviewed.  Constitutional:      General: She is not in acute distress.    Appearance: Normal appearance. She is well-developed.  HENT:     Head: Normocephalic and atraumatic.  Eyes:     Conjunctiva/sclera: Conjunctivae normal.     Pupils: Pupils are equal, round, and reactive to light.  Cardiovascular:     Rate and Rhythm: Normal rate and regular rhythm.     Heart sounds: Normal heart sounds.  Pulmonary:     Effort: Pulmonary effort is normal. No respiratory distress.     Breath sounds: Normal breath sounds.  Abdominal:     General: There is no distension.     Palpations: Abdomen is soft.     Tenderness: There is no abdominal tenderness.  Musculoskeletal:        General: No deformity. Normal range of motion.     Cervical back: Normal range of motion and neck supple.     Right lower leg: Edema present.     Left lower leg: Edema present.     Comments: 3+ bilateral pitting edema to both lower extremities.  Skin:    General: Skin is warm and dry.  Neurological:     General: No focal deficit present.     Mental Status: She is alert and oriented to person, place, and time.     ED Results / Procedures / Treatments  Labs (all labs ordered are listed, but only abnormal results are displayed) Labs Reviewed  CBC WITH DIFFERENTIAL/PLATELET - Abnormal; Notable for the following components:      Result Value   Hemoglobin 11.6 (*)    All other components within normal limits  D-DIMER, QUANTITATIVE - Abnormal; Notable for the following components:   D-Dimer, Quant 3.26 (*)    All other components within normal limits  COMPREHENSIVE METABOLIC PANEL - Abnormal; Notable for the following components:   Chloride 112 (*)    CO2 20 (*)    Calcium 8.2 (*)    Total Protein 5.2 (*)    Albumin 2.9 (*)    All other components within normal limits  I-STAT CHEM 8, ED - Abnormal; Notable for the following components:   Chloride 112 (*)    Calcium, Ion 1.04 (*)     TCO2 21 (*)    Hemoglobin 11.9 (*)    HCT 35.0 (*)    All other components within normal limits  PROTIME-INR  BRAIN NATRIURETIC PEPTIDE  URINALYSIS, ROUTINE W REFLEX MICROSCOPIC  TROPONIN I (HIGH SENSITIVITY)  TROPONIN I (HIGH SENSITIVITY)    EKG EKG Interpretation  Date/Time:  Monday March 25 2023 10:13:53 EDT Ventricular Rate:  91 PR Interval:  149 QRS Duration: 80 QT Interval:  336 QTC Calculation: 414 R Axis:   33 Text Interpretation: Sinus rhythm Probable left atrial enlargement Low voltage, precordial leads Confirmed by Carrie [name] [phone]) on 03/25/2023 10:15:32 AM  Radiology VAS [place] LOWER EXTREMITY VENOUS (DVT) (7a-7p)  Result Date: 03/25/2023  Lower Venous DVT Study Patient Name:  Carrie [name]  Date of Exam:   03/25/2023 Medical Rec #: [id number]      Accession #:    [id number] Date of Birth: 1961-05-21      Patient Gender: F Patient Age:   68 years Exam Location:  [facility] Procedure:      VAS [place] LOWER EXTREMITY VENOUS (DVT) Referring Phys: Theron Arista Rinnah Peppel --------------------------------------------------------------------------------  Indications: Swelling, and left focal calf pain when walking.  Risk Factors: DVT right peroneal vein 01/27/22. Limitations: Body habitus and pitting edema. Comparison Study: Prior negative bilateral LEV done 06/17/22 Performing Technologist: Sherren Kerns RVS  Examination Guidelines: A complete evaluation includes B-mode imaging, spectral Doppler, color Doppler, and power Doppler as needed of all accessible portions of each vessel. Bilateral testing is considered an integral part of a complete examination. Limited examinations for reoccurring indications may be performed as noted. The reflux portion of the exam is performed with the patient in reverse Trendelenburg.  +---------+---------------+---------+-----------+----------+--------------+ RIGHT    CompressibilityPhasicitySpontaneityPropertiesThrombus Aging  +---------+---------------+---------+-----------+----------+--------------+ CFV      Full           Yes      Yes                                 +---------+---------------+---------+-----------+----------+--------------+ SFJ      Full                                                        +---------+---------------+---------+-----------+----------+--------------+ FV Prox  Full                                                        +---------+---------------+---------+-----------+----------+--------------+  FV Mid   Full           Yes      Yes                                 +---------+---------------+---------+-----------+----------+--------------+ FV DistalFull                                                        +---------+---------------+---------+-----------+----------+--------------+ PFV      Full                                                        +---------+---------------+---------+-----------+----------+--------------+ POP      Full           Yes      Yes                                 +---------+---------------+---------+-----------+----------+--------------+ PTV      Full                                                        +---------+---------------+---------+-----------+----------+--------------+ PERO     Full                                                        +---------+---------------+---------+-----------+----------+--------------+   +---------+---------------+---------+-----------+----------+--------------+ LEFT     CompressibilityPhasicitySpontaneityPropertiesThrombus Aging +---------+---------------+---------+-----------+----------+--------------+ CFV      Full           Yes      Yes                                 +---------+---------------+---------+-----------+----------+--------------+ SFJ      Full                                                         +---------+---------------+---------+-----------+----------+--------------+ FV Prox  Full                                                        +---------+---------------+---------+-----------+----------+--------------+ FV Mid   Full           Yes      Yes                                 +---------+---------------+---------+-----------+----------+--------------+  FV DistalFull                                                        +---------+---------------+---------+-----------+----------+--------------+ PFV      Full                                                        +---------+---------------+---------+-----------+----------+--------------+ POP      Full           Yes      Yes                                 +---------+---------------+---------+-----------+----------+--------------+ PTV      Full                                                        +---------+---------------+---------+-----------+----------+--------------+ PERO     Full                                                        +---------+---------------+---------+-----------+----------+--------------+    Summary: BILATERAL: - No evidence of deep vein thrombosis seen in the lower extremities, bilaterally. -No evidence of popliteal cyst, bilaterally.   *See table(s) above for measurements and observations.    Preliminary    DG Chest Port 1 View  Result Date: 03/25/2023 CLINICAL DATA:  Shortness of breath. EXAM: PORTABLE CHEST 1 VIEW COMPARISON:  11/06/2022. FINDINGS: Clear lungs. Normal heart size and mediastinal contours. No pleural effusion or pneumothorax. Visualized bones and upper abdomen are unremarkable. IMPRESSION: No evidence of acute cardiopulmonary disease. Electronically Signed   By: Orvan Falconer M.D.   On: 03/25/2023 10:49    Procedures Procedures    Medications Ordered in ED Medications  methylPREDNISolone sodium succinate (SOLU-MEDROL) 40 mg/mL injection 40 mg (has no  administration in time range)  diphenhydrAMINE (BENADRYL) capsule 50 mg (has no administration in time range)    Or  diphenhydrAMINE (BENADRYL) injection 50 mg (has no administration in time range)    ED Course/ Medical Decision Making/ A&P                             Medical Decision Making Amount and/or Complexity of Data Reviewed Labs: ordered. Radiology: ordered.  Risk Prescription drug management.    Medical Screen Complete  This patient presented to the ED with complaint of lower extremity edema, shortness of breath  This complaint involves an extensive number of treatment options. The initial differential diagnosis includes, but is not limited to, metabolic abnormality, PE, renal failure, etc.  This presentation is: Acute, Chronic, Self-Limited, Previously Undiagnosed, Uncertain Prognosis, Complicated, Systemic Symptoms, and Threat to Life/Bodily Function  Patient is presenting with complaint of bilateral lower extremity edema.  Patient complains of associated shortness of breath with same.  Patient with history of submassive PE and April 2023.  This required admission and tPA infusion.  Patient was advised at that time that she would need to be on anticoagulation for life.  She reports today that she stopped taking Xarelto at least 6 months ago.  Patient with significant bilateral lower extremity edema today.  Patient with negative DVT study today.  Patient with elevated D-dimer today at 3.26.  Patient does report history of allergic reaction to IV dye.  Extensively discussed with patient options for further evaluating possible pulmonary embolus today.  Patient is agreeable with a steroid prep and CT angiogram this afternoon to exclude possibility of PE.  All risks and benefits discussed extensively with the patient at bedside.  Steroid prep initiated at approximately 1 PM this afternoon.  Patient will not be due for CT imaging until approximately 5 PM this  afternoon.  If patient is negative for PE she is likely appropriate for discharge home.  She could benefit from a short course of diuretic to help reduce some of her lower extremity edema.  Additionally, patient is advised that Xarelto should be continued given history of submassive PE last year.  If CT imaging is positive for PE the patient would likely benefit from admission.  Dr. Durwin Nora aware of pending CT angio and need for disposition.    Additional history obtained: External records from outside sources obtained and reviewed including prior ED visits and prior Inpatient records.    Lab Tests:  I ordered and personally interpreted labs.    Imaging Studies ordered:  I ordered imaging studies including cxr, CTA PE    I agree with the radiologist interpretation.   Cardiac Monitoring:  The patient was maintained on a cardiac monitor.  I personally viewed and interpreted the cardiac monitor which showed an underlying rhythm of: NSR   Medicines ordered:  I ordered medication including contrast allergy steroid prep  Reevaluation of the patient after these medicines showed that the patient: improved    Problem List / ED Course:  Lower extremity edema, dyspnea   Reevaluation:  After the interventions noted above, I reevaluated the patient and found that they have: stayed the same   Disposition:  After consideration of the diagnostic results and the patients response to treatment, I feel that the patent would benefit from completion of ED evaluation   CRITICAL CARE Performed by: Wynetta Fines   Total critical care time: 30 minutes  Critical care time was exclusive of separately billable procedures and treating other patients.  Critical care was necessary to treat or prevent imminent or life-threatening deterioration.  Critical care was time spent personally by me on the following activities: development of treatment plan with patient and/or surrogate as well  as nursing, discussions with consultants, evaluation of patient's response to treatment, examination of patient, obtaining history from patient or surrogate, ordering and performing treatments and interventions, ordering and review of laboratory studies, ordering and review of radiographic studies, pulse oximetry and re-evaluation of patient's condition.          Final Clinical Impression(s) / ED Diagnoses Final diagnoses:  Lower extremity edema  Dyspnea, unspecified type    Rx / DC Orders ED Discharge Orders     None         Wynetta Fines, MD 03/25/23 [phone]

## 2023-03-25 NOTE — Progress Notes (Signed)
ANTICOAGULATION CONSULT NOTE - Initial Consult  Pharmacy Consult for heparin  Indication: pulmonary embolus  Allergies  Allergen Reactions   Iodinated Contrast Media Anaphylaxis   Ketamine Anaphylaxis   Gluten Meal Nausea Only    Makes stomach hurt also   Lactose Intolerance (Gi) Diarrhea and Nausea Only    Makes stomach hurt also   Other Swelling and Rash    Radioactive dyes, heavy metals like nickel   Wheat Diarrhea and Nausea Only    Makes stomach hurt also   Cephalexin Itching    Patient Measurements: Height: 5\' 4"  (162.6 cm) Weight: 95.3 kg (210 lb 1.6 oz) IBW/kg (Calculated) : 54.7 Heparin Dosing Weight: 76.5kg   Vital Signs: Temp: 97.5 F (36.4 C) (06/03 1607) Temp Source: Oral (06/03 1607) BP: 126/77 (06/03 1606) Pulse Rate: 82 (06/03 1606)  Labs: Recent Labs    03/25/23 1008 03/25/23 1034 03/25/23 1306  HGB 11.6* 11.9*  --   HCT 38.2 35.0*  --   PLT 217  --   --   LABPROT 13.8  --   --   INR 1.0  --   --   CREATININE 0.62 0.50  --   TROPONINIHS 3  --  5    Estimated Creatinine Clearance: 81.6 mL/min (by C-G formula based on SCr of 0.5 mg/dL).   Medical History: Past Medical History:  Diagnosis Date   Arthritis    DVT (deep venous thrombosis) (HCC)    High cholesterol    Kidney stones    Ovarian cyst     Assessment: Patient with CC of leg swelling with hx of DVT last year, not currently on anticoagulation. CTA with PE showing right heart strain. HgB 11.9 and PLTS 217.    Goal of Therapy:  Heparin level 0.3-0.7 units/ml Monitor platelets by anticoagulation protocol: Yes   Plan:  Give 4500 units bolus x 1 Start heparin infusion at 1300 units/hr Check anti-Xa level in 6 hours and daily while on heparin Continue to monitor H&H and platelets  Estill Batten, PharmD, BCCCP  03/25/2023,6:29 PM

## 2023-03-25 NOTE — ED Triage Notes (Signed)
Pt came in via POV sent from UC for painful leg swelling & painful cramping intermittently in her Lt calf. A/Ox4, rates pain 7/10, does have some heaviness in her chest making it difficult to breath or ambulate long distances.

## 2023-03-26 ENCOUNTER — Other Ambulatory Visit (HOSPITAL_COMMUNITY): Payer: Self-pay

## 2023-03-26 ENCOUNTER — Observation Stay (HOSPITAL_COMMUNITY): Payer: Self-pay

## 2023-03-26 DIAGNOSIS — I2609 Other pulmonary embolism with acute cor pulmonale: Secondary | ICD-10-CM

## 2023-03-26 DIAGNOSIS — I2699 Other pulmonary embolism without acute cor pulmonale: Secondary | ICD-10-CM

## 2023-03-26 LAB — COMPREHENSIVE METABOLIC PANEL
ALT: 14 U/L (ref 0–44)
AST: 16 U/L (ref 15–41)
Albumin: 2.4 g/dL — ABNORMAL LOW (ref 3.5–5.0)
Alkaline Phosphatase: 35 U/L — ABNORMAL LOW (ref 38–126)
Anion gap: 7 (ref 5–15)
BUN: 11 mg/dL (ref 8–23)
CO2: 20 mmol/L — ABNORMAL LOW (ref 22–32)
Calcium: 7.9 mg/dL — ABNORMAL LOW (ref 8.9–10.3)
Chloride: 112 mmol/L — ABNORMAL HIGH (ref 98–111)
Creatinine, Ser: 0.53 mg/dL (ref 0.44–1.00)
GFR, Estimated: >60 mL/min (ref >60)
Glucose, Bld: 112 mg/dL — ABNORMAL HIGH (ref 70–99)
Potassium: 3.7 mmol/L (ref 3.5–5.1)
Sodium: 139 mmol/L (ref 135–145)
Total Bilirubin: 0.6 mg/dL (ref 0.3–1.2)
Total Protein: 4.4 g/dL — ABNORMAL LOW (ref 6.5–8.1)

## 2023-03-26 LAB — IRON AND TIBC
Iron: 29 ug/dL (ref 28–170)
Saturation Ratios: 6 % — ABNORMAL LOW (ref 10.4–31.8)
TIBC: 461 ug/dL — ABNORMAL HIGH (ref 250–450)
UIBC: 432 ug/dL

## 2023-03-26 LAB — RETICULOCYTES
Immature Retic Fract: 14 % (ref 2.3–15.9)
RBC.: 4.38 MIL/uL (ref 3.87–5.11)
Retic Count, Absolute: 78 10*3/uL (ref 19.0–186.0)
Retic Ct Pct: 1.8 % (ref 0.4–3.1)

## 2023-03-26 LAB — CBC
HCT: 33.6 % — ABNORMAL LOW (ref 36.0–46.0)
Hemoglobin: 10.5 g/dL — ABNORMAL LOW (ref 12.0–15.0)
MCH: 27 pg (ref 26.0–34.0)
MCHC: 31.3 g/dL (ref 30.0–36.0)
MCV: 86.4 fL (ref 80.0–100.0)
Platelets: 189 10*3/uL (ref 150–400)
RBC: 3.89 MIL/uL (ref 3.87–5.11)
RDW: 15.2 % (ref 11.5–15.5)
WBC: 5.8 10*3/uL (ref 4.0–10.5)
nRBC: 0 % (ref 0.0–0.2)

## 2023-03-26 LAB — ECHOCARDIOGRAM COMPLETE
AR max vel: 3.22 cm2
AV Peak grad: 14.4 mmHg
Ao pk vel: 1.9 m/s
Area-P 1/2: 4.07 cm2
Height: 64 in
S' Lateral: 3.2 cm
Weight: 3361.57 oz

## 2023-03-26 LAB — MAGNESIUM
Magnesium: 1.9 mg/dL (ref 1.7–2.4)
Magnesium: 2 mg/dL (ref 1.7–2.4)

## 2023-03-26 LAB — FERRITIN: Ferritin: 7 ng/mL — ABNORMAL LOW (ref 11–307)

## 2023-03-26 LAB — HEPARIN LEVEL (UNFRACTIONATED): Heparin Unfractionated: 0.75 IU/mL — ABNORMAL HIGH (ref 0.30–0.70)

## 2023-03-26 MED ORDER — RIVAROXABAN 20 MG PO TABS: 20.0000 mg | ORAL_TABLET | Freq: Every day | ORAL | Status: DC

## 2023-03-26 MED ORDER — ENOXAPARIN SODIUM 100 MG/ML IJ SOSY
90.0000 mg | PREFILLED_SYRINGE | Freq: Two times a day (BID) | INTRAMUSCULAR | Status: DC
  Filled 2023-03-26: qty 1

## 2023-03-26 MED ORDER — RIVAROXABAN 15 MG PO TABS
15.0000 mg | ORAL_TABLET | Freq: Two times a day (BID) | ORAL | Status: DC
  Administered 2023-03-26 – 2023-03-27 (×2): 15 mg via ORAL
  Filled 2023-03-26 (×2): qty 1

## 2023-03-26 MED ORDER — RIVAROXABAN 15 MG PO TABS: 15.0000 mg | ORAL_TABLET | Freq: Two times a day (BID) | ORAL | Status: DC

## 2023-03-26 MED ORDER — RIVAROXABAN 15 MG PO TABS
15.0000 mg | ORAL_TABLET | ORAL | Status: AC
  Administered 2023-03-26: 15 mg via ORAL
  Filled 2023-03-26: qty 1

## 2023-03-26 NOTE — ED Notes (Signed)
ED TO INPATIENT HANDOFF REPORT  ED Nurse Name and Phone #: [phone] [facility]   S Name/Age/Gender Vara Guardian 62 y.o. female Room/Bed: RESUSC/RESUSC  Code Status   Code Status: Full Code  Home/SNF/Other Home Patient oriented to: self, place, time, and situation Is this baseline? Yes   Triage Complete: Triage complete  Chief Complaint Bilateral pulmonary embolism (HCC) [I26.99]  Triage Note Pt came in via POV sent from UC for painful leg swelling & painful cramping intermittently in her Lt calf. A/Ox4, rates pain 7/10, does have some heaviness in her chest making it difficult to breath or ambulate long distances.   Allergies Allergies  Allergen Reactions   Iodinated Contrast Media Anaphylaxis   Ketamine Anaphylaxis   Gluten Meal Nausea Only    Makes stomach hurt also   Lactose Intolerance (Gi) Diarrhea and Nausea Only    Makes stomach hurt also   Other Swelling and Rash    Radioactive dyes, heavy metals like nickel   Wheat Diarrhea and Nausea Only    Makes stomach hurt also   Cephalexin Itching    Level of Care/Admitting Diagnosis ED Disposition     ED Disposition  Admit   Condition  --   Comment  Hospital Area: [facility] [[id number]]  Level of Care: Progressive [102]  Admit to Progressive based on following criteria: MULTISYSTEM THREATS such as stable sepsis, metabolic/electrolyte imbalance with or without encephalopathy that is responding to early treatment.  May place patient in observation at [facility] or [facility] if equivalent level of care is available:: No  Covid Evaluation: Asymptomatic - no recent exposure (last 10 days) testing not required  Diagnosis: Bilateral pulmonary embolism [facility]) [[id number]]  Admitting Physician: Angie Fava [[id number]]  Attending Physician: Angie Fava [[id number]]          B Medical/Surgery History Past Medical History:  Diagnosis Date   Arthritis    DVT (deep venous thrombosis) (HCC)     High cholesterol    Kidney stones    Ovarian cyst    Pulmonary embolism [facility])    Past Surgical History:  Procedure Laterality Date   IR ANGIOGRAM PULMONARY BILATERAL SELECTIVE  01/28/2022   IR ANGIOGRAM SELECTIVE EACH ADDITIONAL VESSEL  01/28/2022   IR INFUSION THROMBOL ARTERIAL INITIAL (MS)  01/28/2022   IR INFUSION THROMBOL ARTERIAL INITIAL (MS)  01/28/2022   IR THROMB F/U EVAL ART/VEN FINAL DAY (MS)  01/29/2022   IR US GUIDE VASC ACCESS RIGHT  01/28/2022   IR US GUIDE VASC ACCESS RIGHT  01/28/2022   OVARIAN CYST REMOVAL       A IV Location/Drains/Wounds Patient Lines/Drains/Airways Status     Active Line/Drains/Airways     Name Placement date Placement time Site Days   Peripheral IV 03/25/23 20 G Posterior;Left Hand 03/25/23  1038  Hand  1   Peripheral IV 03/25/23 20 G Left Antecubital 03/25/23  1312  Antecubital  1   Wound / Incision (Open or Dehisced) 01/28/22 Puncture Neck Right Venous access for pulmonary catheters 01/28/22  2143  Neck  422            Intake/Output Last 24 hours No intake or output data in the 24 hours ending 03/26/23 0446  Labs/Imaging Results for orders placed or performed during the hospital encounter of 03/25/23 (from the past 48 hour(s))  CBC with Differential     Status: Abnormal   Collection Time: 03/25/23 10:08 AM  Result Value Ref Range   WBC 5.4 4.0 -  10.5 K/uL   RBC 4.40 3.87 - 5.11 MIL/uL   Hemoglobin 11.6 (L) 12.0 - 15.0 g/dL   HCT 16.1 09.6 - 04.5 %   MCV 86.8 80.0 - 100.0 fL   MCH 26.4 26.0 - 34.0 pg   MCHC 30.4 30.0 - 36.0 g/dL   RDW 40.9 81.1 - 91.4 %   Platelets 217 150 - 400 K/uL   nRBC 0.0 0.0 - 0.2 %   Neutrophils Relative % 68 %   Neutro Abs 3.7 1.7 - 7.7 K/uL   Lymphocytes Relative 21 %   Lymphs Abs 1.2 0.7 - 4.0 K/uL   Monocytes Relative 8 %   Monocytes Absolute 0.4 0.1 - 1.0 K/uL   Eosinophils Relative 2 %   Eosinophils Absolute 0.1 0.0 - 0.5 K/uL   Basophils Relative 1 %   Basophils Absolute 0.0 0.0 - 0.1 K/uL    Immature Granulocytes 0 %   Abs Immature Granulocytes 0.01 0.00 - 0.07 K/uL    Comment: Performed at [facility], [address], [place], [place] [address]  D-dimer, quantitative     Status: Abnormal   Collection Time: 03/25/23 10:08 AM  Result Value Ref Range   D-Dimer, Quant 3.26 (H) 0.00 - 0.50 ug/mL-FEU    Comment: (NOTE) At the manufacturer cut-off value of 0.5 g/mL FEU, this assay has a negative predictive value of 95-100%.This assay is intended for use in conjunction with a clinical pretest probability (PTP) assessment model to exclude pulmonary embolism (PE) and deep venous thrombosis (DVT) in outpatients suspected of PE or DVT. Results should be correlated with clinical presentation. Performed at [facility], [address], [place], [place] [address]   Protime-INR     Status: None   Collection Time: 03/25/23 10:08 AM  Result Value Ref Range   Prothrombin Time 13.8 11.4 - 15.2 seconds   INR 1.0 0.8 - 1.2    Comment: (NOTE) INR goal varies based on device and disease states. Performed at [facility], [address], [place], [place] [address]   Troponin I (High Sensitivity)     Status: None   Collection Time: 03/25/23 10:08 AM  Result Value Ref Range   Troponin I (High Sensitivity) 3 <18 ng/L    Comment: (NOTE) Elevated high sensitivity troponin I (hsTnI) values and significant  changes across serial measurements may suggest ACS but many other  chronic and acute conditions are known to elevate hsTnI results.  Refer to the "Links" section for chest pain algorithms and additional  guidance. Performed at [facility], [address], [place], [place] [address]   Brain natriuretic peptide     Status: None   Collection Time: 03/25/23 10:08 AM  Result Value Ref Range   B Natriuretic Peptide 27.4 0.0 - 100.0 pg/mL    Comment: Performed at [facility], [address] [place], [place] [address]  Comprehensive metabolic panel     Status:  Abnormal   Collection Time: 03/25/23 10:08 AM  Result Value Ref Range   Sodium 142 135 - 145 mmol/L   Potassium 3.8 3.5 - 5.1 mmol/L   Chloride 112 (H) 98 - 111 mmol/L   CO2 20 (L) 22 - 32 mmol/L   Glucose, Bld 89 70 - 99 mg/dL    Comment: Glucose reference range applies only to samples taken after fasting for at least 8 hours.   BUN 12 8 - 23 mg/dL   Creatinine, Ser 1.32 0.44 -  1.00 mg/dL   Calcium 8.2 (L) 8.9 - 10.3 mg/dL   Total Protein 5.2 (L) 6.5 - 8.1 g/dL   Albumin 2.9 (L) 3.5 - 5.0 g/dL   AST 18 15 - 41 U/L   ALT 16 0 - 44 U/L   Alkaline Phosphatase 45 38 - 126 U/L   Total Bilirubin 0.4 0.3 - 1.2 mg/dL   GFR, Estimated >16 >10 mL/min    Comment: (NOTE) Calculated using the CKD-EPI Creatinine Equation (2021)    Anion gap 10 5 - 15    Comment: Performed at [facility], [address], [place], [place] [address]  I-stat chem 8, ED     Status: Abnormal   Collection Time: 03/25/23 10:34 AM  Result Value Ref Range   Sodium 143 135 - 145 mmol/L   Potassium 3.8 3.5 - 5.1 mmol/L   Chloride 112 (H) 98 - 111 mmol/L   BUN 13 8 - 23 mg/dL   Creatinine, Ser 4.09 0.44 - 1.00 mg/dL   Glucose, Bld 87 70 - 99 mg/dL    Comment: Glucose reference range applies only to samples taken after fasting for at least 8 hours.   Calcium, Ion 1.04 (L) 1.15 - 1.40 mmol/L   TCO2 21 (L) 22 - 32 mmol/L   Hemoglobin 11.9 (L) 12.0 - 15.0 g/dL   HCT 81.1 (L) 91.4 - 78.2 %  Urinalysis, Routine w reflex microscopic -Urine, Clean Catch     Status: None   Collection Time: 03/25/23 11:00 AM  Result Value Ref Range   Color, Urine YELLOW YELLOW   APPearance CLEAR CLEAR   Specific Gravity, Urine 1.016 1.005 - 1.030   pH 6.0 5.0 - 8.0   Glucose, UA NEGATIVE NEGATIVE mg/dL   Hgb urine dipstick NEGATIVE NEGATIVE   Bilirubin Urine NEGATIVE NEGATIVE   Ketones, ur NEGATIVE NEGATIVE mg/dL   Protein, ur NEGATIVE NEGATIVE mg/dL   Nitrite NEGATIVE NEGATIVE   Leukocytes,Ua NEGATIVE NEGATIVE    Comment:  Performed at [facility], [address] [place], [place] [address]  Troponin I (High Sensitivity)     Status: None   Collection Time: 03/25/23  1:06 PM  Result Value Ref Range   Troponin I (High Sensitivity) 5 <18 ng/L    Comment: (NOTE) Elevated high sensitivity troponin I (hsTnI) values and significant  changes across serial measurements may suggest ACS but many other  chronic and acute conditions are known to elevate hsTnI results.  Refer to the "Links" section for chest pain algorithms and additional  guidance. Performed at [facility], [address], [place], [place] [address]   Type and screen [facility]     Status: None   Collection Time: 03/25/23 10:38 PM  Result Value Ref Range   ABO/RH(D) A POS    Antibody Screen NEG    Sample Expiration      03/28/2023,2359 Performed at [facility], [address] [place], [place] [address]   Heparin level (unfractionated)     Status: Abnormal   Collection Time: 03/26/23  3:23 AM  Result Value Ref Range   Heparin Unfractionated 0.75 (H) 0.30 - 0.70 IU/mL    Comment: (NOTE) The clinical reportable range upper limit is being lowered to >1.10 to align with the FDA approved guidance for the current laboratory assay.  If heparin results are below expected values, and patient dosage has  been confirmed, suggest follow up testing of antithrombin III levels. Performed at  [facility], [address], [place], [place] [address]   CBC     Status: Abnormal   Collection Time: 03/26/23  3:23 AM  Result Value Ref Range   WBC 5.8 4.0 - 10.5 K/uL   RBC 3.89 3.87 - 5.11 MIL/uL   Hemoglobin 10.5 (L) 12.0 - 15.0 g/dL   HCT 11.9 (L) 14.7 - 82.9 %   MCV 86.4 80.0 - 100.0 fL   MCH 27.0 26.0 - 34.0 pg   MCHC 31.3 30.0 - 36.0 g/dL   RDW 56.2 13.0 - 86.5 %   Platelets 189 150 - 400 K/uL   nRBC 0.0 0.0 - 0.2 %    Comment: Performed at [facility], [address], [place], [place] [address]   Comprehensive metabolic panel     Status: Abnormal   Collection Time: 03/26/23  3:23 AM  Result Value Ref Range   Sodium 139 135 - 145 mmol/L   Potassium 3.7 3.5 - 5.1 mmol/L   Chloride 112 (H) 98 - 111 mmol/L   CO2 20 (L) 22 - 32 mmol/L   Glucose, Bld 112 (H) 70 - 99 mg/dL    Comment: Glucose reference range applies only to samples taken after fasting for at least 8 hours.   BUN 11 8 - 23 mg/dL   Creatinine, Ser 6.29 0.44 - 1.00 mg/dL   Calcium 7.9 (L) 8.9 - 10.3 mg/dL   Total Protein 4.4 (L) 6.5 - 8.1 g/dL   Albumin 2.4 (L) 3.5 - 5.0 g/dL   AST 16 15 - 41 U/L   ALT 14 0 - 44 U/L   Alkaline Phosphatase 35 (L) 38 - 126 U/L   Total Bilirubin 0.6 0.3 - 1.2 mg/dL   GFR, Estimated >52 >84 mL/min    Comment: (NOTE) Calculated using the CKD-EPI Creatinine Equation (2021)    Anion gap 7 5 - 15    Comment: Performed at [facility], [address], [place], [place] [address]  Magnesium     Status: None   Collection Time: 03/26/23  3:23 AM  Result Value Ref Range   Magnesium 1.9 1.7 - 2.4 mg/dL    Comment: Performed at [facility], [address], [place], [place] [address]   CT Angio Chest PE W and/or Wo Contrast  Result Date: 03/25/2023 CLINICAL DATA:  Positive D-dimer. EXAM: CT ANGIOGRAPHY CHEST WITH CONTRAST TECHNIQUE: Multidetector CT imaging of the chest was performed using the standard protocol during bolus administration of intravenous contrast. Multiplanar CT image reconstructions and MIPs were obtained to evaluate the vascular anatomy. RADIATION DOSE REDUCTION: This exam was performed according to the departmental dose-optimization program which includes automated exposure control, adjustment of the mA and/or kV according to patient size and/or use of iterative reconstruction technique. CONTRAST:  75mL OMNIPAQUE IOHEXOL 350 MG/ML SOLN COMPARISON:  CT PE protocol 01/28/2022 FINDINGS: Cardiovascular: There is adequate opacification of the pulmonary arteries to the  segmental level. Pulmonary emboli are seen within the within the distal left main pulmonary artery extending into segmental and subsegmental branches of the left lower lobe and segmental branches of the left upper lobe. There also pulmonary emboli seen within segmental and subsegmental branches of the right lower lobe right middle lobe and right upper lobe. Heart and aorta are normal in size. There is no pericardial effusion. Mediastinum/Nodes: No enlarged mediastinal, hilar, or axillary lymph nodes. Thyroid gland, trachea, and esophagus demonstrate no significant findings. Lungs/Pleura: Lungs are clear. No pleural  effusion or pneumothorax. Upper Abdomen: No acute findings. There are hypodensities in the liver which are too small to characterize favored as cysts measuring up to 11 mm, unchanged. Musculoskeletal: No chest wall abnormality. No acute or significant osseous findings. Review of the MIP images confirms the above findings. IMPRESSION: 1. Bilateral pulmonary emboli, moderate burden. Positive for acute PE with CT evidence of right heart strain (RV/LV Ratio = 1.0) consistent with at least submassive (intermediate risk) PE. The presence of right heart strain has been associated with an increased risk of morbidity and mortality. These results were called by telephone at the time of interpretation on 03/25/2023 at 6:19 pm to provider Dr. Durwin Nora, who verbally acknowledged these results. Electronically Signed   By: Darliss Cheney M.D.   On: 03/25/2023 18:19   VAS [place] LOWER EXTREMITY VENOUS (DVT) (7a-7p)  Result Date: 03/25/2023  Lower Venous DVT Study Patient Name:  MONZERAT MICHAUX  Date of Exam:   03/25/2023 Medical Rec #: [id number]      Accession #:    [id number] Date of Birth: June 23, 1961      Patient Gender: F Patient Age:   25 years Exam Location:  [facility] Procedure:      VAS [place] LOWER EXTREMITY VENOUS (DVT) Referring Phys: Theron Arista MESSICK  --------------------------------------------------------------------------------  Indications: Swelling, and left focal calf pain when walking.  Risk Factors: DVT right peroneal vein 01/27/22. Limitations: Body habitus and pitting edema. Comparison Study: Prior negative bilateral LEV done 06/17/22 Performing Technologist: Sherren Kerns RVS  Examination Guidelines: A complete evaluation includes B-mode imaging, spectral Doppler, color Doppler, and power Doppler as needed of all accessible portions of each vessel. Bilateral testing is considered an integral part of a complete examination. Limited examinations for reoccurring indications may be performed as noted. The reflux portion of the exam is performed with the patient in reverse Trendelenburg.  +---------+---------------+---------+-----------+----------+--------------+ RIGHT    CompressibilityPhasicitySpontaneityPropertiesThrombus Aging +---------+---------------+---------+-----------+----------+--------------+ CFV      Full           Yes      Yes                                 +---------+---------------+---------+-----------+----------+--------------+ SFJ      Full                                                        +---------+---------------+---------+-----------+----------+--------------+ FV Prox  Full                                                        +---------+---------------+---------+-----------+----------+--------------+ FV Mid   Full           Yes      Yes                                 +---------+---------------+---------+-----------+----------+--------------+ FV DistalFull                                                        +---------+---------------+---------+-----------+----------+--------------+  PFV      Full                                                        +---------+---------------+---------+-----------+----------+--------------+ POP      Full           Yes      Yes                                  +---------+---------------+---------+-----------+----------+--------------+ PTV      Full                                                        +---------+---------------+---------+-----------+----------+--------------+ PERO     Full                                                        +---------+---------------+---------+-----------+----------+--------------+   +---------+---------------+---------+-----------+----------+--------------+ LEFT     CompressibilityPhasicitySpontaneityPropertiesThrombus Aging +---------+---------------+---------+-----------+----------+--------------+ CFV      Full           Yes      Yes                                 +---------+---------------+---------+-----------+----------+--------------+ SFJ      Full                                                        +---------+---------------+---------+-----------+----------+--------------+ FV Prox  Full                                                        +---------+---------------+---------+-----------+----------+--------------+ FV Mid   Full           Yes      Yes                                 +---------+---------------+---------+-----------+----------+--------------+ FV DistalFull                                                        +---------+---------------+---------+-----------+----------+--------------+ PFV      Full                                                        +---------+---------------+---------+-----------+----------+--------------+  POP      Full           Yes      Yes                                 +---------+---------------+---------+-----------+----------+--------------+ PTV      Full                                                        +---------+---------------+---------+-----------+----------+--------------+ PERO     Full                                                         +---------+---------------+---------+-----------+----------+--------------+     Summary: BILATERAL: - No evidence of deep vein thrombosis seen in the lower extremities, bilaterally. -No evidence of popliteal cyst, bilaterally.   *See table(s) above for measurements and observations. Electronically signed by Lemar Livings MD on 03/25/2023 at 4:39:58 PM.    Final    DG Chest Port 1 View  Result Date: 03/25/2023 CLINICAL DATA:  Shortness of breath. EXAM: PORTABLE CHEST 1 VIEW COMPARISON:  11/06/2022. FINDINGS: Clear lungs. Normal heart size and mediastinal contours. No pleural effusion or pneumothorax. Visualized bones and upper abdomen are unremarkable. IMPRESSION: No evidence of acute cardiopulmonary disease. Electronically Signed   By: Orvan Falconer M.D.   On: 03/25/2023 10:49    Pending Labs Unresulted Labs (From admission, onward)     Start     Ordered   03/26/23 1000  Heparin level (unfractionated)  Once-Timed,   TIMED        03/26/23 0404   03/26/23 0500  Heparin level (unfractionated)  Daily,   R     See Hyperspace for full Linked Orders Report.   03/25/23 1833   03/26/23 0500  CBC  Daily,   R     See Hyperspace for full Linked Orders Report.   03/25/23 1833   03/25/23 2134  Ferritin  Add-on,   AD        03/25/23 2133   03/25/23 2134  Iron and TIBC  Add-on,   AD        03/25/23 2133   03/25/23 2134  Reticulocytes  Add-on,   AD        03/25/23 2133   03/25/23 1927  Magnesium  Add-on,   AD        03/25/23 1926            Vitals/Pain Today's Vitals   03/26/23 0252 03/26/23 0300 03/26/23 0317 03/26/23 0331  BP:  103/65    Pulse:  65    Resp:  14    Temp: 97.8 F (36.6 C)     TempSrc: Tympanic     SpO2:  95%    Weight:      Height:      PainSc:   Asleep 2     Isolation Precautions No active isolations  Medications Medications  heparin ADULT infusion 100 units/mL (25000 units/261mL) (1,300 Units/hr Intravenous Rate/Dose Verify 03/26/23 0401)  acetaminophen (TYLENOL)  tablet 650 mg (has no administration in time range)  Or  acetaminophen (TYLENOL) suppository 650 mg (has no administration in time range)  melatonin tablet 3 mg (has no administration in time range)  ondansetron (ZOFRAN) injection 4 mg (has no administration in time range)  0.9 %  sodium chloride infusion ( Intravenous New Bag/Given 03/25/23 2140)  methylPREDNISolone sodium succinate (SOLU-MEDROL) 40 mg/mL injection 40 mg (40 mg Intravenous Given 03/25/23 1318)  diphenhydrAMINE (BENADRYL) capsule 50 mg ( Oral See Alternative 03/25/23 1635)    Or  diphenhydrAMINE (BENADRYL) injection 50 mg (50 mg Intravenous Given 03/25/23 1635)  iohexol (OMNIPAQUE) 350 MG/ML injection 75 mL (75 mLs Intravenous Contrast Given 03/25/23 1754)  heparin bolus via infusion 4,500 Units (4,500 Units Intravenous Bolus from Bag 03/25/23 1919)    Mobility walks     Focused Assessments Cardiac Assessment Handoff:  Cardiac Rhythm: Normal sinus rhythm Lab Results  Component Value Date   TROPONINI <0.01        NO INDICATION OF MYOCARDIAL INJURY. 01/17/2010   Lab Results  Component Value Date   DDIMER 3.26 (H) 03/25/2023   Does the Patient currently have chest pain? No    R Recommendations: See Admitting Provider Note  Report given to:   Additional Notes: Pt is A&O x4, uses bedside commode w/o assist . Pt is currently on heparin drip at 1,300 units/hr, pt has 2 working IV. Pt is obese, generalized edema, bilateral legs +2 pitting edema. Denies CP or SOB.

## 2023-03-26 NOTE — Progress Notes (Signed)
PROGRESS NOTE                                                                                                                                                                                                             Patient Demographics:    Carrie [name], is a 62 y.o. female, DOB - 04/06/1961, ZOX:[id number]  Outpatient Primary MD for the patient is Pcp, No    LOS - 0  Admit date - 03/25/2023    Chief Complaint  Patient presents with   Leg Swelling       Brief Narrative (HPI from H&P)    62 y.o. female with medical history significant for previous pulmonary embolism/DVT, who is admitted to [facility] on 03/25/2023 with acute bilateral pulmonary emboli with suggestion of right heart strain after presenting from home to [facility] ED complaining of shortness of breath.  Her workup was consistent with submassive PE of note she has had PE in the past which required tPA and 6 months of Xarelto, she also has family history of hypercoagulable state with multiple family members on mom side having DVT and PEs.   Subjective:    Carrie [name] today has, No headache, No chest pain, No abdominal pain - No Nausea, No new weakness tingling or numbness, No SOB.   Assessment  & Plan :    Acute bilateral submassive PE with some evidence of cor pulmonale on CTA.  Thankfully she is hemodynamically stable on heparin drip which will be continued, echo pending, this is her second episode of large PE with family history of hypercoagulable state, if stable will start her on Xarelto from 03/27/2023 as she has tolerated it well in the past during her previous episode of PE.  Most likely she will be anticoagulated lifelong.  Anemia of chronic disease.  Stable.  Monitor.      Condition - Fair  Family Communication  :  None  Code Status :  Full  Consults  :  None  PUD Prophylaxis :    Procedures  :     CTA.  Submassive PE.    Lower  extremity venous duplex.  No DVT.    Echocardiogram.      Disposition Plan  :    Status is: Observation  DVT Prophylaxis  :    Heparin   Lab Results  Component Value Date   PLT 189 03/26/2023    Diet :  Diet Order             Diet regular Room service appropriate? Yes; Fluid consistency: Thin  Diet effective now                    Inpatient Medications  Scheduled Meds: Continuous Infusions:  heparin 1,300 Units/hr (03/26/23 0401)   PRN Meds:.acetaminophen **OR** acetaminophen, melatonin, ondansetron (ZOFRAN) IV  Antibiotics  :    Anti-infectives (From admission, onward)    None         Objective:   Vitals:   03/26/23 0500 03/26/23 0505 03/26/23 0530 03/26/23 0752  BP: 122/76  125/77 (!) 156/86  Pulse: 80  75 86  Resp: (!) 24  20 19   Temp:  (!) 79.9 F (26.6 C) 97.8 F (36.6 C) 99.2 F (37.3 C)  TempSrc:  Temporal Oral Oral  SpO2: 99%  98% 96%  Weight:      Height:        Wt Readings from Last 3 Encounters:  03/25/23 95.3 kg  01/18/23 95.3 kg  01/27/22 93 kg    No intake or output data in the 24 hours ending 03/26/23 1017   Physical Exam  Awake Alert, No new F.N deficits, Normal affect [place].AT,PERRAL Supple Neck, No JVD,   Symmetrical Chest wall movement, Good air movement bilaterally, CTAB RRR,No Gallops,Rubs or new Murmurs,  +ve B.Sounds, Abd Soft, No tenderness,   No Cyanosis, Clubbing or edema        Data Review:    Recent Labs  Lab 03/25/23 1008 03/25/23 1034 03/26/23 0323  WBC 5.4  --  5.8  HGB 11.6* 11.9* 10.5*  HCT 38.2 35.0* 33.6*  PLT 217  --  189  MCV 86.8  --  86.4  MCH 26.4  --  27.0  MCHC 30.4  --  31.3  RDW 15.3  --  15.2  LYMPHSABS 1.2  --   --   MONOABS 0.4  --   --   EOSABS 0.1  --   --   BASOSABS 0.0  --   --     Recent Labs  Lab 03/25/23 1008 03/25/23 1034 03/26/23 0323  NA 142 143 139  K 3.8 3.8 3.7  CL 112* 112* 112*  CO2 20*  --  20*  ANIONGAP 10  --  7  GLUCOSE 89 87 112*  BUN  12 13 11   CREATININE 0.62 0.50 0.53  AST 18  --  16  ALT 16  --  14  ALKPHOS 45  --  35*  BILITOT 0.4  --  0.6  ALBUMIN 2.9*  --  2.4*  DDIMER 3.26*  --   --   INR 1.0  --   --   BNP 27.4  --   --   MG  --   --  1.9  CALCIUM 8.2*  --  7.9*      Recent Labs  Lab 03/25/23 1008 03/26/23 0323  DDIMER 3.26*  --   INR 1.0  --   BNP 27.4  --   MG  --  1.9  CALCIUM 8.2* 7.9*     Radiology Reports CT Angio Chest PE W and/or Wo Contrast  Result Date: 03/25/2023 CLINICAL DATA:  Positive D-dimer. EXAM: CT ANGIOGRAPHY CHEST WITH CONTRAST TECHNIQUE: Multidetector CT imaging of the chest was performed using the standard protocol during bolus administration of intravenous contrast. Multiplanar  CT image reconstructions and MIPs were obtained to evaluate the vascular anatomy. RADIATION DOSE REDUCTION: This exam was performed according to the departmental dose-optimization program which includes automated exposure control, adjustment of the mA and/or kV according to patient size and/or use of iterative reconstruction technique. CONTRAST:  75mL OMNIPAQUE IOHEXOL 350 MG/ML SOLN COMPARISON:  CT PE protocol 01/28/2022 FINDINGS: Cardiovascular: There is adequate opacification of the pulmonary arteries to the segmental level. Pulmonary emboli are seen within the within the distal left main pulmonary artery extending into segmental and subsegmental branches of the left lower lobe and segmental branches of the left upper lobe. There also pulmonary emboli seen within segmental and subsegmental branches of the right lower lobe right middle lobe and right upper lobe. Heart and aorta are normal in size. There is no pericardial effusion. Mediastinum/Nodes: No enlarged mediastinal, hilar, or axillary lymph nodes. Thyroid gland, trachea, and esophagus demonstrate no significant findings. Lungs/Pleura: Lungs are clear. No pleural effusion or pneumothorax. Upper Abdomen: No acute findings. There are hypodensities in the  liver which are too small to characterize favored as cysts measuring up to 11 mm, unchanged. Musculoskeletal: No chest wall abnormality. No acute or significant osseous findings. Review of the MIP images confirms the above findings. IMPRESSION: 1. Bilateral pulmonary emboli, moderate burden. Positive for acute PE with CT evidence of right heart strain (RV/LV Ratio = 1.0) consistent with at least submassive (intermediate risk) PE. The presence of right heart strain has been associated with an increased risk of morbidity and mortality. These results were called by telephone at the time of interpretation on 03/25/2023 at 6:19 pm to provider Dr. Durwin Nora, who verbally acknowledged these results. Electronically Signed   By: Darliss Cheney M.D.   On: 03/25/2023 18:19   VAS [place] LOWER EXTREMITY VENOUS (DVT) (7a-7p)  Result Date: 03/25/2023  Lower Venous DVT Study Patient Name:  MORAYMA GOMOLKA  Date of Exam:   03/25/2023 Medical Rec #: [id number]      Accession #:    [id number] Date of Birth: 1961-01-07      Patient Gender: F Patient Age:   26 years Exam Location:  [facility] Procedure:      VAS [place] LOWER EXTREMITY VENOUS (DVT) Referring Phys: Theron Arista MESSICK --------------------------------------------------------------------------------  Indications: Swelling, and left focal calf pain when walking.  Risk Factors: DVT right peroneal vein 01/27/22. Limitations: Body habitus and pitting edema. Comparison Study: Prior negative bilateral LEV done 06/17/22 Performing Technologist: Sherren Kerns RVS  Examination Guidelines: A complete evaluation includes B-mode imaging, spectral Doppler, color Doppler, and power Doppler as needed of all accessible portions of each vessel. Bilateral testing is considered an integral part of a complete examination. Limited examinations for reoccurring indications may be performed as noted. The reflux portion of the exam is performed with the patient in reverse Trendelenburg.   +---------+---------------+---------+-----------+----------+--------------+ RIGHT    CompressibilityPhasicitySpontaneityPropertiesThrombus Aging +---------+---------------+---------+-----------+----------+--------------+ CFV      Full           Yes      Yes                                 +---------+---------------+---------+-----------+----------+--------------+ SFJ      Full                                                        +---------+---------------+---------+-----------+----------+--------------+  FV Prox  Full                                                        +---------+---------------+---------+-----------+----------+--------------+ FV Mid   Full           Yes      Yes                                 +---------+---------------+---------+-----------+----------+--------------+ FV DistalFull                                                        +---------+---------------+---------+-----------+----------+--------------+ PFV      Full                                                        +---------+---------------+---------+-----------+----------+--------------+ POP      Full           Yes      Yes                                 +---------+---------------+---------+-----------+----------+--------------+ PTV      Full                                                        +---------+---------------+---------+-----------+----------+--------------+ PERO     Full                                                        +---------+---------------+---------+-----------+----------+--------------+   +---------+---------------+---------+-----------+----------+--------------+ LEFT     CompressibilityPhasicitySpontaneityPropertiesThrombus Aging +---------+---------------+---------+-----------+----------+--------------+ CFV      Full           Yes      Yes                                  +---------+---------------+---------+-----------+----------+--------------+ SFJ      Full                                                        +---------+---------------+---------+-----------+----------+--------------+ FV Prox  Full                                                        +---------+---------------+---------+-----------+----------+--------------+  FV Mid   Full           Yes      Yes                                 +---------+---------------+---------+-----------+----------+--------------+ FV DistalFull                                                        +---------+---------------+---------+-----------+----------+--------------+ PFV      Full                                                        +---------+---------------+---------+-----------+----------+--------------+ POP      Full           Yes      Yes                                 +---------+---------------+---------+-----------+----------+--------------+ PTV      Full                                                        +---------+---------------+---------+-----------+----------+--------------+ PERO     Full                                                        +---------+---------------+---------+-----------+----------+--------------+     Summary: BILATERAL: - No evidence of deep vein thrombosis seen in the lower extremities, bilaterally. -No evidence of popliteal cyst, bilaterally.   *See table(s) above for measurements and observations. Electronically signed by Lemar Livings MD on 03/25/2023 at 4:39:58 PM.    Final    DG Chest Port 1 View  Result Date: 03/25/2023 CLINICAL DATA:  Shortness of breath. EXAM: PORTABLE CHEST 1 VIEW COMPARISON:  11/06/2022. FINDINGS: Clear lungs. Normal heart size and mediastinal contours. No pleural effusion or pneumothorax. Visualized bones and upper abdomen are unremarkable. IMPRESSION: No evidence of acute cardiopulmonary disease. Electronically  Signed   By: Orvan Falconer M.D.   On: 03/25/2023 10:49      Signature  -   Susa Raring M.D on 03/26/2023 at 10:17 AM   -  To page go to [email or web address]

## 2023-03-26 NOTE — TOC CM/SW Note (Signed)
CM acknowledges "Consult to Case Management" for medication assistance.  MATCH completed and Xarelto coupon given

## 2023-03-26 NOTE — Progress Notes (Signed)
ANTICOAGULATION CONSULT NOTE  Pharmacy Consult for heparin  Indication: pulmonary embolus Brief A/P: Heparin level slightly supratherapeutic Continue Heparin at current rate for now  Allergies  Allergen Reactions   Iodinated Contrast Media Anaphylaxis   Ketamine Anaphylaxis   Gluten Meal Nausea Only    Makes stomach hurt also   Lactose Intolerance (Gi) Diarrhea and Nausea Only    Makes stomach hurt also   Other Swelling and Rash    Radioactive dyes, heavy metals like nickel   Wheat Diarrhea and Nausea Only    Makes stomach hurt also   Cephalexin Itching    Patient Measurements: Height: 5\' 4"  (162.6 cm) Weight: 95.3 kg (210 lb 1.6 oz) IBW/kg (Calculated) : 54.7 Heparin Dosing Weight: 76.5kg   Vital Signs: Temp: 97.8 F (36.6 C) (06/04 0252) Temp Source: Tympanic (06/04 0252) BP: 103/65 (06/04 0300) Pulse Rate: 65 (06/04 0300)  Labs: Recent Labs    03/25/23 1008 03/25/23 1034 03/25/23 1306 03/26/23 0323  HGB 11.6* 11.9*  --  10.5*  HCT 38.2 35.0*  --  33.6*  PLT 217  --   --  189  LABPROT 13.8  --   --   --   INR 1.0  --   --   --   HEPARINUNFRC  --   --   --  0.75*  CREATININE 0.62 0.50  --   --   TROPONINIHS 3  --  5  --      Estimated Creatinine Clearance: 81.6 mL/min (by C-G formula based on SCr of 0.5 mg/dL).  Assessment: 62 y.o. female with PE for heparin.    Goal of Therapy:  Heparin level 0.3-0.7 units/ml Monitor platelets by anticoagulation protocol: Yes   Plan:  Anticipate level may drop with time following initial, bolus, so will continue Heparin at current rate for now Check heparin level in 6 hours and adjust at that time if needed  Geannie Risen, PharmD, BCPS 03/26/2023,4:01 AM

## 2023-03-26 NOTE — ED Notes (Signed)
Lab at bedside to obtain labs

## 2023-03-26 NOTE — TOC Initial Note (Addendum)
Transition of Care [facility]) - Initial/Assessment Note    Patient Details  Name: Carrie [name] MRN: [id number] Date of Birth: [birth year]  Transition of Care [facility]) CM/SW Contact:    Gordy Clement, RN Phone Number: 03/26/2023, 12:17 PM  Clinical Narrative:          CM met with patient bedside to discuss dc needs.  Patient will need Xarelto  30 day coupon - provided.   Patient gave CM letter stating she did not want to change from heparin to Lovenox. Provider notified .   Request has been sent for patient to get established at [facility] until such time as she has insurance. They will fill out  her PAP application for [employer] and [employer] for [employer] . Initially patient stated she would not want to go to "clinic". After revisiting with patient and explaining the importance, she stated she would go   NO DME / Home Health recommended as of now   [facility] will continue to follow patient for any additional discharge needs                  Barriers to Discharge: Continued Medical Work up   Patient Goals and CMS Choice Patient states their goals for this hospitalization and ongoing recovery are:: go to new apartment CMS Medicare.gov Compare Post Acute Care list provided to::  (N/A) Choice offered to / list presented to : NA      Expected Discharge Plan and Services   Discharge Planning Services: CM Consult (Medication Assistance) Post Acute Care Choice: NA Living arrangements for the past 2 months: Single Family Home                           HH Arranged: NA HH Agency: NA        Prior Living Arrangements/Services Living arrangements for the past 2 months: Single Family Home Lives with:: Self Patient language and need for interpreter reviewed:: Yes Do you feel safe going back to the place where you live?: Yes      Need for Family Participation in Patient Care: No (Comment) Care giver support system in place?: Yes (comment) Current home  services:  (NONE) Criminal Activity/Legal Involvement Pertinent to Current Situation/Hospitalization: No - Comment as needed  Activities of Daily Living Home Assistive Devices/Equipment: None ADL Screening (condition at time of admission) Patient's cognitive ability adequate to safely complete daily activities?: Yes Is the patient deaf or have difficulty hearing?: No Does the patient have difficulty seeing, even when wearing glasses/contacts?: No Does the patient have difficulty concentrating, remembering, or making decisions?: No Patient able to express need for assistance with ADLs?: No Does the patient have difficulty dressing or bathing?: No Independently performs ADLs?: Yes (appropriate for developmental age) Does the patient have difficulty walking or climbing stairs?: No Weakness of Legs: None Weakness of Arms/Hands: None  Permission Sought/Granted Permission sought to share information with : PCP Permission granted to share information with : Yes, Verbal Permission Granted  Share Information with NAME: New PCP           Emotional Assessment Appearance:: Appears stated age Attitude/Demeanor/Rapport: Engaged Affect (typically observed): Pleasant, Hopeful Orientation: : Oriented to Self, Oriented to  Time, Oriented to Place, Oriented to Situation Alcohol / Substance Use: Not Applicable Psych Involvement: No (comment)  Admission diagnosis:  Lower extremity edema [R60.0] Bilateral pulmonary embolism (HCC) [I26.99] Dyspnea, unspecified type [R06.00] Acute pulmonary embolism with acute cor pulmonale, unspecified  pulmonary embolism type [facility]) [I26.09] Patient Active Problem List   Diagnosis Date Noted   Bilateral pulmonary embolism (HCC) 03/25/2023   Acute anemia 03/25/2023   Obese 01/31/2022   Hypokalemia 01/31/2022   Hypophosphatemia 01/31/2022   Elevated troponin 01/28/2022   Sinus tachycardia 01/28/2022   Acute pulmonary embolism (HCC) 01/27/2022   Pulmonary emboli  (HCC) 01/27/2022   LEUKOCYTOPENIA UNSPECIFIED 11/07/2010   DYSLEXIA 11/07/2010   HYPHEMA 11/07/2010   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/07/2010   OVARIAN CYST 11/07/2010   NONSPEC ELEVATION OF LEVELS OF TRANSAMINASE/LDH 11/07/2010   NEPHROLITHIASIS, HX OF 11/07/2010   PCP:  Oneita Hurt, No Pharmacy:   [id number] - [place], [place] - [address] [place] [address] Phone: [phone] Fax: [phone]  [id number] - [place], [place] - [address] AT [facility] [address] [place] [address] Phone: [phone] Fax: [phone]  [id number] [place], [place] - [address] AT [facility] OF [address] [place] [address] Phone: [phone] Fax: [phone]  [id number] - [place] - [address] [place] [address] Phone: [phone] Fax: [phone]     Social Determinants of Health (SDOH) Social History: SDOH Screenings   Food Insecurity: No Food Insecurity (03/26/2023)  Housing: Low Risk  (03/26/2023)  Transportation Needs: No Transportation Needs (03/26/2023)  Utilities: Not At Risk (03/26/2023)  Tobacco Use: Low Risk  (03/25/2023)   SDOH Interventions:     Readmission Risk Interventions    01/31/2022   12:01 PM  Readmission Risk Prevention Plan  Post Dischage Appt Not Complete  Appt Comments new PCP apt not until May 16  Medication Screening Complete  Transportation Screening Complete

## 2023-03-26 NOTE — Progress Notes (Addendum)
ANTICOAGULATION CONSULT NOTE  Pharmacy Consult for heparin>Xarelto Indication: pulmonary embolus Brief A/P: Heparin level slightly supratherapeutic Continue Heparin at current rate for now  Allergies  Allergen Reactions   Iodinated Contrast Media Anaphylaxis   Ketamine Anaphylaxis   Gluten Meal Nausea Only    Makes stomach hurt also   Lactose Intolerance (Gi) Diarrhea and Nausea Only    Makes stomach hurt also   Other Swelling and Rash    Radioactive dyes, heavy metals like nickel   Wheat Diarrhea and Nausea Only    Makes stomach hurt also   Cephalexin Itching    Patient Measurements: Height: 5\' 4"  (162.6 cm) Weight: 95.3 kg (210 lb 1.6 oz) IBW/kg (Calculated) : 54.7 Heparin Dosing Weight: 76.5kg   Vital Signs: Temp: 99.2 F (37.3 C) (06/04 0752) Temp Source: Oral (06/04 0752) BP: 156/86 (06/04 0752) Pulse Rate: 86 (06/04 0752)  Labs: Recent Labs    03/25/23 1008 03/25/23 1034 03/25/23 1306 03/26/23 0323  HGB 11.6* 11.9*  --  10.5*  HCT 38.2 35.0*  --  33.6*  PLT 217  --   --  189  LABPROT 13.8  --   --   --   INR 1.0  --   --   --   HEPARINUNFRC  --   --   --  0.75*  CREATININE 0.62 0.50  --  0.53  TROPONINIHS 3  --  5  --      Estimated Creatinine Clearance: 81.6 mL/min (by C-G formula based on SCr of 0.53 mg/dL).  Assessment: 62 y.o. female with PE for heparin.    Repeat heparin level is pending. Plan for another 24 hrs of heparin then lifelong Xarelto.   Addendum  Difficulty drawing labs. Change to Lovenox today then Xarelto in AM.  Goal of Therapy:  Heparin level 0.3-0.7 units/ml Monitor platelets by anticoagulation protocol: Yes   Plan:  Dc heparin  Lovenox 90mg  SQ BID x2 doses then Xarelto 15mg  BID x21d then 20mg  qday Rx will follow peripherally after that  Ulyses Southward, PharmD, BCIDP, AAHIVP, CPP Infectious Disease Pharmacist 03/26/2023 11:06 AM

## 2023-03-26 NOTE — Progress Notes (Signed)
Echocardiogram 2D Echocardiogram has been performed.  Carrie [name] 03/26/2023, 11:15 AM

## 2023-03-27 ENCOUNTER — Other Ambulatory Visit (HOSPITAL_COMMUNITY): Payer: Self-pay

## 2023-03-27 MED ORDER — APIXABAN (ELIQUIS) VTE STARTER PACK (10MG AND 5MG)
ORAL_TABLET | ORAL | 0 refills | Status: DC
  Filled 2023-03-27 (×2): qty 74, 30d supply, fill #0

## 2023-03-27 MED ORDER — PANTOPRAZOLE SODIUM 40 MG PO TBEC
40.0000 mg | DELAYED_RELEASE_TABLET | Freq: Every day | ORAL | Status: DC
  Filled 2023-03-27: qty 1

## 2023-03-27 MED ORDER — FOLIC ACID 1 MG PO TABS
1.0000 mg | ORAL_TABLET | Freq: Every day | ORAL | 0 refills | Status: DC
  Filled 2023-03-27: qty 30, 30d supply, fill #0

## 2023-03-27 MED ORDER — RIVAROXABAN 15 MG PO TABS: 15.0000 mg | ORAL_TABLET | Freq: Two times a day (BID) | ORAL | 0 refills | Status: DC

## 2023-03-27 MED ORDER — POTASSIUM CHLORIDE CRYS ER 10 MEQ PO TBCR
10.0000 meq | EXTENDED_RELEASE_TABLET | Freq: Every day | ORAL | 0 refills | Status: DC
  Filled 2023-03-27: qty 3, 3d supply, fill #0

## 2023-03-27 MED ORDER — PANTOPRAZOLE SODIUM 40 MG PO TBEC
40.0000 mg | DELAYED_RELEASE_TABLET | Freq: Every day | ORAL | 0 refills | Status: DC
  Filled 2023-03-27: qty 30, 30d supply, fill #0

## 2023-03-27 MED ORDER — FUROSEMIDE 40 MG PO TABS
40.0000 mg | ORAL_TABLET | Freq: Once | ORAL | Status: DC
  Filled 2023-03-27: qty 1

## 2023-03-27 MED ORDER — RIVAROXABAN 20 MG PO TABS
20.0000 mg | ORAL_TABLET | Freq: Every day | ORAL | 0 refills | Status: DC
  Filled 2023-03-27: qty 30, 30d supply, fill #0

## 2023-03-27 MED ORDER — FUROSEMIDE 40 MG PO TABS
40.0000 mg | ORAL_TABLET | Freq: Every day | ORAL | 0 refills | Status: DC
  Filled 2023-03-27: qty 3, 3d supply, fill #0

## 2023-03-27 MED ORDER — FERROUS SULFATE 325 (65 FE) MG PO TABS
325.0000 mg | ORAL_TABLET | Freq: Two times a day (BID) | ORAL | 0 refills | Status: DC
  Filled 2023-03-27: qty 100, 50d supply, fill #0

## 2023-03-27 MED ORDER — RIVAROXABAN 20 MG PO TABS: 20.0000 mg | ORAL_TABLET | Freq: Every day | ORAL | 0 refills | Status: DC

## 2023-03-27 MED ORDER — POTASSIUM CHLORIDE CRYS ER 20 MEQ PO TBCR
60.0000 meq | EXTENDED_RELEASE_TABLET | Freq: Once | ORAL | Status: AC
  Administered 2023-03-27: 60 meq via ORAL
  Filled 2023-03-27: qty 3

## 2023-03-27 MED ORDER — RIVAROXABAN 15 MG PO TABS
15.0000 mg | ORAL_TABLET | Freq: Two times a day (BID) | ORAL | 0 refills | Status: DC
  Filled 2023-03-27: qty 38, 19d supply, fill #0

## 2023-03-27 MED ORDER — FOLIC ACID 1 MG PO TABS
1.0000 mg | ORAL_TABLET | Freq: Every day | ORAL | Status: DC
  Administered 2023-03-27: 1 mg via ORAL
  Filled 2023-03-27: qty 1

## 2023-03-27 MED ORDER — FERROUS SULFATE 325 (65 FE) MG PO TABS
325.0000 mg | ORAL_TABLET | Freq: Two times a day (BID) | ORAL | Status: DC
  Administered 2023-03-27: 325 mg via ORAL
  Filled 2023-03-27: qty 1

## 2023-03-27 NOTE — Plan of Care (Signed)

## 2023-03-27 NOTE — Discharge Summary (Addendum)
Carrie [name] ZOX:[id number] DOB: 01/22/1961 DOA: 03/25/2023  PCP: Pcp, No  Admit date: 03/25/2023  Discharge date: 03/27/2023  Admitted From: Home   Disposition:  Home   Recommendations for Outpatient Follow-up:   Follow up with PCP in 1-2 weeks  PCP Please obtain BMP/CBC, 2 view CXR in 1week,  (see Discharge instructions)   PCP Please follow up on the following pending results:    Home Health: None   Equipment/Devices: None  Consultations: None  Discharge Condition: Stable    CODE STATUS: Full    Diet Recommendation: Heart Healthy     Chief Complaint  Patient presents with   Leg Swelling     Brief history of present illness from the day of admission and additional interim summary    62 y.o. female with medical history significant for previous pulmonary embolism/DVT, who is admitted to [facility] on 03/25/2023 with acute bilateral pulmonary emboli with suggestion of right heart strain after presenting from home to [facility] ED complaining of shortness of breath.  Her workup was consistent with submassive PE of note she has had PE in the past which required tPA and 6 months of Xarelto, she also has family history of hypercoagulable state with multiple family members on mom side having DVT and PEs.                                                                  Hospital Course   Acute bilateral submassive PE with some evidence of cor pulmonale on CTA.  Thankfully she is hemodynamically stable on heparin drip which will be continued, echo pending, this is her second episode of large PE with family history of hypercoagulable state, lower extremity venous duplex unremarkable.  She is symptom-free on room air with stable echocardiogram, will be discharged on Xarelto with outpatient follow-up with PCP.  Recommend  lifelong anticoagulation at this point.  Note patient wants to self-pay for Xarelto, states she has the money, wanted printed prescriptions of Xarelto given.  Refused trying Eliquis, has already used Xarelto for 3 card before.  Few minutes after leaving the hospital she requested that we send Eliquis to the [facility] pharmacy, she has changed her mind, Eliquis was sent to [facility] pharmacy.      Chronic anemia with some elements of iron deficiency.  Stable, placed on oral iron and folic acid along with PPI.  Outpatient anemia workup by PCP as age-appropriate.  Mild fluid overload and edema due to IV fluids administered initially due to large PE load in the ER, gentle diuresis for the next few days.  PCP to monitor.  Lungs are clear.   Discharge diagnosis     Principal Problem:   Bilateral pulmonary embolism (HCC) Active Problems:   Acute anemia    Discharge instructions  Discharge Instructions     Discharge instructions   Complete by: As directed    Follow with Primary MD in 7 days   Get CBC, CMP  -  checked next visit with your primary MD    Activity: As tolerated with Full fall precautions use walker/cane & assistance as needed  Disposition Home    Diet: Heart Healthy   Special Instructions: If you have smoked or chewed Tobacco  in the last 2 yrs please stop smoking, stop any regular Alcohol  and or any Recreational drug use.  On your next visit with your primary care physician please Get Medicines reviewed and adjusted.  Please request your Prim.MD to go over all Hospital Tests and Procedure/Radiological results at the follow up, please get all Hospital records sent to your Prim MD by signing hospital release before you go home.  If you experience worsening of your admission symptoms, develop shortness of breath, life threatening emergency, suicidal or homicidal thoughts you must seek medical attention immediately by calling 911 or calling your MD immediately  if symptoms  less severe.  You Must read complete instructions/literature along with all the possible adverse reactions/side effects for all the Medicines you take and that have been prescribed to you. Take any new Medicines after you have completely understood and accpet all the possible adverse reactions/side effects.   Increase activity slowly   Complete by: As directed        Discharge Medications   Allergies as of 03/27/2023       Reactions   Iodinated Contrast Media Anaphylaxis   Ketamine Anaphylaxis   Gluten Meal Nausea Only   Makes stomach hurt also   Lactose Intolerance (gi) Diarrhea, Nausea Only   Makes stomach hurt also   Other Swelling, Rash   Radioactive dyes, heavy metals like nickel   Wheat Diarrhea, Nausea Only   Makes stomach hurt also   Cephalexin Itching        Medication List     STOP taking these medications    aspirin EC 81 MG tablet       TAKE these medications    ferrous sulfate 325 (65 FE) MG tablet Take 1 tablet (325 mg total) by mouth 2 (two) times daily with a meal.   folic acid 1 MG tablet Commonly known as: FOLVITE Take 1 tablet (1 mg total) by mouth daily.   furosemide 40 MG tablet Commonly known as: LASIX Take 1 tablet (40 mg total) by mouth daily. Start taking on: March 28, 2023   guaiFENesin 600 MG 12 hr tablet Commonly known as: MUCINEX Take 600 mg by mouth 2 (two) times daily.   pantoprazole 40 MG tablet Commonly known as: PROTONIX Take 1 tablet (40 mg total) by mouth daily.   potassium chloride 10 MEQ tablet Commonly known as: KLOR-CON Take 1 tablet (10 mEq total) by mouth daily. Start taking on: March 28, 2023   Rivaroxaban 15 MG Tabs tablet Commonly known as: XARELTO Take 1 tablet (15 mg total) by mouth 2 (two) times daily with a meal for 19 days.   rivaroxaban 20 MG Tabs tablet Commonly known as: XARELTO Take 1 tablet (20 mg total) by mouth daily with supper. Start taking on: April 16, 2023         Follow-up  Information     Morene Crocker, MD Follow up.   Specialty: Student Why: TIME : 10:45 AM DATE:  April 03, 2023 LOCATION: [facility] , ENTRANCE-A , GROUND FLOOR [id number]  [address] [place], [place] [address] $25.00 CO-PAY Contact information: [address] [place] [address] [phone]                 Major procedures and Radiology Reports - PLEASE review detailed and final reports thoroughly  -       ECHOCARDIOGRAM COMPLETE  Result Date: 03/26/2023    ECHOCARDIOGRAM REPORT   Patient Name:   Carrie [name] Date of Exam: 03/26/2023 Medical Rec #:  [id number]     Height:       64.0 in Accession #:    [id number]    Weight:       210.1 lb Date of Birth:  June 24, 1961     BSA:          1.998 m Patient Age:    62 years      BP:           156/86 mmHg Patient Gender: F             HR:           75 bpm. Exam Location:  Inpatient Procedure: 2D Echo, Cardiac Doppler and Color Doppler Indications:    Pulmonary Embolus I26.09  History:        Patient has prior history of Echocardiogram examinations, most                 recent 01/27/2022. Arrythmias:Tachycardia.  Sonographer:    Lucendia Herrlich Referring Phys: [id number] JUSTIN B HOWERTER IMPRESSIONS  1. Left ventricular ejection fraction, by estimation, is 55 to 60%. The left ventricle has normal function. The left ventricle has no regional wall motion abnormalities. Left ventricular diastolic parameters are consistent with Grade I diastolic dysfunction (impaired relaxation).  2. Right ventricular systolic function is normal. The right ventricular size is normal. Tricuspid regurgitation signal is inadequate for assessing PA pressure.  3. Left atrial size was mildly dilated.  4. The mitral valve is normal in structure. No evidence of mitral valve regurgitation. No evidence of mitral stenosis.  5. The aortic valve is normal in structure. Aortic valve regurgitation is not visualized. No aortic stenosis is present.  6. The inferior vena cava is  dilated in size with >50% respiratory variability, suggesting right atrial pressure of 8 mmHg. Conclusion(s)/Recommendation(s): The IVC is mildly dialted but no evidence of RV strain. FINDINGS  Left Ventricle: Left ventricular ejection fraction, by estimation, is 55 to 60%. The left ventricle has normal function. The left ventricle has no regional wall motion abnormalities. The left ventricular internal cavity size was normal in size. There is  no left ventricular hypertrophy. Left ventricular diastolic parameters are consistent with Grade I diastolic dysfunction (impaired relaxation). Right Ventricle: The right ventricular size is normal. No increase in right ventricular wall thickness. Right ventricular systolic function is normal. Tricuspid regurgitation signal is inadequate for assessing PA pressure. The tricuspid regurgitant velocity is 2.29 m/s, and with an assumed right atrial pressure of 8 mmHg, the estimated right ventricular systolic pressure is 29.0 mmHg. Left Atrium: Left atrial size was mildly dilated. Right Atrium: Right atrial size was normal in size. Pericardium: There is no evidence of pericardial effusion. Mitral Valve: The mitral valve is normal in structure. No evidence of mitral valve regurgitation. No evidence of mitral valve stenosis. Tricuspid Valve: The tricuspid valve is normal in structure. Tricuspid valve regurgitation is trivial. No evidence of tricuspid stenosis. Aortic Valve: The aortic valve is normal in structure. Aortic valve regurgitation is not visualized. No aortic stenosis  is present. Aortic valve peak gradient measures 14.4 mmHg. Pulmonic Valve: The pulmonic valve was normal in structure. Pulmonic valve regurgitation is not visualized. No evidence of pulmonic stenosis. Aorta: The aortic root is normal in size and structure. Venous: The inferior vena cava is dilated in size with greater than 50% respiratory variability, suggesting right atrial pressure of 8 mmHg. IAS/Shunts: No  atrial level shunt detected by color flow Doppler.  LEFT VENTRICLE PLAX 2D LVIDd:         4.50 cm   Diastology LVIDs:         3.20 cm   LV e' medial:    10.10 cm/s LV PW:         1.10 cm   LV E/e' medial:  9.7 LV IVS:        0.90 cm   LV e' lateral:   10.10 cm/s LVOT diam:     2.20 cm   LV E/e' lateral: 9.7 LV SV:         120 LV SV Index:   60 LVOT Area:     3.80 cm  RIGHT VENTRICLE             IVC RV S prime:     14.80 cm/s  IVC diam: 2.50 cm TAPSE (M-mode): 2.2 cm LEFT ATRIUM             Index        RIGHT ATRIUM           Index LA diam:        4.20 cm 2.10 cm/m   RA Area:     13.60 cm LA Vol (A2C):   42.2 ml 21.12 ml/m  RA Volume:   30.30 ml  15.17 ml/m LA Vol (A4C):   38.7 ml 19.37 ml/m LA Biplane Vol: 41.4 ml 20.72 ml/m  AORTIC VALVE AV Area (Vmax): 3.22 cm AV Vmax:        190.00 cm/s AV Peak Grad:   14.4 mmHg LVOT Vmax:      161.00 cm/s LVOT Vmean:     105.000 cm/s LVOT VTI:       0.315 m  AORTA Ao Root diam: 3.20 cm Ao Asc diam:  3.40 cm MITRAL VALVE                TRICUSPID VALVE MV Area (PHT): 4.07 cm     TR Peak grad:   21.0 mmHg MV E velocity: 97.83 cm/s   TR Vmax:        229.00 cm/s MV A velocity: 131.00 cm/s MV E/A ratio:  0.75         SHUNTS                             Systemic VTI:  0.32 m                             Systemic Diam: 2.20 cm Arvilla Meres MD Electronically signed by Arvilla Meres MD Signature Date/Time: 03/26/2023/11:42:05 AM    Final    CT Angio Chest PE W and/or Wo Contrast  Result Date: 03/25/2023 CLINICAL DATA:  Positive D-dimer. EXAM: CT ANGIOGRAPHY CHEST WITH CONTRAST TECHNIQUE: Multidetector CT imaging of the chest was performed using the standard protocol during bolus administration of intravenous contrast. Multiplanar CT image reconstructions and MIPs were obtained to evaluate the vascular anatomy. RADIATION DOSE REDUCTION: This exam  was performed according to the departmental dose-optimization program which includes automated exposure control, adjustment of the  mA and/or kV according to patient size and/or use of iterative reconstruction technique. CONTRAST:  75mL OMNIPAQUE IOHEXOL 350 MG/ML SOLN COMPARISON:  CT PE protocol 01/28/2022 FINDINGS: Cardiovascular: There is adequate opacification of the pulmonary arteries to the segmental level. Pulmonary emboli are seen within the within the distal left main pulmonary artery extending into segmental and subsegmental branches of the left lower lobe and segmental branches of the left upper lobe. There also pulmonary emboli seen within segmental and subsegmental branches of the right lower lobe right middle lobe and right upper lobe. Heart and aorta are normal in size. There is no pericardial effusion. Mediastinum/Nodes: No enlarged mediastinal, hilar, or axillary lymph nodes. Thyroid gland, trachea, and esophagus demonstrate no significant findings. Lungs/Pleura: Lungs are clear. No pleural effusion or pneumothorax. Upper Abdomen: No acute findings. There are hypodensities in the liver which are too small to characterize favored as cysts measuring up to 11 mm, unchanged. Musculoskeletal: No chest wall abnormality. No acute or significant osseous findings. Review of the MIP images confirms the above findings. IMPRESSION: 1. Bilateral pulmonary emboli, moderate burden. Positive for acute PE with CT evidence of right heart strain (RV/LV Ratio = 1.0) consistent with at least submassive (intermediate risk) PE. The presence of right heart strain has been associated with an increased risk of morbidity and mortality. These results were called by telephone at the time of interpretation on 03/25/2023 at 6:19 pm to provider Dr. Durwin Nora, who verbally acknowledged these results. Electronically Signed   By: Darliss Cheney M.D.   On: 03/25/2023 18:19   VAS [place] LOWER EXTREMITY VENOUS (DVT) (7a-7p)  Result Date: 03/25/2023  Lower Venous DVT Study Patient Name:  Carrie [name]  Date of Exam:   03/25/2023 Medical Rec #: [id number]      Accession #:     [id number] Date of Birth: 1961-09-27      Patient Gender: F Patient Age:   30 years Exam Location:  [place] Procedure:      VAS [place] LOWER EXTREMITY VENOUS (DVT) Referring Phys: Theron Arista MESSICK --------------------------------------------------------------------------------  Indications: Swelling, and left focal calf pain when walking.  Risk Factors: DVT right peroneal vein 01/27/22. Limitations: Body habitus and pitting edema. Comparison Study: Prior negative bilateral LEV done 06/17/22 Performing Technologist: Sherren Kerns RVS  Examination Guidelines: A complete evaluation includes B-mode imaging, spectral Doppler, color Doppler, and power Doppler as needed of all accessible portions of each vessel. Bilateral testing is considered an integral part of a complete examination. Limited examinations for reoccurring indications may be performed as noted. The reflux portion of the exam is performed with the patient in reverse Trendelenburg.  +---------+---------------+---------+-----------+----------+--------------+ RIGHT    CompressibilityPhasicitySpontaneityPropertiesThrombus Aging +---------+---------------+---------+-----------+----------+--------------+ CFV      Full           Yes      Yes                                 +---------+---------------+---------+-----------+----------+--------------+ SFJ      Full                                                        +---------+---------------+---------+-----------+----------+--------------+ FV Prox  Full                                                        +---------+---------------+---------+-----------+----------+--------------+  FV Mid   Full           Yes      Yes                                 +---------+---------------+---------+-----------+----------+--------------+ FV DistalFull                                                        +---------+---------------+---------+-----------+----------+--------------+ PFV       Full                                                        +---------+---------------+---------+-----------+----------+--------------+ POP      Full           Yes      Yes                                 +---------+---------------+---------+-----------+----------+--------------+ PTV      Full                                                        +---------+---------------+---------+-----------+----------+--------------+ PERO     Full                                                        +---------+---------------+---------+-----------+----------+--------------+   +---------+---------------+---------+-----------+----------+--------------+ LEFT     CompressibilityPhasicitySpontaneityPropertiesThrombus Aging +---------+---------------+---------+-----------+----------+--------------+ CFV      Full           Yes      Yes                                 +---------+---------------+---------+-----------+----------+--------------+ SFJ      Full                                                        +---------+---------------+---------+-----------+----------+--------------+ FV Prox  Full                                                        +---------+---------------+---------+-----------+----------+--------------+ FV Mid   Full           Yes      Yes                                 +---------+---------------+---------+-----------+----------+--------------+  FV DistalFull                                                        +---------+---------------+---------+-----------+----------+--------------+ PFV      Full                                                        +---------+---------------+---------+-----------+----------+--------------+ POP      Full           Yes      Yes                                 +---------+---------------+---------+-----------+----------+--------------+ PTV      Full                                                         +---------+---------------+---------+-----------+----------+--------------+ PERO     Full                                                        +---------+---------------+---------+-----------+----------+--------------+     Summary: BILATERAL: - No evidence of deep vein thrombosis seen in the lower extremities, bilaterally. -No evidence of popliteal cyst, bilaterally.   *See table(s) above for measurements and observations. Electronically signed by Lemar Livings MD on 03/25/2023 at 4:39:58 PM.    Final    DG Chest Port 1 View  Result Date: 03/25/2023 CLINICAL DATA:  Shortness of breath. EXAM: PORTABLE CHEST 1 VIEW COMPARISON:  11/06/2022. FINDINGS: Clear lungs. Normal heart size and mediastinal contours. No pleural effusion or pneumothorax. Visualized bones and upper abdomen are unremarkable. IMPRESSION: No evidence of acute cardiopulmonary disease. Electronically Signed   By: Orvan Falconer M.D.   On: 03/25/2023 10:49    Micro Results     No results found for this or any previous visit (from the past 240 hour(s)).  Today   Subjective    Carrie [name] today has no headache,no chest abdominal pain,no new weakness tingling or numbness, feels much better wants to go home today.     Objective   Blood pressure 121/78, pulse 75, temperature 98 F (36.7 C), temperature source Oral, resp. rate 16, height 5\' 4"  (1.626 m), weight 104.1 kg, SpO2 97 %.  No intake or output data in the 24 hours ending 03/27/23 0820  Exam  Awake Alert, No new F.N deficits,    [place].AT,PERRAL Supple Neck,   Symmetrical Chest wall movement, Good air movement bilaterally, CTAB RRR,No Gallops,   +ve B.Sounds, Abd Soft, Non tender,  No Cyanosis, trace edema    Data Review   Recent Labs  Lab 03/25/23 1008 03/25/23 1034 03/26/23 0323  WBC 5.4  --  5.8  HGB 11.6* 11.9* 10.5*  HCT 38.2 35.0* 33.6*  PLT 217  --  189  MCV 86.8  --  86.4  MCH 26.4  --  27.0  MCHC 30.4  --  31.3  RDW  15.3  --  15.2  LYMPHSABS 1.2  --   --   MONOABS 0.4  --   --   EOSABS 0.1  --   --   BASOSABS 0.0  --   --     Recent Labs  Lab 03/25/23 1008 03/25/23 1034 03/26/23 0323 03/26/23 1135  NA 142 143 139  --   K 3.8 3.8 3.7  --   CL 112* 112* 112*  --   CO2 20*  --  20*  --   ANIONGAP 10  --  7  --   GLUCOSE 89 87 112*  --   BUN 12 13 11   --   CREATININE 0.62 0.50 0.53  --   AST 18  --  16  --   ALT 16  --  14  --   ALKPHOS 45  --  35*  --   BILITOT 0.4  --  0.6  --   ALBUMIN 2.9*  --  2.4*  --   DDIMER 3.26*  --   --   --   INR 1.0  --   --   --   BNP 27.4  --   --   --   MG  --   --  1.9 2.0  CALCIUM 8.2*  --  7.9*  --     Total Time in preparing paper work, data evaluation and todays exam - 35 minutes  Signature  -    Susa Raring M.D on 03/27/2023 at 8:20 AM   -  To page go to [email or web address]

## 2023-03-27 NOTE — Plan of Care (Signed)
                                      [facility]                            [address] [place], [place] [address]      Shani Reddic was admitted to the Hospital on 03/25/2023 and Discharged  03/27/2023 and should be excused from work/school   for 5 days starting from date -  03/25/2023 , may return to work/school without any restrictions.  Call Susa Raring MD, [facility]  [phone] with questions.  Susa Raring M.D on 03/27/2023,at 10:04 AM  [facility]   Office  [phone]

## 2023-03-27 NOTE — Plan of Care (Signed)
  Problem: Education: Goal: Knowledge of General Education information will improve Description Including pain rating scale, medication(s)/side effects and non-pharmacologic comfort measures Outcome: Progressing   Problem: Clinical Measurements: Goal: Ability to maintain clinical measurements within normal limits will improve Outcome: Progressing Goal: Respiratory complications will improve Outcome: Progressing   Problem: Coping: Goal: Level of anxiety will decrease Outcome: Progressing   Problem: Pain Managment: Goal: General experience of comfort will improve Outcome: Progressing   

## 2023-03-27 NOTE — Discharge Instructions (Addendum)
Follow with Primary MD in 7 days   Get CBC, CMP  -  checked next visit with your primary MD    Activity: As tolerated with Full fall precautions use walker/cane & assistance as needed  Disposition Home    Diet: Heart Healthy   Special Instructions: If you have smoked or chewed Tobacco  in the last 2 yrs please stop smoking, stop any regular Alcohol  and or any Recreational drug use.  On your next visit with your primary care physician please Get Medicines reviewed and adjusted.  Please request your Prim.MD to go over all Hospital Tests and Procedure/Radiological results at the follow up, please get all Hospital records sent to your Prim MD by signing hospital release before you go home.  If you experience worsening of your admission symptoms, develop shortness of breath, life threatening emergency, suicidal or homicidal thoughts you must seek medical attention immediately by calling 911 or calling your MD immediately  if symptoms less severe.  You Must read complete instructions/literature along with all the possible adverse reactions/side effects for all the Medicines you take and that have been prescribed to you. Take any new Medicines after you have completely understood and accpet all the possible adverse reactions/side effects.     Information on my medicine - XARELTO (rivaroxaban)  This medication education was reviewed with me or my healthcare representative as part of my discharge preparation.  The pharmacist that spoke with me during my hospital stay was:    WHY WAS XARELTO PRESCRIBED FOR YOU? Xarelto was prescribed to treat blood clots that may have been found in the veins of your legs (deep vein thrombosis) or in your lungs (pulmonary embolism) and to reduce the risk of them occurring again.  What do you need to know about Xarelto? The starting dose is one 15 mg tablet taken TWICE daily with food for the FIRST 21 DAYS then on (enter date)  04/16/23  the dose is changed to  one 20 mg tablet taken ONCE A DAY with your evening meal.  DO NOT stop taking Xarelto without talking to the health care provider who prescribed the medication.  Refill your prescription for 20 mg tablets before you run out.  After discharge, you should have regular check-up appointments with your healthcare provider that is prescribing your Xarelto.  In the future your dose may need to be changed if your kidney function changes by a significant amount.  What do you do if you miss a dose? If you are taking Xarelto TWICE DAILY and you miss a dose, take it as soon as you remember. You may take two 15 mg tablets (total 30 mg) at the same time then resume your regularly scheduled 15 mg twice daily the next day.  If you are taking Xarelto ONCE DAILY and you miss a dose, take it as soon as you remember on the same day then continue your regularly scheduled once daily regimen the next day. Do not take two doses of Xarelto at the same time.   Important Safety Information Xarelto is a blood thinner medicine that can cause bleeding. You should call your healthcare provider right away if you experience any of the following: Bleeding from an injury or your nose that does not stop. Unusual colored urine (red or dark brown) or unusual colored stools (red or black). Unusual bruising for unknown reasons. A serious fall or if you hit your head (even if there is no bleeding).  Some medicines may interact with  Xarelto and might increase your risk of bleeding while on Xarelto. To help avoid this, consult your healthcare provider or pharmacist prior to using any new prescription or non-prescription medications, including herbals, vitamins, non-steroidal anti-inflammatory drugs (NSAIDs) and supplements.  This website has more information on Xarelto: [email or web address].br.

## 2023-03-27 NOTE — TOC Transition Note (Signed)
Transition of Care [facility]) - CM/SW Discharge Note   Patient Details  Name: Carrie [name] MRN: [id number] Date of Birth: Feb 03, 1961  [facility] CM/SW Contact:  Gordy Clement, RN Phone Number: 03/27/2023, 8:50 AM   Clinical Narrative:     Patient to dc to home today.  Patient has Xarelto coupon. Match entered.  Per patient, her friend will transport home.         Barriers to Discharge: Continued Medical Work up   Patient Goals and CMS Choice CMS Medicare.gov Compare Post Acute Care list provided to::  (N/A) Choice offered to / list presented to : NA  Discharge Placement                         Discharge Plan and Services Additional resources added to the After Visit Summary for   In-house Referral: NA Discharge Planning Services: MATCH Program, Medication Assistance, Follow-up appt scheduled Post Acute Care Choice: NA          DME Arranged: N/A DME Agency: NA       HH Arranged: NA HH Agency: NA        Social Determinants of Health (SDOH) Interventions SDOH Screenings   Food Insecurity: No Food Insecurity (03/26/2023)  Housing: Low Risk  (03/26/2023)  Transportation Needs: No Transportation Needs (03/26/2023)  Utilities: Not At Risk (03/26/2023)  Tobacco Use: Low Risk  (03/25/2023)     Readmission Risk Interventions    01/31/2022   12:01 PM  Readmission Risk Prevention Plan  Post Dischage Appt Not Complete  Appt Comments new PCP apt not until May 16  Medication Screening Complete  Transportation Screening Complete

## 2023-03-28 ENCOUNTER — Other Ambulatory Visit (HOSPITAL_COMMUNITY): Payer: Self-pay

## 2023-04-03 ENCOUNTER — Encounter (HOSPITAL_COMMUNITY): Payer: Self-pay | Admitting: *Deleted

## 2023-04-03 ENCOUNTER — Ambulatory Visit (HOSPITAL_COMMUNITY)
Admission: EM | Admit: 2023-04-03 | Discharge: 2023-04-03 | Disposition: A | Discharge status: Home / Self Care | Payer: Self-pay | Attending: Internal Medicine | Admitting: Internal Medicine

## 2023-04-03 ENCOUNTER — Ambulatory Visit: Payer: Self-pay | Admitting: Student

## 2023-04-03 DIAGNOSIS — T887XXA Unspecified adverse effect of drug or medicament, initial encounter: Secondary | ICD-10-CM

## 2023-04-03 DIAGNOSIS — I2699 Other pulmonary embolism without acute cor pulmonale: Secondary | ICD-10-CM

## 2023-04-03 DIAGNOSIS — R112 Nausea with vomiting, unspecified: Secondary | ICD-10-CM

## 2023-04-03 MED ORDER — RIVAROXABAN (XARELTO) VTE STARTER PACK (15 & 20 MG): ORAL_TABLET | ORAL | 0 refills | Status: DC

## 2023-04-03 NOTE — ED Triage Notes (Addendum)
C/O transient nausea and stomach pain starting as soon as she started on Eliquis 03/28/23 - states had previously been taking Xarelto and tolerated that well - states unsure why her dr switched meds.

## 2023-04-03 NOTE — Discharge Instructions (Signed)
Take Xarelto as prescribed (15 mg every 12 hours for the next 21 days, then 20 mg tablet once daily ongoing).  Please schedule an appointment with your pulmonologist for follow-up.  Return if any new or worsening symptoms.

## 2023-04-03 NOTE — ED Provider Notes (Signed)
[facility]    CSN: [id number] Arrival date & time: 04/03/23  1205      History   Chief Complaint Chief Complaint  Patient presents with   Nausea   Abdominal Pain    HPI Carrie [name] is a 62 y.o. female.   Patient presents to urgent care for evaluation of nausea, vomiting, and abdominal discomfort after starting Eliquis for VTE prophylaxis on March 28, 2023.  She has been taking Eliquis as prescribed for the last 6 days after she was discharged from the hospital due to bilateral pulmonary embolisms.  She became nauseous and had a few episodes of emesis over the last couple days.  Currently able to keep down fluids. Emesis is non bloody/non bilious.  Denies blood/mucus in the stools.  She states she has been minimally constipated over the last several days. She was supposed to start taking iron and protonix but states she has not taken these medications and prefers a "more natural route".  She is requesting to be switched back to Xarelto anticoagulant today as she has been able to tolerate this in the past without "having to take any extra medications to protect her stomach". Denies chest pain, shortness of breath, heart palpitations, and weakness.    Abdominal Pain   Past Medical History:  Diagnosis Date   Arthritis    DVT (deep venous thrombosis) (HCC)    High cholesterol    Kidney stones    Ovarian cyst    Pulmonary embolism [facility])     Patient Active Problem List   Diagnosis Date Noted   Bilateral pulmonary embolism (HCC) 03/25/2023   Acute anemia 03/25/2023   Obese 01/31/2022   Hypokalemia 01/31/2022   Hypophosphatemia 01/31/2022   Elevated troponin 01/28/2022   Sinus tachycardia 01/28/2022   Acute pulmonary embolism (HCC) 01/27/2022   Pulmonary emboli (HCC) 01/27/2022   LEUKOCYTOPENIA UNSPECIFIED 11/07/2010   DYSLEXIA 11/07/2010   HYPHEMA 11/07/2010   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/07/2010   OVARIAN CYST 11/07/2010   NONSPEC ELEVATION OF  LEVELS OF TRANSAMINASE/LDH 11/07/2010   NEPHROLITHIASIS, HX OF 11/07/2010    Past Surgical History:  Procedure Laterality Date   IR ANGIOGRAM PULMONARY BILATERAL SELECTIVE  01/28/2022   IR ANGIOGRAM SELECTIVE EACH ADDITIONAL VESSEL  01/28/2022   IR INFUSION THROMBOL ARTERIAL INITIAL (MS)  01/28/2022   IR INFUSION THROMBOL ARTERIAL INITIAL (MS)  01/28/2022   IR THROMB F/U EVAL ART/VEN FINAL DAY (MS)  01/29/2022   IR US GUIDE VASC ACCESS RIGHT  01/28/2022   IR US GUIDE VASC ACCESS RIGHT  01/28/2022   OVARIAN CYST REMOVAL      OB History   No obstetric history on file.      Home Medications    Prior to Admission medications   Medication Sig Start Date End Date Taking? Authorizing Provider  ferrous sulfate 325 (65 FE) MG tablet Take 1 tablet (325 mg total) by mouth 2 (two) times daily with a meal for  30 days, then as directed by your physician. 03/27/23  Yes Leroy Sea, MD  folic acid (FOLVITE) 1 MG tablet Take 1 tablet (1 mg total) by mouth daily. 03/27/23  Yes Leroy Sea, MD  furosemide (LASIX) 40 MG tablet Take 1 tablet (40 mg total) by mouth daily. 03/28/23  Yes Leroy Sea, MD  pantoprazole (PROTONIX) 40 MG tablet Take 1 tablet (40 mg total) by mouth daily. 03/27/23  Yes Leroy Sea, MD  potassium chloride (KLOR-CON M) 10 MEQ tablet Take 1  tablet (10 mEq total) by mouth daily. 03/28/23  Yes Leroy Sea, MD  RIVAROXABAN Carlena Hurl) VTE STARTER PACK (15 & 20 MG) Take 15 mg by mouth every 12 (twelve) hours for 21 days, THEN 20 mg daily for 21 days. Follow package directions: Take one 15mg  tablet by mouth twice a day. On day 22, switch to one 20mg  tablet once a day. Take with food.. 04/03/23 05/15/23 Yes Carlisle Beers, FNP  guaiFENesin (MUCINEX) 600 MG 12 hr tablet Take 600 mg by mouth 2 (two) times daily.    [provider]  potassium chloride (KLOR-CON) 10 MEQ tablet Take 1 tablet (10 mEq total) by mouth daily for 7 days. 06/18/22 11/06/22  Kommor, Wyn Forster, MD     Family History Family History  Problem Relation Age of Onset   Pulmonary embolism Father     Social History Social History   Tobacco Use   Smoking status: Never   Smokeless tobacco: Never  Vaping Use   Vaping Use: Never used  Substance Use Topics   Alcohol use: No   Drug use: No     Allergies   Iodinated contrast media, Ketamine, Other, and Cephalexin   Review of Systems Review of Systems  Gastrointestinal:  Positive for abdominal pain.  Per HPI   Physical Exam Triage Vital Signs ED Triage Vitals  Enc Vitals Group     BP 04/03/23 1305 116/78     Pulse Rate 04/03/23 1305 91     Resp 04/03/23 1305 16     Temp 04/03/23 1305 98.2 F (36.8 C)     Temp Source 04/03/23 1305 Oral     SpO2 04/03/23 1305 96 %     Weight --      Height --      Head Circumference --      Peak Flow --      Pain Score 04/03/23 1306 0     Pain Loc --      Pain Edu? --      Excl. in GC? --    No data found.  Updated Vital Signs BP 116/78   Pulse 91   Temp 98.2 F (36.8 C) (Oral)   Resp 16   LMP 01/22/2014   SpO2 96%   Visual Acuity Right Eye Distance:   Left Eye Distance:   Bilateral Distance:    Right Eye Near:   Left Eye Near:    Bilateral Near:     Physical Exam Vitals and nursing note reviewed.  Constitutional:      Appearance: She is not ill-appearing or toxic-appearing.  HENT:     Head: Normocephalic and atraumatic.     Right Ear: Hearing and external ear normal.     Left Ear: Hearing and external ear normal.     Nose: Nose normal.     Mouth/Throat:     Lips: Pink.  Eyes:     General: Lids are normal. Vision grossly intact. Gaze aligned appropriately.     Extraocular Movements: Extraocular movements intact.     Conjunctiva/sclera: Conjunctivae normal.  Pulmonary:     Effort: Pulmonary effort is normal.  Musculoskeletal:     Cervical back: Neck supple.  Skin:    General: Skin is warm and dry.     Capillary Refill: Capillary refill takes less than 2  seconds.     Findings: No rash.  Neurological:     General: No focal deficit present.     Mental Status: She is alert and  oriented to person, place, and time. Mental status is at baseline.     Cranial Nerves: No dysarthria or facial asymmetry.  Psychiatric:        Mood and Affect: Mood is anxious.        Speech: Speech normal.        Behavior: Behavior normal.        Thought Content: Thought content normal.        Judgment: Judgment normal.      UC Treatments / Results  Labs (all labs ordered are listed, but only abnormal results are displayed) Labs Reviewed - No data to display  EKG   Radiology No results found.  Procedures Procedures (including critical care time)  Medications Ordered in UC Medications - No data to display  Initial Impression / Assessment and Plan / UC Course  I have reviewed the triage vital signs and the nursing notes.  Pertinent labs & imaging results that were available during my care of the patient were reviewed by me and considered in my medical decision making (see chart for details).   1.  Medication side effect, bilateral pulmonary embolism, nausea and vomiting Presentation is consistent with acute GI upset after starting new medication Eliquis.  Patient is requesting to switch back to Xarelto for chronic anticoagulation use for VTE prophylaxis.  I am agreeable with this.  She likely experienced GI symptoms as she did not take her Protonix as prescribed by provider at the hospital. She is well appearing with hemodynamically stable vital signs and no other concerns.  She states that her pulmonology appointment is scheduled but she cannot remember when it days.  I encouraged her to follow-up with pulmonology as well as PCP.   Per up-to-date, patient to start taking Xarelto 15 mg twice daily for the next 21 days, then 20 mg once daily ongoing. Discussed bleeding precautions with patient.  Strict ER and urgent care return precautions discussed as well.   She is agreeable plan.  Discussed physical exam and available lab work findings in clinic with patient.  Counseled patient regarding appropriate use of medications and potential side effects for all medications recommended or prescribed today. Discussed red flag signs and symptoms of worsening condition,when to call the PCP office, return to urgent care, and when to seek higher level of care in the emergency department. Patient verbalizes understanding and agreement with plan. All questions answered. Patient discharged in stable condition.    Final Clinical Impressions(s) / UC Diagnoses   Final diagnoses:  Medication side effect  Bilateral pulmonary embolism (HCC)  Nausea and vomiting, unspecified vomiting type     Discharge Instructions      Take Xarelto as prescribed (15 mg every 12 hours for the next 21 days, then 20 mg tablet once daily ongoing).  Please schedule an appointment with your pulmonologist for follow-up.  Return if any new or worsening symptoms.     ED Prescriptions     Medication Sig Dispense Auth. Provider   RIVAROXABAN Carlena Hurl) VTE STARTER PACK (15 & 20 MG) Take 15 mg by mouth every 12 (twelve) hours for 21 days, THEN 20 mg daily for 21 days. Follow package directions: Take one 15mg  tablet by mouth twice a day. On day 22, switch to one 20mg  tablet once a day. Take with food.. 51 each Alorah Mcree, Donavan Burnet, FNP      PDMP not reviewed this encounter.   Carlisle Beers, [place] 04/03/23 1359

## 2023-11-22 ENCOUNTER — Encounter (HOSPITAL_COMMUNITY): Payer: Self-pay

## 2023-11-22 ENCOUNTER — Ambulatory Visit (HOSPITAL_COMMUNITY)
Admission: EM | Admit: 2023-11-22 | Discharge: 2023-11-22 | Disposition: A | Discharge status: Home / Self Care | Payer: Self-pay | Attending: Emergency Medicine | Admitting: Emergency Medicine

## 2023-11-22 DIAGNOSIS — J0100 Acute maxillary sinusitis, unspecified: Secondary | ICD-10-CM

## 2023-11-22 MED ORDER — AMOXICILLIN-POT CLAVULANATE 875-125 MG PO TABS: 1.0000 | ORAL_TABLET | Freq: Two times a day (BID) | ORAL | 0 refills | Status: DC

## 2023-11-22 MED ORDER — PREDNISONE 20 MG PO TABS: 40.0000 mg | ORAL_TABLET | Freq: Every day | ORAL | 0 refills | Status: AC

## 2023-11-22 NOTE — Discharge Instructions (Signed)
Start taking Augmentin twice daily for 7 days and prednisone once daily for 5 days for sinus infection.  Otherwise alternate between Tylenol and ibuprofen as needed for pain and fever.  Recommend taking Mucinex as needed for cough and congestion.  Return here if symptoms persist or worsen.

## 2023-11-22 NOTE — ED Triage Notes (Signed)
Patient here today with c/o cough, wheeze, congestion, nausea, and diarrhea X 4 days. She has been taking Mucinex and IBU with no relief. No known sick contacts.

## 2023-11-22 NOTE — ED Provider Notes (Signed)
[facility]    CSN: [id number] Arrival date & time: 11/22/23  1458      History   Chief Complaint Chief Complaint  Patient presents with   Cough   Wheezing    HPI Carrie [name] is a 63 y.o. female.   Patient presents with cough, congestion, sinus pressure, headache, chills, nausea, and diarrhea x 4 days.  Patient states that nausea and diarrhea have subsided but the cough, congestion, headache, and sinus pressure have continued.   Cough Associated symptoms: chills, headaches and rhinorrhea   Associated symptoms: no chest pain, no fever, no shortness of breath and no wheezing   Wheezing Associated symptoms: cough, headaches and rhinorrhea   Associated symptoms: no chest pain, no chest tightness, no fatigue, no fever and no shortness of breath     Past Medical History:  Diagnosis Date   Arthritis    DVT (deep venous thrombosis) (HCC)    High cholesterol    Kidney stones    Ovarian cyst    Pulmonary embolism [facility])     Patient Active Problem List   Diagnosis Date Noted   Bilateral pulmonary embolism (HCC) 03/25/2023   Acute anemia 03/25/2023   Obese 01/31/2022   Hypokalemia 01/31/2022   Hypophosphatemia 01/31/2022   Elevated troponin 01/28/2022   Sinus tachycardia 01/28/2022   Acute pulmonary embolism (HCC) 01/27/2022   Pulmonary emboli (HCC) 01/27/2022   Leukocytopenia 11/07/2010   DYSLEXIA 11/07/2010   HYPHEMA 11/07/2010   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/07/2010   OVARIAN CYST 11/07/2010   NONSPEC ELEVATION OF LEVELS OF TRANSAMINASE/LDH 11/07/2010   NEPHROLITHIASIS, HX OF 11/07/2010    Past Surgical History:  Procedure Laterality Date   IR ANGIOGRAM PULMONARY BILATERAL SELECTIVE  01/28/2022   IR ANGIOGRAM SELECTIVE EACH ADDITIONAL VESSEL  01/28/2022   IR INFUSION THROMBOL ARTERIAL INITIAL (MS)  01/28/2022   IR INFUSION THROMBOL ARTERIAL INITIAL (MS)  01/28/2022   IR THROMB F/U EVAL ART/VEN FINAL DAY (MS)  01/29/2022   IR US GUIDE VASC ACCESS  RIGHT  01/28/2022   IR US GUIDE VASC ACCESS RIGHT  01/28/2022   OVARIAN CYST REMOVAL      OB History   No obstetric history on file.      Home Medications    Prior to Admission medications   Medication Sig Start Date End Date Taking? Authorizing Provider  amoxicillin-clavulanate (AUGMENTIN) 875-125 MG tablet Take 1 tablet by mouth every 12 (twelve) hours. 11/22/23  Yes Susann Givens, Andalyn Heckstall A, NP  predniSONE (DELTASONE) 20 MG tablet Take 2 tablets (40 mg total) by mouth daily for 5 days. 11/22/23 11/27/23 Yes Susann Givens, Leighton Brickley A, NP  ferrous sulfate 325 (65 FE) MG tablet Take 1 tablet (325 mg total) by mouth 2 (two) times daily with a meal for  30 days, then as directed by your physician. 03/27/23   Leroy Sea, MD  folic acid (FOLVITE) 1 MG tablet Take 1 tablet (1 mg total) by mouth daily. 03/27/23   Leroy Sea, MD  furosemide (LASIX) 40 MG tablet Take 1 tablet (40 mg total) by mouth daily. Patient not taking: Reported on 11/22/2023 03/28/23   Leroy Sea, MD  guaiFENesin (MUCINEX) 600 MG 12 hr tablet Take 600 mg by mouth 2 (two) times daily.    [provider]  potassium chloride (KLOR-CON M) 10 MEQ tablet Take 1 tablet (10 mEq total) by mouth daily. 03/28/23   Leroy Sea, MD  RIVAROXABAN Carlena Hurl) VTE STARTER PACK (15 & 20 MG) Take 15  mg by mouth every 12 (twelve) hours for 21 days, THEN 20 mg daily for 21 days. Follow package directions: Take one 15mg  tablet by mouth twice a day. On day 22, switch to one 20mg  tablet once a day. Take with food.. Patient not taking: Reported on 11/22/2023 04/03/23 05/15/23  Carlisle Beers, FNP  potassium chloride (KLOR-CON) 10 MEQ tablet Take 1 tablet (10 mEq total) by mouth daily for 7 days. 06/18/22 11/06/22  Kommor, Wyn Forster, MD    Family History Family History  Problem Relation Age of Onset   Pulmonary embolism Father     Social History Social History   Tobacco Use   Smoking status: Never   Smokeless tobacco: Never  Vaping  Use   Vaping status: Never Used  Substance Use Topics   Alcohol use: No   Drug use: No     Allergies   Iodinated contrast media, Ketamine, Gluten meal, Lactose intolerance (gi), Other, and Cephalexin   Review of Systems Review of Systems  Constitutional:  Positive for chills. Negative for fatigue and fever.  HENT:  Positive for congestion, rhinorrhea, sinus pressure and sinus pain.   Respiratory:  Positive for cough. Negative for chest tightness, shortness of breath and wheezing.   Cardiovascular:  Negative for chest pain.  Gastrointestinal:  Positive for diarrhea and nausea. Negative for abdominal pain and vomiting.  Neurological:  Positive for headaches.     Physical Exam Triage Vital Signs ED Triage Vitals  Encounter Vitals Group     BP 11/22/23 1659 (!) 143/88     Systolic BP Percentile --      Diastolic BP Percentile --      Pulse Rate 11/22/23 1659 92     Resp 11/22/23 1659 16     Temp 11/22/23 1659 97.9 F (36.6 C)     Temp Source 11/22/23 1659 Oral     SpO2 11/22/23 1659 95 %     Weight 11/22/23 1659 210 lb (95.3 kg)     Height 11/22/23 1659 5\' 3"  (1.6 m)     Head Circumference --      Peak Flow --      Pain Score 11/22/23 1703 0     Pain Loc --      Pain Education --      Exclude from Growth Chart --    No data found.  Updated Vital Signs BP (!) 143/88 (BP Location: Left Arm)   Pulse 92   Temp 97.9 F (36.6 C) (Oral)   Resp 16   Ht 5\' 3"  (1.6 m)   Wt 210 lb (95.3 kg)   LMP 01/22/2014   SpO2 95%   BMI 37.20 kg/m   Visual Acuity Right Eye Distance:   Left Eye Distance:   Bilateral Distance:    Right Eye Near:   Left Eye Near:    Bilateral Near:     Physical Exam Vitals and nursing note reviewed.  Constitutional:      General: She is awake. She is not in acute distress.    Appearance: Normal appearance. She is well-developed and well-groomed. She is not ill-appearing.  HENT:     Right Ear: Tympanic membrane, ear canal and external ear  normal.     Left Ear: Tympanic membrane, ear canal and external ear normal.     Nose: Congestion and rhinorrhea present.     Right Sinus: Maxillary sinus tenderness present.     Left Sinus: Maxillary sinus tenderness present.     Mouth/Throat:  Mouth: Mucous membranes are moist.     Pharynx: Posterior oropharyngeal erythema present.  Cardiovascular:     Rate and Rhythm: Normal rate and regular rhythm.  Pulmonary:     Effort: Pulmonary effort is normal.     Breath sounds: Normal breath sounds.  Abdominal:     General: Abdomen is flat. Bowel sounds are normal.     Palpations: Abdomen is soft.     Tenderness: There is no abdominal tenderness.  Skin:    General: Skin is warm and dry.  Neurological:     Mental Status: She is alert.  Psychiatric:        Behavior: Behavior is cooperative.      UC Treatments / Results  Labs (all labs ordered are listed, but only abnormal results are displayed) Labs Reviewed - No data to display  EKG   Radiology No results found.  Procedures Procedures (including critical care time)  Medications Ordered in UC Medications - No data to display  Initial Impression / Assessment and Plan / UC Course  I have reviewed the triage vital signs and the nursing notes.  Pertinent labs & imaging results that were available during my care of the patient were reviewed by me and considered in my medical decision making (see chart for details).     Patient presented with cough, congestion, sinus pressure, headache, chills, nausea, and vomiting that began 4 days ago.  Patient states the nausea and diarrhea have subsided but congestion, headache, and sinus pressure have persisted.  Upon assessment congestion and rhinorrhea are present, mild erythema noted to pharynx, and bilateral maxillary sinus tenderness noted.  Lungs clear bilaterally on auscultation  Prescribed Augmentin and prednisone for sinusitis.  Discussed return precautions. Final Clinical  Impressions(s) / UC Diagnoses   Final diagnoses:  Acute non-recurrent maxillary sinusitis     Discharge Instructions      Start taking Augmentin twice daily for 7 days and prednisone once daily for 5 days for sinus infection.  Otherwise alternate between Tylenol and ibuprofen as needed for pain and fever.  Recommend taking Mucinex as needed for cough and congestion.  Return here if symptoms persist or worsen.    ED Prescriptions     Medication Sig Dispense Auth. Provider   amoxicillin-clavulanate (AUGMENTIN) 875-125 MG tablet Take 1 tablet by mouth every 12 (twelve) hours. 14 tablet Susann Givens, Kaiyla Stahly A, NP   predniSONE (DELTASONE) 20 MG tablet Take 2 tablets (40 mg total) by mouth daily for 5 days. 10 tablet Wynonia Lawman A, NP      PDMP not reviewed this encounter.   Wynonia Lawman A, NP 11/22/23 [phone]

## 2024-02-23 ENCOUNTER — Encounter (HOSPITAL_BASED_OUTPATIENT_CLINIC_OR_DEPARTMENT_OTHER): Payer: Self-pay | Admitting: *Deleted

## 2024-02-23 ENCOUNTER — Inpatient Hospital Stay (HOSPITAL_BASED_OUTPATIENT_CLINIC_OR_DEPARTMENT_OTHER)
Admission: EM | Admit: 2024-02-23 | Discharge: 2024-02-25 | DRG: 392 | Disposition: A | Discharge status: Home / Self Care | Payer: Self-pay | Attending: Internal Medicine | Admitting: Internal Medicine

## 2024-02-23 ENCOUNTER — Emergency Department (HOSPITAL_BASED_OUTPATIENT_CLINIC_OR_DEPARTMENT_OTHER): Payer: Self-pay

## 2024-02-23 ENCOUNTER — Other Ambulatory Visit: Payer: Self-pay

## 2024-02-23 DIAGNOSIS — Z7982 Long term (current) use of aspirin: Secondary | ICD-10-CM

## 2024-02-23 DIAGNOSIS — Z7901 Long term (current) use of anticoagulants: Secondary | ICD-10-CM

## 2024-02-23 DIAGNOSIS — K5289 Other specified noninfective gastroenteritis and colitis: Principal | ICD-10-CM | POA: Diagnosis present

## 2024-02-23 DIAGNOSIS — E66812 Obesity, class 2: Secondary | ICD-10-CM | POA: Diagnosis present

## 2024-02-23 DIAGNOSIS — N200 Calculus of kidney: Secondary | ICD-10-CM | POA: Diagnosis present

## 2024-02-23 DIAGNOSIS — D509 Iron deficiency anemia, unspecified: Secondary | ICD-10-CM | POA: Diagnosis present

## 2024-02-23 DIAGNOSIS — Z79899 Other long term (current) drug therapy: Secondary | ICD-10-CM

## 2024-02-23 DIAGNOSIS — R109 Unspecified abdominal pain: Secondary | ICD-10-CM | POA: Diagnosis present

## 2024-02-23 DIAGNOSIS — K5900 Constipation, unspecified: Secondary | ICD-10-CM | POA: Diagnosis present

## 2024-02-23 DIAGNOSIS — Z5329 Procedure and treatment not carried out because of patient's decision for other reasons: Secondary | ICD-10-CM | POA: Diagnosis present

## 2024-02-23 DIAGNOSIS — Z6838 Body mass index (BMI) 38.0-38.9, adult: Secondary | ICD-10-CM

## 2024-02-23 DIAGNOSIS — E7800 Pure hypercholesterolemia, unspecified: Secondary | ICD-10-CM | POA: Diagnosis present

## 2024-02-23 DIAGNOSIS — Z91041 Radiographic dye allergy status: Secondary | ICD-10-CM

## 2024-02-23 DIAGNOSIS — I5032 Chronic diastolic (congestive) heart failure: Secondary | ICD-10-CM | POA: Diagnosis present

## 2024-02-23 DIAGNOSIS — R1084 Generalized abdominal pain: Secondary | ICD-10-CM

## 2024-02-23 DIAGNOSIS — K5641 Fecal impaction: Secondary | ICD-10-CM | POA: Diagnosis present

## 2024-02-23 DIAGNOSIS — E860 Dehydration: Secondary | ICD-10-CM | POA: Diagnosis present

## 2024-02-23 DIAGNOSIS — N19 Unspecified kidney failure: Secondary | ICD-10-CM | POA: Diagnosis present

## 2024-02-23 DIAGNOSIS — Z86711 Personal history of pulmonary embolism: Secondary | ICD-10-CM

## 2024-02-23 DIAGNOSIS — Z86718 Personal history of other venous thrombosis and embolism: Secondary | ICD-10-CM

## 2024-02-23 DIAGNOSIS — R7989 Other specified abnormal findings of blood chemistry: Secondary | ICD-10-CM | POA: Diagnosis present

## 2024-02-23 LAB — COMPREHENSIVE METABOLIC PANEL WITH GFR
ALT: 14 U/L (ref 0–44)
AST: 15 U/L (ref 15–41)
Albumin: 3.9 g/dL (ref 3.5–5.0)
Alkaline Phosphatase: 61 U/L (ref 38–126)
Anion gap: 14 (ref 5–15)
BUN: 22 mg/dL (ref 8–23)
CO2: 19 mmol/L — ABNORMAL LOW (ref 22–32)
Calcium: 8.9 mg/dL (ref 8.9–10.3)
Chloride: 105 mmol/L (ref 98–111)
Creatinine, Ser: 0.62 mg/dL (ref 0.44–1.00)
GFR, Estimated: >60 mL/min (ref >60)
Glucose, Bld: 115 mg/dL — ABNORMAL HIGH (ref 70–99)
Potassium: 4 mmol/L (ref 3.5–5.1)
Sodium: 138 mmol/L (ref 135–145)
Total Bilirubin: <0.2 mg/dL (ref 0.0–1.2)
Total Protein: 6.3 g/dL — ABNORMAL LOW (ref 6.5–8.1)

## 2024-02-23 LAB — CBC WITH DIFFERENTIAL/PLATELET
Abs Immature Granulocytes: 0.03 10*3/uL (ref 0.00–0.07)
Basophils Absolute: 0 10*3/uL (ref 0.0–0.1)
Basophils Relative: 0 %
Eosinophils Absolute: 0 10*3/uL (ref 0.0–0.5)
Eosinophils Relative: 0 %
HCT: 36.5 % (ref 36.0–46.0)
Hemoglobin: 11.8 g/dL — ABNORMAL LOW (ref 12.0–15.0)
Immature Granulocytes: 0 %
Lymphocytes Relative: 5 %
Lymphs Abs: 0.6 10*3/uL — ABNORMAL LOW (ref 0.7–4.0)
MCH: 25.4 pg — ABNORMAL LOW (ref 26.0–34.0)
MCHC: 32.3 g/dL (ref 30.0–36.0)
MCV: 78.5 fL — ABNORMAL LOW (ref 80.0–100.0)
Monocytes Absolute: 0.6 10*3/uL (ref 0.1–1.0)
Monocytes Relative: 6 %
Neutro Abs: 9.5 10*3/uL — ABNORMAL HIGH (ref 1.7–7.7)
Neutrophils Relative %: 89 %
Platelets: 215 10*3/uL (ref 150–400)
RBC: 4.65 MIL/uL (ref 3.87–5.11)
RDW: 17.1 % — ABNORMAL HIGH (ref 11.5–15.5)
WBC: 10.7 10*3/uL — ABNORMAL HIGH (ref 4.0–10.5)
nRBC: 0 % (ref 0.0–0.2)

## 2024-02-23 LAB — LIPASE, BLOOD: Lipase: 27 U/L (ref 11–51)

## 2024-02-23 MED ORDER — POLYETHYLENE GLYCOL 3350 17 G PO PACK
17.0000 g | PACK | Freq: Two times a day (BID) | ORAL | Status: DC
  Administered 2024-02-23 – 2024-02-24 (×3): 17 g via ORAL
  Filled 2024-02-23 (×4): qty 1

## 2024-02-23 MED ORDER — ACETAMINOPHEN 650 MG RE SUPP: 650.0000 mg | Freq: Four times a day (QID) | RECTAL | Status: DC | PRN

## 2024-02-23 MED ORDER — SODIUM CHLORIDE 0.9 % IV SOLN
2.0000 g | INTRAVENOUS | Status: DC
  Administered 2024-02-24: 2 g via INTRAVENOUS
  Filled 2024-02-23: qty 20

## 2024-02-23 MED ORDER — ONDANSETRON HCL 4 MG/2ML IJ SOLN: 4.0000 mg | Freq: Four times a day (QID) | INTRAMUSCULAR | Status: DC | PRN

## 2024-02-23 MED ORDER — ACETAMINOPHEN 325 MG PO TABS
650.0000 mg | ORAL_TABLET | Freq: Four times a day (QID) | ORAL | Status: DC | PRN
  Administered 2024-02-24 – 2024-02-25 (×5): 650 mg via ORAL
  Filled 2024-02-23 (×5): qty 2

## 2024-02-23 MED ORDER — LACTATED RINGERS IV SOLN: INTRAVENOUS | Status: AC

## 2024-02-23 MED ORDER — SODIUM CHLORIDE 0.9 % IV SOLN
1.0000 g | Freq: Once | INTRAVENOUS | Status: AC
  Administered 2024-02-23: 1 g via INTRAVENOUS
  Filled 2024-02-23: qty 10

## 2024-02-23 MED ORDER — ONDANSETRON 4 MG PO TBDP
4.0000 mg | ORAL_TABLET | Freq: Once | ORAL | Status: AC
  Administered 2024-02-23: 4 mg via ORAL
  Filled 2024-02-23: qty 1

## 2024-02-23 MED ORDER — DOCUSATE SODIUM 100 MG PO CAPS
100.0000 mg | ORAL_CAPSULE | Freq: Two times a day (BID) | ORAL | Status: DC
  Administered 2024-02-23 – 2024-02-25 (×4): 100 mg via ORAL
  Filled 2024-02-23 (×4): qty 1

## 2024-02-23 MED ORDER — NALOXONE HCL 0.4 MG/ML IJ SOLN: 0.4000 mg | INTRAMUSCULAR | Status: DC | PRN

## 2024-02-23 MED ORDER — BISACODYL 10 MG RE SUPP
10.0000 mg | Freq: Once | RECTAL | Status: AC
  Administered 2024-02-23: 10 mg via RECTAL
  Filled 2024-02-23: qty 1

## 2024-02-23 MED ORDER — MORPHINE SULFATE (PF) 4 MG/ML IV SOLN
4.0000 mg | Freq: Once | INTRAVENOUS | Status: AC
  Administered 2024-02-23: 4 mg via INTRAMUSCULAR
  Filled 2024-02-23: qty 1

## 2024-02-23 MED ORDER — METRONIDAZOLE 500 MG/100ML IV SOLN
500.0000 mg | Freq: Two times a day (BID) | INTRAVENOUS | Status: DC
  Administered 2024-02-24: 500 mg via INTRAVENOUS
  Filled 2024-02-23 (×2): qty 100

## 2024-02-23 MED ORDER — MELATONIN 3 MG PO TABS
3.0000 mg | ORAL_TABLET | Freq: Every evening | ORAL | Status: DC | PRN
  Administered 2024-02-24: 3 mg via ORAL
  Filled 2024-02-23: qty 1

## 2024-02-23 MED ORDER — ONDANSETRON HCL 4 MG/2ML IJ SOLN
4.0000 mg | Freq: Once | INTRAMUSCULAR | Status: DC
Start: 2024-02-23 — End: 2024-02-23

## 2024-02-23 MED ORDER — METRONIDAZOLE 500 MG/100ML IV SOLN
500.0000 mg | Freq: Once | INTRAVENOUS | Status: AC
  Administered 2024-02-23: 500 mg via INTRAVENOUS
  Filled 2024-02-23: qty 100

## 2024-02-23 MED ORDER — FENTANYL CITRATE PF 50 MCG/ML IJ SOSY: 25.0000 ug | PREFILLED_SYRINGE | INTRAMUSCULAR | Status: DC | PRN

## 2024-02-23 MED ORDER — MORPHINE SULFATE (PF) 4 MG/ML IV SOLN: 4.0000 mg | Freq: Once | INTRAVENOUS | Status: DC

## 2024-02-23 MED ORDER — FLEET ENEMA RE ENEM: 1.0000 | ENEMA | Freq: Once | RECTAL | Status: DC

## 2024-02-23 NOTE — ED Provider Notes (Signed)
 [facility] Provider Note   CSN: [id number] Arrival date & time: 02/23/24  1317     History  Chief Complaint  Patient presents with   Fecal Impaction    Carrie [name] is a 63 y.o. female with PMHx OA, HLD, ovarian cysts who presents to ED concerned for fecal impaction. Patient denying hx of constipation stating that she takes eats flax and cabbage salads and usually has daily BM's. Patient has not had a BM in 2 days. Patient writhing in pain stating "please do whatever you need to do to remove the impacted stool." Patient attempted to disimpact herself without success. Patient endorsing gas pain in her stomach.  Denies fever, chest pain, dyspnea, cough, nausea, vomiting, diarrhea, dysuria, hematuria, hematochezia.   HPI     Home Medications Prior to Admission medications   Medication Sig Start Date End Date Taking? Authorizing Provider  amoxicillin -clavulanate (AUGMENTIN ) 875-125 MG tablet Take 1 tablet by mouth every 12 (twelve) hours. 11/22/23   Levora Reas A, NP  ferrous sulfate  325 (65 FE) MG tablet Take 1 tablet (325 mg total) by mouth 2 (two) times daily with a meal for  30 days, then as directed by your physician. 03/27/23   Singh, Prashant K, MD  folic acid  (FOLVITE ) 1 MG tablet Take 1 tablet (1 mg total) by mouth daily. 03/27/23   Singh, Prashant K, MD  furosemide  (LASIX ) 40 MG tablet Take 1 tablet (40 mg total) by mouth daily. Patient not taking: Reported on 11/22/2023 03/28/23   Singh, Prashant K, MD  guaiFENesin (MUCINEX) 600 MG 12 hr tablet Take 600 mg by mouth 2 (two) times daily.    [provider]  potassium chloride  (KLOR-CON  M) 10 MEQ tablet Take 1 tablet (10 mEq total) by mouth daily. 03/28/23   Cala Castleman, MD  RIVAROXABAN  (XARELTO ) VTE STARTER PACK (15 & 20 MG) Take 15 mg by mouth every 12 (twelve) hours for 21 days, THEN 20 mg daily for 21 days. Follow package directions: Take one 15mg  tablet by mouth twice  a day. On day 22, switch to one 20mg  tablet once a day. Take with food.. Patient not taking: Reported on 11/22/2023 04/03/23 05/15/23  Starlene Eaton, FNP  potassium chloride  (KLOR-CON ) 10 MEQ tablet Take 1 tablet (10 mEq total) by mouth daily for 7 days. 06/18/22 11/06/22  Kommor, [place], MD      Allergies    Iodinated contrast media, Ketamine, Gluten meal, Lactose intolerance (gi), Other, and Cephalexin     Review of Systems   Review of Systems  Gastrointestinal:  Positive for constipation.    Physical Exam Updated Vital Signs BP 132/72   Pulse 91   Temp 98.3 F (36.8 C) (Oral)   Resp 20   LMP 01/22/2014   SpO2 95%  Physical Exam Vitals and nursing note reviewed.  Constitutional:      General: She is not in acute distress.    Appearance: She is not ill-appearing or toxic-appearing.  HENT:     Head: Normocephalic and atraumatic.     Mouth/Throat:     Mouth: Mucous membranes are moist.  Eyes:     General: No scleral icterus.       Right eye: No discharge.        Left eye: No discharge.     Conjunctiva/sclera: Conjunctivae normal.  Cardiovascular:     Rate and Rhythm: Normal rate and regular rhythm.     Pulses: Normal pulses.  Heart sounds: Normal heart sounds. No murmur heard. Pulmonary:     Effort: Pulmonary effort is normal. No respiratory distress.     Breath sounds: Normal breath sounds. No wheezing, rhonchi or rales.  Abdominal:     General: Abdomen is flat. Bowel sounds are normal. There is no distension.     Palpations: Abdomen is soft. There is no mass.     Tenderness: There is abdominal tenderness.     Comments: Generalized tenderness to palpation  Genitourinary:    Comments: Rectal exam with RN Courtland No external or internal hemorrhoids. Impacted stool ball palpated but too far away and I am not able to remove it. Musculoskeletal:     Right lower leg: No edema.     Left lower leg: No edema.  Skin:    General: Skin is warm and dry.      Findings: No rash.  Neurological:     General: No focal deficit present.     Mental Status: She is alert. Mental status is at baseline.  Psychiatric:        Mood and Affect: Mood normal.     ED Results / Procedures / Treatments   Labs (all labs ordered are listed, but only abnormal results are displayed) Labs Reviewed  CBC WITH DIFFERENTIAL/PLATELET - Abnormal; Notable for the following components:      Result Value   WBC 10.7 (*)    Hemoglobin 11.8 (*)    MCV 78.5 (*)    MCH 25.4 (*)    RDW 17.1 (*)    Neutro Abs 9.5 (*)    Lymphs Abs 0.6 (*)    All other components within normal limits  COMPREHENSIVE METABOLIC PANEL WITH GFR - Abnormal; Notable for the following components:   CO2 19 (*)    Glucose, Bld 115 (*)    Total Protein 6.3 (*)    All other components within normal limits  LIPASE, BLOOD  URINALYSIS, ROUTINE W REFLEX MICROSCOPIC    EKG EKG Interpretation Date/Time:  Sunday Feb 23 2024 13:54:54 EDT Ventricular Rate:  63 PR Interval:  159 QRS Duration:  94 QT Interval:  399 QTC Calculation: 409 R Axis:   39  Text Interpretation: Sinus rhythm Consider right atrial enlargement Confirmed by Lowery Rue [phone] on 02/23/2024 2:23:48 PM  Radiology CT ABDOMEN PELVIS WO CONTRAST Result Date: 02/23/2024 CLINICAL DATA:  Acute abdominal pain. EXAM: CT ABDOMEN AND PELVIS WITHOUT CONTRAST TECHNIQUE: Multidetector CT imaging of the abdomen and pelvis was performed following the standard protocol without IV contrast. RADIATION DOSE REDUCTION: This exam was performed according to the departmental dose-optimization program which includes automated exposure control, adjustment of the mA and/or kV according to patient size and/or use of iterative reconstruction technique. COMPARISON:  CT abdomen and pelvis 07/20/2021 FINDINGS: Lower chest: No acute abnormality. Hepatobiliary: There are 2 cysts in the liver measuring up to 15 mm. Gallbladder and bile ducts are within normal limits.  Pancreas: Unremarkable. No pancreatic ductal dilatation or surrounding inflammatory changes. Spleen: Normal in size without focal abnormality. Adrenals/Urinary Tract: Adrenal glands are unremarkable. Kidneys are normal, without renal calculi, focal lesion, or hydronephrosis. Bladder is unremarkable. Stomach/Bowel: The rectum is dilated and stool-filled measuring up to 9.5 cm. There is presacral edema. Overall, there is a large stool burden. There is no evidence for bowel obstruction, pneumatosis or free air. Appendix is small bowel are within normal limits. There is a small to moderate-sized hiatal hernia. Stomach is otherwise within normal limits. Vascular/Lymphatic: No significant vascular  findings are present. No enlarged abdominal or pelvic lymph nodes. Reproductive: Exophytic fibroid is present from the uterus measuring 15 mm. Adnexa are within normal limits. Other: No abdominal wall hernia or abnormality. No abdominopelvic ascites. Musculoskeletal: No acute or significant osseous findings. IMPRESSION: 1. Findings compatible with fecal impaction. There is presacral edema which may represent stercoral colitis. 2. Small to moderate-sized hiatal hernia. 3. Uterine fibroid.  Are Electronically Signed   By: Tyron Gallon M.D.   On: 02/23/2024 15:43    Procedures .Ultrasound ED Peripheral IV (Provider)  Date/Time: 02/23/2024 4:55 PM  Performed by: [place]cock Bureau, PA-C Authorized by: [place] Bureau, PA-C   Procedure details:    Indications: multiple failed IV attempts     Skin Prep: chlorhexidine  gluconate     Location:  Right AC   Angiocath:  20 G   Bedside Ultrasound Guided: Yes     Images: not archived     Patient tolerated procedure without complications: Yes     Dressing applied: Yes   .Critical Care  Performed by: [place] Bureau, PA-C Authorized by: [place]y Bureau, PA-C   Critical care provider statement:    Critical care time (minutes):  30   Critical care was  necessary to treat or prevent imminent or life-threatening deterioration of the following conditions: Stercoral colitis.   Critical care was time spent personally by me on the following activities:  Development of treatment plan with patient or surrogate, discussions with consultants, evaluation of patient's response to treatment, examination of patient, ordering and review of laboratory studies, ordering and review of radiographic studies, ordering and performing treatments and interventions, pulse oximetry, re-evaluation of patient's condition and review of old charts   Care discussed with: admitting provider   Comments:     Requiring hospital admission     Medications Ordered in ED Medications  metroNIDAZOLE (FLAGYL) IVPB 500 mg (has no administration in time range)  ondansetron  (ZOFRAN -ODT) disintegrating tablet 4 mg (4 mg Oral Given 02/23/24 1534)  morphine  (PF) 4 MG/ML injection 4 mg (4 mg Intramuscular Given 02/23/24 1534)  cefTRIAXone (ROCEPHIN) 1 g in sodium chloride  0.9 % 100 mL IVPB (0 g Intravenous Stopped 02/23/24 1853)    ED Course/ Medical Decision Making/ A&P                                 Medical Decision Making Amount and/or Complexity of Data Reviewed Labs: ordered. Radiology: ordered.  Risk Prescription drug management.    This patient presents to the ED for concern of abdominal pain, this involves an extensive number of treatment options, and is a complaint that carries with it a high risk of complications and morbidity.  The differential diagnosis includes gastroenteritis, colitis, small bowel obstruction, appendicitis, cholecystitis, pancreatitis, nephrolithiasis, UTI, pyelonephritis   Co morbidities that complicate the patient evaluation  OA, HLD, ovarian cysts   Additional history obtained:  No PCP listed in chart    Problem List / ED Course / Critical interventions / Medication management  Admitting patient for stercoral colitis. Patient presented  for abdominal pain related to constipation. Patient stating that she usually has BM's daily but has not had a bowel movement since Friday and has had increasing pain d/t this. Physical exam with generalized abdominal tenderness to palpation. DRE with hardened stool ball that is too far away and I am not able to remove.  I Ordered, and personally interpreted labs.  CBC  with leukocytosis of 10.7.  There is also mild anemia with hemoglobin at 11.8.  CMP reassuring.  Lipase within normal limits. I ordered imaging studies including CT Abd/Pelvis with contrast: evaluate for structural/surgical etiology of patients' severe abdominal pain.  I independently visualized and interpreted imaging and I agree with the radiologist interpretation of fecal impaction and sterile coral colitis. I personally viewed and interpreted the EKG/cardiac monitored which showed an underlying rhythm of: Sinus rhythm. Provided patient with enema and she is feeling better after passing a BM, but states that she might still feel something in her rectal area.  Shared all results with patient.  Answered all questions.  Educated patient that stercoral colitis requires IV antibiotics and hospital admission.  Patient verbalized understanding of plan and is agreeable. I have reviewed the patients home medicines and have made adjustments as needed Consulted with Dr. Adriana Hopping who agrees to admit patient.   Social Determinants of Health:  none          Final Clinical Impression(s) / ED Diagnoses Final diagnoses:  Stercoral colitis    Rx / DC Orders ED Discharge Orders     None         [place] Bureau, [place] 02/23/24 1854    Scarlette Currier, MD 02/24/24 1645

## 2024-02-23 NOTE — ED Triage Notes (Signed)
 Pt arrived by ems.  Pt then helped out of the bathroom in waiting and brought back to room 10.  Pt was on toilet, pale and diaphoretic and kept saying "help me, get the impaction out".  Pt states that she has had last bm Friday and has had impaction since today, she attempted to disimpact herself without success

## 2024-02-23 NOTE — ED Notes (Signed)
 Soap suds enema administered with moderate BM.

## 2024-02-23 NOTE — Progress Notes (Signed)
 Plan of Care Note for accepted transfer   Patient: Carrie [name] MRN: [id number]   DOA: 02/23/2024  Facility requesting transfer: [facility] Requesting Provider: Dorisann Garre, PA Reason for transfer: stercoral colitis Facility course: 63 year old with history of hyperlipidemia who presents with concern for impaction.  She denies significant history of constipation and reports not having a bowel movement for the last 2 days.  She reports significant pain desire for removal.  Patient denied fevers chills nausea vomiting.  She was given a soapsuds enema which was successful in creating a good bowel movement.  CT scanning revealed stercoral colitis and we were requested for admission.  Patient received Rocephin plus Flagyl while in the ED.  She has a white count of 10.7 and mild anemia.  Plan of care: The patient is accepted for admission and observation to Med-surg  unit, at [facility]..    Author: Granville Layer, MD 02/23/2024  Check [email or web address] for on-call coverage.  Nursing staff, Please call [facility] Admits & Consults System-Wide number on Amion as soon as patient's arrival, so appropriate admitting provider can evaluate the pt.

## 2024-02-23 NOTE — H&P (Signed)
 History and Physical      Carrie [name] YQM:[id number] DOB: 17-Feb-1961 DOA: 02/23/2024; DOS: 02/23/2024  PCP: Pcp, No (will further assess) Patient coming from: home   I have personally briefly reviewed patient's old medical records in [facility]  Chief Complaint: Abdominal pain  HPI: Carrie [name] is a 63 y.o. female with medical history significant for chronic iron deficiency anemia associated baseline hemoglobin 10-12, chronic diastolic heart failure, who is admitted to [facility] on 02/23/2024 by way of transfer from [facility] with stercoral colitis after presenting from home to the latter facility complaining of abdominal discomfort.  The patient reports 2 days of sharp, nonradiating abdominal discomfort, most prominent in the bilateral lower abdominal quadrants, worse with palpation.  She notes that this has been associated with new constipation, noting that last bowel movement occurred 2 to 3 days ago, representing a departure from her baseline bowel habits, which she reports having 1-2 well-formed stools per day at baseline.  No recent melena or hematochezia.  Denies any associated nausea, vomiting.  No recent subjective fever, chills, rigors, generalized myalgias.  No recent dysuria or gross hematuria.  Not on any opioids as an outpatient.  Not currently on any oral bowel regimen as an outpatient.  Continues to produce flatus.  Medical history is notable for chronic iron deficiency anemia associated baseline hemoglobin 10-12, for which she is on daily oral iron supplementation.  The patient notes some recent dietary changes over the last few months, including departing from her prior gluten-free diet, while also de-emphasizing fruits and fruit juices over the last few months.     [facility] ED Course:  Vital signs in the ED were notable for the following: Afebrile; heart rates in the 60s to 90s; systolic blood pressures in the 1 teens to 140s;  respiratory rate 15-22, oxygen saturation 95 to 100% on room air.  Labs were notable for the following: CMP was notable for the following: Potassium 4.0, creatinine 0.62 compared to most recent prior creatinine data point of 0.53 on 03/26/2023, BUN/creatinine ratio greater than 20, glucose 115, liver enzymes were within normal limits.  Lipase 27.  CBC notable for white cell count 10,700 with 89% neutrophils, hemoglobin 11.8 associated with microcytic finding and relative to most recent prior hemoglobin data point of 10.5 on 03/26/2023, platelet count 215.  Urinalysis has been ordered, Therazole currently pending.  Per my interpretation, EKG in ED demonstrated the following: Sinus rhythm with heart rate 63, normal intervals, no evidence of T wave or ST changes, including no evidence of ST elevation.  Imaging in the ED, per corresponding formal radiology read, was notable for the following: CT abdomen/pelvis without contrast showed evidence of constipation with fecal impaction at the level of the rectum, with some associated presacral edema suggestive of stercoral colitis in the absence of any evidence of bowel obstruction, perforation, or abscess.  EDP performed DRE and attempted manual disimpaction.  While in the ED, the following were administered: Soapsuds enema x 1, producing large bowel movement.  She also received morphine  4 mg IM x 1 dose, Zofran  4 mg ODT x 1, Rocephin, IV Flagyl.  Subsequently, the patient was admitted for further evaluation and management of sterile coral colitis in the setting of presenting constipation with fecal impaction, with clinical evidence of dehydration.    Review of Systems: As per HPI otherwise 10 point review of systems negative.   Past Medical History:  Diagnosis Date   Arthritis  DVT (deep venous thrombosis) (HCC)    High cholesterol    Kidney stones    Ovarian cyst    Pulmonary embolism [facility])     Past Surgical History:  Procedure Laterality Date    IR ANGIOGRAM PULMONARY BILATERAL SELECTIVE  01/28/2022   IR ANGIOGRAM SELECTIVE EACH ADDITIONAL VESSEL  01/28/2022   IR INFUSION THROMBOL ARTERIAL INITIAL (MS)  01/28/2022   IR INFUSION THROMBOL ARTERIAL INITIAL (MS)  01/28/2022   IR THROMB F/U EVAL ART/VEN FINAL DAY (MS)  01/29/2022   IR US  GUIDE VASC ACCESS RIGHT  01/28/2022   IR US  GUIDE VASC ACCESS RIGHT  01/28/2022   OVARIAN CYST REMOVAL      Social History:  reports that she has never smoked. She has never used smokeless tobacco. She reports that she does not drink alcohol and does not use drugs.   Allergies  Allergen Reactions   Iodinated Contrast Media Anaphylaxis   Ketamine Anaphylaxis   Gluten Meal     Makes stomach hurt also   Lactose Intolerance (Gi) Diarrhea and Nausea Only    Makes stomach hurt also   Other Swelling and Rash    Radioactive dyes, heavy metals like nickel   Cephalexin  Itching    Family History  Problem Relation Age of Onset   Pulmonary embolism Father     Family history reviewed and not pertinent    Prior to Admission medications   Medication Sig Start Date End Date Taking? Authorizing Provider  amoxicillin -clavulanate (AUGMENTIN ) 875-125 MG tablet Take 1 tablet by mouth every 12 (twelve) hours. 11/22/23   Levora Reas A, NP  ferrous sulfate  325 (65 FE) MG tablet Take 1 tablet (325 mg total) by mouth 2 (two) times daily with a meal for  30 days, then as directed by your physician. 03/27/23   Singh, Prashant K, MD  folic acid  (FOLVITE ) 1 MG tablet Take 1 tablet (1 mg total) by mouth daily. 03/27/23   Singh, Prashant K, MD  furosemide  (LASIX ) 40 MG tablet Take 1 tablet (40 mg total) by mouth daily. Patient not taking: Reported on 11/22/2023 03/28/23   Singh, Prashant K, MD  guaiFENesin (MUCINEX) 600 MG 12 hr tablet Take 600 mg by mouth 2 (two) times daily.    [provider]  potassium chloride  (KLOR-CON  M) 10 MEQ tablet Take 1 tablet (10 mEq total) by mouth daily. 03/28/23   Cala Castleman, MD   RIVAROXABAN  (XARELTO ) VTE STARTER PACK (15 & 20 MG) Take 15 mg by mouth every 12 (twelve) hours for 21 days, THEN 20 mg daily for 21 days. Follow package directions: Take one 15mg  tablet by mouth twice a day. On day 22, switch to one 20mg  tablet once a day. Take with food.. Patient not taking: Reported on 11/22/2023 04/03/23 05/15/23  Starlene Eaton, FNP  potassium chloride  (KLOR-CON ) 10 MEQ tablet Take 1 tablet (10 mEq total) by mouth daily for 7 days. 06/18/22 11/06/22  Kommor, Alyse July, MD     Objective    Physical Exam: Vitals:   02/23/24 1430 02/23/24 1600 02/23/24 1830 02/23/24 2038  BP: (!) 140/78 126/65 132/72 124/77  Pulse:  69 91 86  Resp: 17  20 15   Temp:   98.3 F (36.8 C) 98.2 F (36.8 C)  TempSrc:   Oral Oral  SpO2:  100% 95% 100%    General: appears to be stated age; alert, oriented Skin: warm, dry, no rash Head:  AT/[place] Mouth:  Oral mucosa membranes appear dry, normal dentition  Neck: supple; trachea midline Heart:  RRR; did not appreciate any M/R/G Lungs: CTAB, did not appreciate any wheezes, rales, or rhonchi Abdomen: + BS; soft, ND, mild tenderness in the bilateral lower abdominal quadrants, in the absence of any associated guarding, rigidity, or rebound tenderness. Vascular: 2+ pedal pulses b/l; 2+ radial pulses b/l Extremities: no peripheral edema, no muscle wasting Neuro: strength and sensation intact in upper and lower extremities b/l    Labs on Admission: I have personally reviewed following labs and imaging studies  CBC: Recent Labs  Lab 02/23/24 1645  WBC 10.7*  NEUTROABS 9.5*  HGB 11.8*  HCT 36.5  MCV 78.5*  PLT 215   Basic Metabolic Panel: Recent Labs  Lab 02/23/24 1645  NA 138  K 4.0  CL 105  CO2 19*  GLUCOSE 115*  BUN 22  CREATININE 0.62  CALCIUM 8.9   GFR: CrCl cannot be calculated (Unknown ideal weight.). Liver Function Tests: Recent Labs  Lab 02/23/24 1645  AST 15  ALT 14  ALKPHOS 61  BILITOT <0.2  PROT 6.3*   ALBUMIN 3.9   Recent Labs  Lab 02/23/24 1645  LIPASE 27   No results for input(s): "AMMONIA" in the last 168 hours. Coagulation Profile: No results for input(s): "INR", "PROTIME" in the last 168 hours. Cardiac Enzymes: No results for input(s): "CKTOTAL", "CKMB", "CKMBINDEX", "TROPONINI" in the last 168 hours. BNP (last 3 results) No results for input(s): "PROBNP" in the last 8760 hours. HbA1C: No results for input(s): "HGBA1C" in the last 72 hours. CBG: No results for input(s): "GLUCAP" in the last 168 hours. Lipid Profile: No results for input(s): "CHOL", "HDL", "LDLCALC", "TRIG", "CHOLHDL", "LDLDIRECT" in the last 72 hours. Thyroid Function Tests: No results for input(s): "TSH", "T4TOTAL", "FREET4", "T3FREE", "THYROIDAB" in the last 72 hours. Anemia Panel: No results for input(s): "VITAMINB12", "FOLATE", "FERRITIN", "TIBC", "IRON", "RETICCTPCT" in the last 72 hours. Urine analysis:    Component Value Date/Time   COLORURINE YELLOW 03/25/2023 1100   APPEARANCEUR CLEAR 03/25/2023 1100   LABSPEC 1.016 03/25/2023 1100   PHURINE 6.0 03/25/2023 1100   GLUCOSEU NEGATIVE 03/25/2023 1100   HGBUR NEGATIVE 03/25/2023 1100   BILIRUBINUR NEGATIVE 03/25/2023 1100   KETONESUR NEGATIVE 03/25/2023 1100   PROTEINUR NEGATIVE 03/25/2023 1100   UROBILINOGEN 0.2 01/13/2014 2105   NITRITE NEGATIVE 03/25/2023 1100   LEUKOCYTESUR NEGATIVE 03/25/2023 1100    Radiological Exams on Admission: CT ABDOMEN PELVIS WO CONTRAST Result Date: 02/23/2024 CLINICAL DATA:  Acute abdominal pain. EXAM: CT ABDOMEN AND PELVIS WITHOUT CONTRAST TECHNIQUE: Multidetector CT imaging of the abdomen and pelvis was performed following the standard protocol without IV contrast. RADIATION DOSE REDUCTION: This exam was performed according to the departmental dose-optimization program which includes automated exposure control, adjustment of the mA and/or kV according to patient size and/or use of iterative reconstruction  technique. COMPARISON:  CT abdomen and pelvis 07/20/2021 FINDINGS: Lower chest: No acute abnormality. Hepatobiliary: There are 2 cysts in the liver measuring up to 15 mm. Gallbladder and bile ducts are within normal limits. Pancreas: Unremarkable. No pancreatic ductal dilatation or surrounding inflammatory changes. Spleen: Normal in size without focal abnormality. Adrenals/Urinary Tract: Adrenal glands are unremarkable. Kidneys are normal, without renal calculi, focal lesion, or hydronephrosis. Bladder is unremarkable. Stomach/Bowel: The rectum is dilated and stool-filled measuring up to 9.5 cm. There is presacral edema. Overall, there is a large stool burden. There is no evidence for bowel obstruction, pneumatosis or free air. Appendix is small bowel are within normal limits. There is  a small to moderate-sized hiatal hernia. Stomach is otherwise within normal limits. Vascular/Lymphatic: No significant vascular findings are present. No enlarged abdominal or pelvic lymph nodes. Reproductive: Exophytic fibroid is present from the uterus measuring 15 mm. Adnexa are within normal limits. Other: No abdominal wall hernia or abnormality. No abdominopelvic ascites. Musculoskeletal: No acute or significant osseous findings. IMPRESSION: 1. Findings compatible with fecal impaction. There is presacral edema which may represent stercoral colitis. 2. Small to moderate-sized hiatal hernia. 3. Uterine fibroid.  Are Electronically Signed   By: Tyron Gallon M.D.   On: 02/23/2024 15:43      Assessment/Plan    Principal Problem:   Stercoral colitis Active Problems:   Abdominal pain   Constipation   Dehydration   Acute prerenal azotemia   Chronic diastolic CHF (congestive heart failure) (HCC)   Chronic iron deficiency anemia    #) stercoral colitis: In the setting of recent constipation and presenting abdominal discomfort, CT abdomen/pelvis shows evidence of presacral edema suggestive of stercoral colitis, without  any evidence of bowel obstruction, perforation, or abscess.  She has very mild elevation in her white blood cell count to 10,700, which does not meet quantitative threshold of 12,000 for inclusion in SIRS criteria.  Therefore, in the absence of white blood cell count greater than 12,000 and in the absence of objective fever, SIRS criteria for sepsis are not met at this time.  She appears hemodynamically stable.  Will continue the Rocephin and IV Flagyl that were initiated at [facility] earlier this evening.   Will continue to address the constipation, fecal impaction leading to her stercoral colitis.  It is noted that manual disimpaction was attempted by EDP at [facility], and that a significant bowel movement was produced via soapsuds enema, as further detailed above.  Will continue to address her constipation with distal and proximal to the level of constipation, as further detailed below.  No evidence of acute peritoneal findings on exam.  Plan: Dulcolax suppository x 1 now.  Will repeat soapsuds enema in the morning.  Scheduled MiraLAX and Colace on twice daily basis, first doses now.  Full liquid diet.  Lactated Ringer's at 100 cc/h x 12 hours.  As needed IV fentanyl.  Repeat CMP, CBC in the morning.  Monitor strict I's and O's and daily weights.                       #) Dehydration: Clinical suspicion for such, including the appearance of dry oral mucous membranes as well as laboratory findings notable for acute prerenal azotemia. UA currently pending.  Appears to be in the setting of   decline in oral intake over the last 2 days in the context of her presenting constipation.  No e/o associated hypotension.     Plan: Monitor strict I's and O's.  Daily weights.  CMP in the morning. IVF's in form of lactated Ringer's at 100 cc/h x 12 hours.Aaron Aas                     #) Constipation: Patient reports constipation over the last 2 to 3 days, which is  a new finding for her, per her reported baseline bowel habits as outlined above, with today CT abdomen/pelvis showing evidence of constipation as well as evidence of fecal impaction at the level of the rectum, which appears to have resulted in her stercoral colitis without any evidence of bowel obstruction, perforation, or abscess.  As noted above, manual disimpaction was attempted by EDP at [facility], and a large bowel movement was produced by soapsuds enema.  Will continue to address her constipation both distal and proximal to the level of constipation, as outlined below.  In terms of potential factors contributing to her constipation, suspect contribution from clinical evidence of dehydration, as above.  There may also be a contribution from the constipating side effects of her chronic iron supplementation.  Does not appear to be on any routine opioid pain medications.  Will also check TSH level.  Plan: Dulcolax suppository x 1 now.  Repeat soapsuds enema in the morning.  Scheduled MiraLAX and Colace on a twice daily basis, first dose to occur now.  IV fluids, as above.  Check TSH.  Further evaluation management of resultant sterile coral colitis, as above.                      #) Chronic diastolic heart failure: documented history of such, with most recent echocardiogram performed in June 2024, which was notable for LVEF 55 to 60%, no evidence of focal wall motion values, grade 1 diastolic dysfunction, normal right ventricular systolic function and no evidence of significant valvular pathology. No clinical evidence to suggest acutely decompensated heart failure at this time. home diuretic regimen reportedly consists of the following: None.   Plan: monitor strict I's & O's and daily weights. Repeat CMP in AM. Check serum mag level.  Check BNP.                  #) Chronic iron deficiency anemia: Documented history of such, a/w with baseline hgb range 10-12,  with presenting hgb consistent with this range, in the absence of any overt evidence of active bleed.  On daily oral iron supplementation as an outpatient, which is notable given her presenting constipation, as above.  Given her presenting constipation associated with fecal impaction resulting in stercoral colitis, will hold next dose of iron.   Plan: Repeat CBC in the morning.  Hold next dose of oral iron, as above.         DVT prophylaxis: SCD's   Code Status: Full code Family Communication: none Disposition Plan: Per Rounding Team Consults called: none;  Admission status: Observation    I SPENT GREATER THAN 75  MINUTES IN CLINICAL CARE TIME/MEDICAL DECISION-MAKING IN COMPLETING THIS ADMISSION.     [place] Dani B Jossie Smoot DO [facility] From 7PM - 7AM   02/23/2024, 8:47 PM

## 2024-02-23 NOTE — ED Notes (Signed)
 [facility] called for transport.

## 2024-02-23 NOTE — Progress Notes (Signed)
 Placed call to [facility] admitting services to inform them of pt's arrival to room 1302.

## 2024-02-23 NOTE — ED Notes (Signed)
 Patient transported to CT

## 2024-02-24 LAB — CBC WITH DIFFERENTIAL/PLATELET
Abs Immature Granulocytes: 0.04 10*3/uL (ref 0.00–0.07)
Basophils Absolute: 0 10*3/uL (ref 0.0–0.1)
Basophils Relative: 0 %
Eosinophils Absolute: 0 10*3/uL (ref 0.0–0.5)
Eosinophils Relative: 0 %
HCT: 35.4 % — ABNORMAL LOW (ref 36.0–46.0)
Hemoglobin: 11 g/dL — ABNORMAL LOW (ref 12.0–15.0)
Immature Granulocytes: 0 %
Lymphocytes Relative: 8 %
Lymphs Abs: 0.7 10*3/uL (ref 0.7–4.0)
MCH: 24.9 pg — ABNORMAL LOW (ref 26.0–34.0)
MCHC: 31.1 g/dL (ref 30.0–36.0)
MCV: 80.3 fL (ref 80.0–100.0)
Monocytes Absolute: 0.9 10*3/uL (ref 0.1–1.0)
Monocytes Relative: 10 %
Neutro Abs: 7.6 10*3/uL (ref 1.7–7.7)
Neutrophils Relative %: 82 %
Platelets: 208 10*3/uL (ref 150–400)
RBC: 4.41 MIL/uL (ref 3.87–5.11)
RDW: 17.3 % — ABNORMAL HIGH (ref 11.5–15.5)
WBC: 9.3 10*3/uL (ref 4.0–10.5)
nRBC: 0 % (ref 0.0–0.2)

## 2024-02-24 LAB — COMPREHENSIVE METABOLIC PANEL WITH GFR
ALT: 15 U/L (ref 0–44)
AST: 16 U/L (ref 15–41)
Albumin: 3.3 g/dL — ABNORMAL LOW (ref 3.5–5.0)
Alkaline Phosphatase: 48 U/L (ref 38–126)
Anion gap: 7 (ref 5–15)
BUN: 16 mg/dL (ref 8–23)
CO2: 23 mmol/L (ref 22–32)
Calcium: 8.5 mg/dL — ABNORMAL LOW (ref 8.9–10.3)
Chloride: 110 mmol/L (ref 98–111)
Creatinine, Ser: 0.54 mg/dL (ref 0.44–1.00)
GFR, Estimated: >60 mL/min (ref >60)
Glucose, Bld: 129 mg/dL — ABNORMAL HIGH (ref 70–99)
Potassium: 3.6 mmol/L (ref 3.5–5.1)
Sodium: 140 mmol/L (ref 135–145)
Total Bilirubin: 0.6 mg/dL (ref 0.0–1.2)
Total Protein: 6.1 g/dL — ABNORMAL LOW (ref 6.5–8.1)

## 2024-02-24 LAB — MAGNESIUM: Magnesium: 2 mg/dL (ref 1.7–2.4)

## 2024-02-24 LAB — TSH: TSH: 2.311 u[IU]/mL (ref 0.350–4.500)

## 2024-02-24 LAB — BRAIN NATRIURETIC PEPTIDE: B Natriuretic Peptide: 55 pg/mL (ref 0.0–100.0)

## 2024-02-24 MED ORDER — HEPARIN SODIUM (PORCINE) 5000 UNIT/ML IJ SOLN
5000.0000 [IU] | Freq: Two times a day (BID) | INTRAMUSCULAR | Status: DC
  Administered 2024-02-24 – 2024-02-25 (×3): 5000 [IU] via SUBCUTANEOUS
  Filled 2024-02-24 (×3): qty 1

## 2024-02-24 MED ORDER — TORSEMIDE 20 MG PO TABS
20.0000 mg | ORAL_TABLET | Freq: Every day | ORAL | Status: DC
  Administered 2024-02-24 – 2024-02-25 (×2): 20 mg via ORAL
  Filled 2024-02-24 (×2): qty 1

## 2024-02-24 MED ORDER — SACCHAROMYCES BOULARDII 250 MG PO CAPS
250.0000 mg | ORAL_CAPSULE | Freq: Two times a day (BID) | ORAL | Status: DC
  Administered 2024-02-24 – 2024-02-25 (×2): 250 mg via ORAL
  Filled 2024-02-24 (×2): qty 1

## 2024-02-24 NOTE — Plan of Care (Signed)

## 2024-02-24 NOTE — Progress Notes (Signed)
 Mobility Specialist - Progress Note   02/24/24 0836  Mobility  Activity Ambulated independently in hallway  Level of Assistance Independent  Assistive Device None  Distance Ambulated (ft) 250 ft  Activity Response Tolerated well  Mobility Referral Yes  Mobility visit 1 Mobility  Mobility Specialist Start Time (ACUTE ONLY) [id number]  Mobility Specialist Stop Time (ACUTE ONLY) 0836  Mobility Specialist Time Calculation (min) (ACUTE ONLY) 10 min   Pt received in bed and agreeable to mobility. No complaints during session. Pt to bed after session with all needs met.    [facility]

## 2024-02-24 NOTE — Progress Notes (Signed)
 Administered soap suds enema , pt had couple of small BM after the enema.

## 2024-02-24 NOTE — Progress Notes (Signed)
 PROGRESS NOTE    Carrie [name]  ZOX:[id number] DOB: 1961/04/10 DOA: 02/23/2024 PCP: Pcp, No  Outpatient Specialists:     Brief Narrative:  Patient is a 63 year old female, obese, with past medical history significant for chronic iron deficiency anemia, chronic diastolic CHF, DVT, pulmonary embolism, hyperlipidemia, ovarian cyst and nephrolithiasis.  Patient was admitted with stercoral colitis.  Apparently, patient presented with abdominal pain and constipation.  No associated nausea or vomiting.  Patient has had enema with good result.  Patient is currently on IV Rocephin, Flagyl and oral MiraLAX.  02/24/2024: Patient seen alongside patient's nurse.  Patient is slowly improving.  Diet is being advanced.   Assessment & Plan:   Principal Problem:   Stercoral colitis Active Problems:   Abdominal pain   Constipation   Dehydration   Acute prerenal azotemia   Chronic diastolic CHF (congestive heart failure) (HCC)   Chronic iron deficiency anemia   Stercoral colitis: - See above documentation. - CT abdomen and pelvis revealed presacral edema suggestive of stercoral colitis.  No evidence of bowel obstruction, perforation or abscess. - Continue IV antibiotics. - Continue MiraLAX. - Further management depend on hospital course.  Constipation: - Patient has had enema. - Continue MiraLAX.  Obesity: - Diet and exercise. - Moderate (class II) obesity  Chronic diastolic CHF: - Seems Compensated. - Bilateral lower extremity edema noted. - Start torsemide 20 Mg p.o. once daily.  History of DVT/PE: - Start subcutaneous heparin  for DVT prophylaxis.  Anemia: - Hemoglobin of 11 g/dL. - MCV of 80.3. -Known to have iron deficiency anemia (as per prior documentation) - Continue to monitor. -Add laxative anytime patient is on iron.  Hyperlipidemia: Will defer to PCP Nephrolithiasis: No symptoms.  DVT prophylaxis: Subcutaneous heparin  Code Status: Full code Family Communication:   Disposition Plan: Inpatient   Consultants:  None  Procedures:  None  Antimicrobials:  IV Rocephin. IV Flagyl   Subjective: -Patient is improving.  Objective: Vitals:   02/24/24 0032 02/24/24 0416 02/24/24 0500 02/24/24 0930  BP: 112/60 121/67  133/69  Pulse: 81 83  72  Resp: 16 16  18   Temp: 98.5 F (36.9 C) 98.7 F (37.1 C)  (!) 97.3 F (36.3 C)  TempSrc: Oral Oral  Oral  SpO2: 96% 96%  97%  Weight:   97.4 kg     Intake/Output Summary (Last 24 hours) at 02/24/2024 1038 Last data filed at 02/24/2024 0900 Gross per 24 hour  Intake 1084.16 ml  Output 1000 ml  Net 84.16 ml   Filed Weights   02/24/24 0500  Weight: 97.4 kg    Examination:  General exam: Appears calm and comfortable.  Patient is obese. Respiratory system: Clear to auscultation.  Cardiovascular system: S1 & S2 heard Gastrointestinal system: Abdomen is obese, soft and nontender  Central nervous system: Alert and oriented. No focal neurological deficits. Extremities: Bilateral lower extremity edema.  Data Reviewed: I have personally reviewed following labs and imaging studies  CBC: Recent Labs  Lab 02/23/24 1645 02/24/24 0430  WBC 10.7* 9.3  NEUTROABS 9.5* 7.6  HGB 11.8* 11.0*  HCT 36.5 35.4*  MCV 78.5* 80.3  PLT 215 208   Basic Metabolic Panel: Recent Labs  Lab 02/23/24 1645 02/24/24 0430  NA 138 140  K 4.0 3.6  CL 105 110  CO2 19* 23  GLUCOSE 115* 129*  BUN 22 16  CREATININE 0.62 0.54  CALCIUM 8.9 8.5*  MG  --  2.0   GFR: Estimated Creatinine Clearance: 81 mL/min (by C-G  formula based on SCr of 0.54 mg/dL). Liver Function Tests: Recent Labs  Lab 02/23/24 1645 02/24/24 0430  AST 15 16  ALT 14 15  ALKPHOS 61 48  BILITOT <0.2 0.6  PROT 6.3* 6.1*  ALBUMIN 3.9 3.3*   Recent Labs  Lab 02/23/24 1645  LIPASE 27   No results for input(s): "AMMONIA" in the last 168 hours. Coagulation Profile: No results for input(s): "INR", "PROTIME" in the last 168 hours. Cardiac  Enzymes: No results for input(s): "CKTOTAL", "CKMB", "CKMBINDEX", "TROPONINI" in the last 168 hours. BNP (last 3 results) No results for input(s): "PROBNP" in the last 8760 hours. HbA1C: No results for input(s): "HGBA1C" in the last 72 hours. CBG: No results for input(s): "GLUCAP" in the last 168 hours. Lipid Profile: No results for input(s): "CHOL", "HDL", "LDLCALC", "TRIG", "CHOLHDL", "LDLDIRECT" in the last 72 hours. Thyroid Function Tests: Recent Labs    02/24/24 0430  TSH 2.311   Anemia Panel: No results for input(s): "VITAMINB12", "FOLATE", "FERRITIN", "TIBC", "IRON", "RETICCTPCT" in the last 72 hours. Urine analysis:    Component Value Date/Time   COLORURINE YELLOW 03/25/2023 1100   APPEARANCEUR CLEAR 03/25/2023 1100   LABSPEC 1.016 03/25/2023 1100   PHURINE 6.0 03/25/2023 1100   GLUCOSEU NEGATIVE 03/25/2023 1100   HGBUR NEGATIVE 03/25/2023 1100   BILIRUBINUR NEGATIVE 03/25/2023 1100   KETONESUR NEGATIVE 03/25/2023 1100   PROTEINUR NEGATIVE 03/25/2023 1100   UROBILINOGEN 0.2 01/13/2014 2105   NITRITE NEGATIVE 03/25/2023 1100   LEUKOCYTESUR NEGATIVE 03/25/2023 1100   Sepsis Labs: @LABRCNTIP (procalcitonin:4,lacticidven:4)  )No results found for this or any previous visit (from the past 240 hours).       Radiology Studies: CT ABDOMEN PELVIS WO CONTRAST Result Date: 02/23/2024 CLINICAL DATA:  Acute abdominal pain. EXAM: CT ABDOMEN AND PELVIS WITHOUT CONTRAST TECHNIQUE: Multidetector CT imaging of the abdomen and pelvis was performed following the standard protocol without IV contrast. RADIATION DOSE REDUCTION: This exam was performed according to the departmental dose-optimization program which includes automated exposure control, adjustment of the mA and/or kV according to patient size and/or use of iterative reconstruction technique. COMPARISON:  CT abdomen and pelvis 07/20/2021 FINDINGS: Lower chest: No acute abnormality. Hepatobiliary: There are 2 cysts in the liver  measuring up to 15 mm. Gallbladder and bile ducts are within normal limits. Pancreas: Unremarkable. No pancreatic ductal dilatation or surrounding inflammatory changes. Spleen: Normal in size without focal abnormality. Adrenals/Urinary Tract: Adrenal glands are unremarkable. Kidneys are normal, without renal calculi, focal lesion, or hydronephrosis. Bladder is unremarkable. Stomach/Bowel: The rectum is dilated and stool-filled measuring up to 9.5 cm. There is presacral edema. Overall, there is a large stool burden. There is no evidence for bowel obstruction, pneumatosis or free air. Appendix is small bowel are within normal limits. There is a small to moderate-sized hiatal hernia. Stomach is otherwise within normal limits. Vascular/Lymphatic: No significant vascular findings are present. No enlarged abdominal or pelvic lymph nodes. Reproductive: Exophytic fibroid is present from the uterus measuring 15 mm. Adnexa are within normal limits. Other: No abdominal wall hernia or abnormality. No abdominopelvic ascites. Musculoskeletal: No acute or significant osseous findings. IMPRESSION: 1. Findings compatible with fecal impaction. There is presacral edema which may represent stercoral colitis. 2. Small to moderate-sized hiatal hernia. 3. Uterine fibroid.  Are Electronically Signed   By: Tyron Gallon M.D.   On: 02/23/2024 15:43        Scheduled Meds:  docusate sodium  100 mg Oral BID   polyethylene glycol  17 g Oral  BID   Continuous Infusions:  cefTRIAXone (ROCEPHIN)  IV     metronidazole 500 mg (02/24/24 0535)     LOS: 0 days    Time spent: 35 minutes    Fonnie Iba, MD  [facility] Pager #: [phone] 7PM-7AM contact night coverage as above

## 2024-02-24 NOTE — Progress Notes (Signed)
   02/24/24 1110  TOC Brief Assessment  Insurance and Status Lapsed (uninsured)  Patient has primary care physician No  Home environment has been reviewed home alone  Prior level of function: independent  Prior/Current Home Services No current home services  Social Drivers of Health Review SDOH reviewed no interventions necessary  Readmission risk has been reviewed Yes  Transition of care needs no transition of care needs at this time   Met with pt and discussed chart indicates no PCP.  Pt reports that she typically is seen as needed at the [facility] with fee adjusted costs and prefers to continue with this practice.  Agreeable with CSW adding [facility] clinic info to AVS should she decide to pursue one of those practices.   Pt anticipates dc today and no significant dc meds cost.  (Of note, pt had accessed [facility] in June 2024 so not available for her with this admission.)  No TOC needs noted.

## 2024-02-25 LAB — CBC WITH DIFFERENTIAL/PLATELET
Abs Immature Granulocytes: 0.02 10*3/uL (ref 0.00–0.07)
Basophils Absolute: 0.1 10*3/uL (ref 0.0–0.1)
Basophils Relative: 1 %
Eosinophils Absolute: 0.1 10*3/uL (ref 0.0–0.5)
Eosinophils Relative: 1 %
HCT: 40.2 % (ref 36.0–46.0)
Hemoglobin: 11.8 g/dL — ABNORMAL LOW (ref 12.0–15.0)
Immature Granulocytes: 0 %
Lymphocytes Relative: 27 %
Lymphs Abs: 2 10*3/uL (ref 0.7–4.0)
MCH: 25.1 pg — ABNORMAL LOW (ref 26.0–34.0)
MCHC: 29.4 g/dL — ABNORMAL LOW (ref 30.0–36.0)
MCV: 85.4 fL (ref 80.0–100.0)
Monocytes Absolute: 0.8 10*3/uL (ref 0.1–1.0)
Monocytes Relative: 11 %
Neutro Abs: 4.4 10*3/uL (ref 1.7–7.7)
Neutrophils Relative %: 60 %
Platelets: 219 10*3/uL (ref 150–400)
RBC: 4.71 MIL/uL (ref 3.87–5.11)
RDW: 17.8 % — ABNORMAL HIGH (ref 11.5–15.5)
WBC: 7.2 10*3/uL (ref 4.0–10.5)
nRBC: 0 % (ref 0.0–0.2)

## 2024-02-25 LAB — MAGNESIUM: Magnesium: 2 mg/dL (ref 1.7–2.4)

## 2024-02-25 LAB — RENAL FUNCTION PANEL
Albumin: 3.7 g/dL (ref 3.5–5.0)
Anion gap: 11 (ref 5–15)
BUN: 12 mg/dL (ref 8–23)
CO2: 25 mmol/L (ref 22–32)
Calcium: 8.6 mg/dL — ABNORMAL LOW (ref 8.9–10.3)
Chloride: 105 mmol/L (ref 98–111)
Creatinine, Ser: 0.72 mg/dL (ref 0.44–1.00)
GFR, Estimated: >60 mL/min (ref >60)
Glucose, Bld: 83 mg/dL (ref 70–99)
Phosphorus: 2.8 mg/dL (ref 2.5–4.6)
Potassium: 3.9 mmol/L (ref 3.5–5.1)
Sodium: 141 mmol/L (ref 135–145)

## 2024-02-25 MED ORDER — AMOXICILLIN-POT CLAVULANATE 875-125 MG PO TABS: 1.0000 | ORAL_TABLET | Freq: Two times a day (BID) | ORAL | 0 refills | Status: DC

## 2024-02-25 MED ORDER — DOCUSATE SODIUM 100 MG PO CAPS: 100.0000 mg | ORAL_CAPSULE | Freq: Every evening | ORAL | 0 refills | Status: AC | PRN

## 2024-02-25 MED ORDER — AMOXICILLIN-POT CLAVULANATE 875-125 MG PO TABS
1.0000 | ORAL_TABLET | Freq: Two times a day (BID) | ORAL | Status: DC
  Administered 2024-02-25: 1 via ORAL
  Filled 2024-02-25: qty 1

## 2024-02-25 MED ORDER — AMOXICILLIN-POT CLAVULANATE 875-125 MG PO TABS: 1.0000 | ORAL_TABLET | Freq: Two times a day (BID) | ORAL | 0 refills | Status: AC

## 2024-02-25 MED ORDER — SENNOSIDES-DOCUSATE SODIUM 8.6-50 MG PO TABS: 1.0000 | ORAL_TABLET | Freq: Every day | ORAL | 0 refills | Status: AC

## 2024-02-25 MED ORDER — SACCHAROMYCES BOULARDII 250 MG PO CAPS: 250.0000 mg | ORAL_CAPSULE | Freq: Two times a day (BID) | ORAL | 0 refills | Status: DC

## 2024-02-25 MED ORDER — DOCUSATE SODIUM 100 MG PO CAPS: 100.0000 mg | ORAL_CAPSULE | Freq: Every evening | ORAL | 0 refills | Status: DC | PRN

## 2024-02-25 MED ORDER — SENNOSIDES-DOCUSATE SODIUM 8.6-50 MG PO TABS: 1.0000 | ORAL_TABLET | Freq: Every day | ORAL | 0 refills | Status: DC

## 2024-02-25 MED ORDER — TORSEMIDE 20 MG PO TABS: 20.0000 mg | ORAL_TABLET | Freq: Every day | ORAL | 0 refills | Status: DC

## 2024-02-25 MED ORDER — TORSEMIDE 20 MG PO TABS: 20.0000 mg | ORAL_TABLET | Freq: Every day | ORAL | 0 refills | Status: AC

## 2024-02-25 MED ORDER — SACCHAROMYCES BOULARDII 250 MG PO CAPS: 250.0000 mg | ORAL_CAPSULE | Freq: Two times a day (BID) | ORAL | 0 refills | Status: AC

## 2024-02-25 NOTE — Plan of Care (Signed)

## 2024-02-25 NOTE — Discharge Summary (Signed)
 Physician Discharge Summary   Patient: Carrie [name] MRN: [id number] DOB: [birth year]  Admit date:     02/23/2024  Discharge date: 02/25/24  Discharge Physician: Lorita Rosa   PCP: Pcp, No   Recommendations at discharge:   Complete antibiotics (Augmentin  twice daily to complete 7 total days, through 5/10) Take Senokot-S each morning and add Colace each evening if needed to maintain normal bowel movements Torsemide added for daily use for lower extremity edema Follow up with PCP; referral placed  Discharge Diagnoses: Principal Problem:   Stercoral colitis Active Problems:   Abdominal pain   Constipation   Dehydration   Acute prerenal azotemia   Chronic diastolic CHF (congestive heart failure) (HCC)   Chronic iron deficiency anemia    Hospital Course: 62yo with h/o chronic IDA (Hgb 10-12) and chronic diastolic CHF who presented on 5/4 with abdominal pain and was found to have stercoral colitis.  She was started on IV antibiotics but is refusing IV and wants PO instead.  Soapsuds enema with large BM, on scheduled Miralax and Colace.  Assessment and Plan:  Stercoral colitis Presented with abdominal pain CT abdomen and pelvis revealed presacral edema suggestive of stercoral colitis with no evidence of bowel obstruction, perforation or abscess Continue antibiotics (Cipro /Flagyl -> Augmentin ) to complete 7 total days of therapy Needs good bowel regimen   Constipation Patient has had enema with good result Continue Senokot-S daily and add Colace at bedtime if needed   Class 2 obesity Body mass index is 38.04 kg/m.Aaron Aas  Weight loss should be encouraged Outpatient PCP/bariatric medicine f/u encouraged Significantly low or high BMI is associated with higher medical risk including morbidity and mortality    Chronic diastolic CHF Compensated Bilateral lower extremity edema noted on admission, improving Started torsemide 20 Mg daily   History of DVT/PE No longer on blood  thinners Takes ASA 1-2x/week   Anemia Hemoglobin of 11.8 Normocytic Known to have iron deficiency anemia (as per prior documentation)   Hyperlipidemia Will defer to PCP  Nephrolithiasis No symptoms   Consultants: TOC team   Procedures: None   Antibiotics: Ceftriaxone 5/4-6 Metronidazole 5/4-6   Pain control -   Controlled Substance Reporting System database was reviewed. and patient was instructed, not to drive, operate heavy machinery, perform activities at heights, swimming or participation in water activities or provide baby-sitting services while on Pain, Sleep and Anxiety Medications; until their outpatient Physician has advised to do so again. Also recommended to not to take more than prescribed Pain, Sleep and Anxiety Medications.    Disposition: Home Diet recommendation:  Regular diet DISCHARGE MEDICATION: Allergies as of 02/25/2024       Reactions   Iodinated Contrast Media Anaphylaxis   Ketamine Anaphylaxis   Gluten Meal    Makes stomach hurt also   Lactose Intolerance (gi) Diarrhea, Nausea Only   Makes stomach hurt also   Other Swelling, Rash   Radioactive dyes, heavy metals like nickel   Cephalexin  Itching        Medication List     TAKE these medications    amoxicillin -clavulanate 875-125 MG tablet Commonly known as: AUGMENTIN  Take 1 tablet by mouth every 12 (twelve) hours for 4 days.   aspirin  EC 81 MG tablet Take 81 mg by mouth See admin instructions. Take 81mg  (1 tablet) by mouth one to two times a week.   docusate sodium 100 MG capsule Commonly known as: COLACE Take 1 capsule (100 mg total) by mouth at bedtime as needed for mild constipation.  ibuprofen  200 MG tablet Commonly known as: ADVIL  Take 200-800 mg by mouth every 6 (six) hours as needed for moderate pain (pain score 4-6).   saccharomyces boulardii 250 MG capsule Commonly known as: FLORASTOR Take 1 capsule (250 mg total) by mouth 2 (two) times daily.    senna-docusate 8.6-50 MG tablet Commonly known as: Senokot-S Take 1 tablet by mouth daily.   torsemide 20 MG tablet Commonly known as: DEMADEX Take 1 tablet (20 mg total) by mouth daily. Start taking on: Feb 26, 2024        Follow-up Information     [email or web address] Follow up.   Why: visit website for information on local [place] clinics               Discharge Exam:    Subjective: Feeling better. Wants to go home.  No concerns.   Objective: Vitals:   02/24/24 2032 02/25/24 0504  BP: 115/76 135/65  Pulse: 92 75  Resp: 18 18  Temp: (!) 97.5 F (36.4 C) 98 F (36.7 C)  SpO2: 93% 96%    Intake/Output Summary (Last 24 hours) at 02/25/2024 1146 Last data filed at 02/25/2024 1000 Gross per 24 hour  Intake 2040 ml  Output 0 ml  Net 2040 ml   Filed Weights   02/24/24 0500  Weight: 97.4 kg    Exam:  General:  Appears calm and comfortable and is in NAD, dressed and standing Eyes:  EOMI, normal lids, iris ENT:  grossly normal hearing, lips & tongue, mmm Neck:  no LAD, masses or thyromegaly Cardiovascular:  RRR, no m/r/g. No LE edema.  Respiratory:   CTA bilaterally with no wheezes/rales/rhonchi.  Normal respiratory effort. Abdomen:  soft, NT, ND Skin:  no rash or induration seen on limited exam Musculoskeletal:  grossly normal tone BUE/BLE, good ROM, no bony abnormality Psychiatric:  grossly normal mood and affect, speech fluent and appropriate, AOx3 Neurologic:  CN 2-12 grossly intact, moves all extremities in coordinated fashion  Data Reviewed: I have reviewed the patient's lab results since admission.  Pertinent labs for today include:  Stable renal panel Stable CBC TSH 2.311    Condition at discharge: stable  The results of significant diagnostics from this hospitalization (including imaging, microbiology, ancillary and laboratory) are listed below for reference.   Imaging Studies: CT ABDOMEN PELVIS WO CONTRAST Result Date:  02/23/2024 CLINICAL DATA:  Acute abdominal pain. EXAM: CT ABDOMEN AND PELVIS WITHOUT CONTRAST TECHNIQUE: Multidetector CT imaging of the abdomen and pelvis was performed following the standard protocol without IV contrast. RADIATION DOSE REDUCTION: This exam was performed according to the departmental dose-optimization program which includes automated exposure control, adjustment of the mA and/or kV according to patient size and/or use of iterative reconstruction technique. COMPARISON:  CT abdomen and pelvis 07/20/2021 FINDINGS: Lower chest: No acute abnormality. Hepatobiliary: There are 2 cysts in the liver measuring up to 15 mm. Gallbladder and bile ducts are within normal limits. Pancreas: Unremarkable. No pancreatic ductal dilatation or surrounding inflammatory changes. Spleen: Normal in size without focal abnormality. Adrenals/Urinary Tract: Adrenal glands are unremarkable. Kidneys are normal, without renal calculi, focal lesion, or hydronephrosis. Bladder is unremarkable. Stomach/Bowel: The rectum is dilated and stool-filled measuring up to 9.5 cm. There is presacral edema. Overall, there is a large stool burden. There is no evidence for bowel obstruction, pneumatosis or free air. Appendix is small bowel are within normal limits. There is a small to moderate-sized hiatal hernia. Stomach is otherwise within normal limits. Vascular/Lymphatic: No significant  vascular findings are present. No enlarged abdominal or pelvic lymph nodes. Reproductive: Exophytic fibroid is present from the uterus measuring 15 mm. Adnexa are within normal limits. Other: No abdominal wall hernia or abnormality. No abdominopelvic ascites. Musculoskeletal: No acute or significant osseous findings. IMPRESSION: 1. Findings compatible with fecal impaction. There is presacral edema which may represent stercoral colitis. 2. Small to moderate-sized hiatal hernia. 3. Uterine fibroid.  Are Electronically Signed   By: Tyron Gallon M.D.   On:  02/23/2024 15:43    Microbiology: Results for orders placed or performed during the hospital encounter of 11/06/22  SARS CORONAVIRUS 2 (TAT 6-24 HRS) Anterior Nasal Swab     Status: None   Collection Time: 11/06/22  3:07 PM   Specimen: Anterior Nasal Swab  Result Value Ref Range Status   SARS Coronavirus 2 NEGATIVE NEGATIVE Final    Comment: (NOTE) SARS-CoV-2 target nucleic acids are NOT DETECTED.  The SARS-CoV-2 RNA is generally detectable in upper and lower respiratory specimens during the acute phase of infection. Negative results do not preclude SARS-CoV-2 infection, do not rule out co-infections with other pathogens, and should not be used as the sole basis for treatment or other patient management decisions. Negative results must be combined with clinical observations, patient history, and epidemiological information. The expected result is Negative.  Fact Sheet for Patients: HairSlick.no  Fact Sheet for Healthcare Providers: quierodirigir.com  This test is not yet approved or cleared by the [place]  FDA and  has been authorized for detection and/or diagnosis of SARS-CoV-2 by FDA under an Emergency Use Authorization (EUA). This EUA will remain  in effect (meaning this test can be used) for the duration of the COVID-19 declaration under Se ction 564(b)(1) of the Act, 21 U.S.C. section 360bbb-3(b)(1), unless the authorization is terminated or revoked sooner.  Performed at [facility], [address], [place], [place] [address]     Labs: CBC: Recent Labs  Lab 02/23/24 1645 02/24/24 0430 02/25/24 0508  WBC 10.7* 9.3 7.2  NEUTROABS 9.5* 7.6 4.4  HGB 11.8* 11.0* 11.8*  HCT 36.5 35.4* 40.2  MCV 78.5* 80.3 85.4  PLT 215 208 219   Basic Metabolic Panel: Recent Labs  Lab 02/23/24 1645 02/24/24 0430 02/25/24 0508  NA 138 140 141  K 4.0 3.6 3.9  CL 105 110 105  CO2 19* 23 25  GLUCOSE 115* 129*  83  BUN 22 16 12   CREATININE 0.62 0.54 0.72  CALCIUM 8.9 8.5* 8.6*  MG  --  2.0 2.0  PHOS  --   --  2.8   Liver Function Tests: Recent Labs  Lab 02/23/24 1645 02/24/24 0430 02/25/24 0508  AST 15 16  --   ALT 14 15  --   ALKPHOS 61 48  --   BILITOT <0.2 0.6  --   PROT 6.3* 6.1*  --   ALBUMIN 3.9 3.3* 3.7   CBG: No results for input(s): "GLUCAP" in the last 168 hours.  Discharge time spent: greater than 30 minutes.  Signed: Lorita Rosa, MD [facility] 02/25/2024

## 2024-02-25 NOTE — Progress Notes (Signed)
 At bedside for PIV insertion. Pt very apprehensive regarding placement. States, " I would prefer to try orals first. IV sticks have been very painful." Explained that U/S would be utilized to locate the vessel without success. Pt to speak with provider for possible PO medications. RN aware.

## 2024-02-25 NOTE — Hospital Course (Signed)
 62yo with h/o chronic IDA (Hgb 10-12) and chronic diastolic CHF who presented on 5/4 with abdominal pain and was found to have stercoral colitis.  She was started on IV antibiotics but is refusing IV and wants PO instead.  Soapsuds enema with large BM, on scheduled Miralax and Colace.

## 2024-03-14 ENCOUNTER — Other Ambulatory Visit: Payer: Self-pay

## 2024-03-14 ENCOUNTER — Ambulatory Visit (HOSPITAL_COMMUNITY)
Admission: EM | Admit: 2024-03-14 | Discharge: 2024-03-14 | Disposition: A | Discharge status: Home / Self Care | Payer: Self-pay | Attending: Family Medicine | Admitting: Family Medicine

## 2024-03-14 ENCOUNTER — Encounter (HOSPITAL_COMMUNITY): Payer: Self-pay

## 2024-03-14 ENCOUNTER — Emergency Department (HOSPITAL_COMMUNITY): Payer: Self-pay

## 2024-03-14 ENCOUNTER — Emergency Department (HOSPITAL_COMMUNITY)
Admission: EM | Admit: 2024-03-14 | Discharge: 2024-03-14 | Disposition: A | Discharge status: Home / Self Care | Payer: Self-pay | Attending: Emergency Medicine | Admitting: Emergency Medicine

## 2024-03-14 DIAGNOSIS — R1012 Left upper quadrant pain: Secondary | ICD-10-CM

## 2024-03-14 DIAGNOSIS — R1084 Generalized abdominal pain: Secondary | ICD-10-CM

## 2024-03-14 DIAGNOSIS — I509 Heart failure, unspecified: Secondary | ICD-10-CM | POA: Insufficient documentation

## 2024-03-14 DIAGNOSIS — R112 Nausea with vomiting, unspecified: Secondary | ICD-10-CM

## 2024-03-14 DIAGNOSIS — R1032 Left lower quadrant pain: Secondary | ICD-10-CM | POA: Insufficient documentation

## 2024-03-14 DIAGNOSIS — Z7982 Long term (current) use of aspirin: Secondary | ICD-10-CM | POA: Insufficient documentation

## 2024-03-14 DIAGNOSIS — R197 Diarrhea, unspecified: Secondary | ICD-10-CM

## 2024-03-14 LAB — CBC WITH DIFFERENTIAL/PLATELET
Abs Immature Granulocytes: 0 10*3/uL (ref 0.00–0.07)
Basophils Absolute: 0 10*3/uL (ref 0.0–0.1)
Basophils Relative: 1 %
Eosinophils Absolute: 0.1 10*3/uL (ref 0.0–0.5)
Eosinophils Relative: 1 %
HCT: 40.1 % (ref 36.0–46.0)
Hemoglobin: 12.6 g/dL (ref 12.0–15.0)
Immature Granulocytes: 0 %
Lymphocytes Relative: 26 %
Lymphs Abs: 1.4 10*3/uL (ref 0.7–4.0)
MCH: 25.3 pg — ABNORMAL LOW (ref 26.0–34.0)
MCHC: 31.4 g/dL (ref 30.0–36.0)
MCV: 80.5 fL (ref 80.0–100.0)
Monocytes Absolute: 0.5 10*3/uL (ref 0.1–1.0)
Monocytes Relative: 9 %
Neutro Abs: 3.4 10*3/uL (ref 1.7–7.7)
Neutrophils Relative %: 63 %
Platelets: 270 10*3/uL (ref 150–400)
RBC: 4.98 MIL/uL (ref 3.87–5.11)
RDW: 17.1 % — ABNORMAL HIGH (ref 11.5–15.5)
WBC: 5.4 10*3/uL (ref 4.0–10.5)
nRBC: 0 % (ref 0.0–0.2)

## 2024-03-14 LAB — COMPREHENSIVE METABOLIC PANEL WITH GFR
ALT: 18 U/L (ref 0–44)
AST: 23 U/L (ref 15–41)
Albumin: 3.8 g/dL (ref 3.5–5.0)
Alkaline Phosphatase: 56 U/L (ref 38–126)
Anion gap: 11 (ref 5–15)
BUN: 7 mg/dL — ABNORMAL LOW (ref 8–23)
CO2: 22 mmol/L (ref 22–32)
Calcium: 9.3 mg/dL (ref 8.9–10.3)
Chloride: 107 mmol/L (ref 98–111)
Creatinine, Ser: 0.76 mg/dL (ref 0.44–1.00)
GFR, Estimated: >60 mL/min (ref >60)
Glucose, Bld: 106 mg/dL — ABNORMAL HIGH (ref 70–99)
Potassium: 3.5 mmol/L (ref 3.5–5.1)
Sodium: 140 mmol/L (ref 135–145)
Total Bilirubin: 0.8 mg/dL (ref 0.0–1.2)
Total Protein: 6.6 g/dL (ref 6.5–8.1)

## 2024-03-14 LAB — LIPASE, BLOOD: Lipase: 28 U/L (ref 11–51)

## 2024-03-14 MED ORDER — ONDANSETRON 4 MG PO TBDP: 4.0000 mg | ORAL_TABLET | Freq: Three times a day (TID) | ORAL | 0 refills | Status: AC | PRN

## 2024-03-14 MED ORDER — DICYCLOMINE HCL 20 MG PO TABS: 20.0000 mg | ORAL_TABLET | Freq: Two times a day (BID) | ORAL | 0 refills | Status: AC | PRN

## 2024-03-14 NOTE — ED Provider Notes (Signed)
 Pt currently in the bathroom.  Not able to evaluate pt at this time.   Trish Furl, MD 03/14/24 1455

## 2024-03-14 NOTE — Discharge Instructions (Signed)
 I have asked patient to proceed to the emergency room for further evaluation and treatment.

## 2024-03-14 NOTE — ED Provider Notes (Signed)
 [facility] AT [facility] Provider Note  CSN: [id number] Arrival date & time: 03/14/24 1159  Chief Complaint(s) Abdominal Pain  HPI Carrie [name] is a 63 y.o. 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  States that since hospital discharge she has felt unwell.  She states that for 1 week she was using stimulant laxatives and is since discontinued this due to diarrhea.  She has been supplementing with multiple different over-the-counter supplements including magnesium containing products, psyllium, avocado and coconut oil, and has also been blending her food to "settle her stomach".  She also states that she may have seen a worm while showering coming from her rectum and is concerned because she helped take care of someone's dog that had a parasitic infection.  She endorses some mild left lower quadrant abdominal pain but denies chest pain, shortness of breath, headache, fever or other systemic symptoms.   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   Abdominal pain 02/23/2024   Constipation 02/23/2024   Dehydration 02/23/2024   Acute prerenal azotemia 02/23/2024   Chronic diastolic CHF (congestive heart failure) (HCC) 02/23/2024   Chronic iron deficiency anemia 02/23/2024   Bilateral pulmonary embolism (HCC) 03/25/2023   Acute anemia 03/25/2023   Obese 01/31/2022   Hypokalemia 01/31/2022   Hypophosphatemia 01/31/2022   Elevated troponin 01/28/2022   Sinus tachycardia 01/28/2022   Acute pulmonary embolism (HCC) 01/27/2022   Pulmonary emboli (HCC) 01/27/2022   Leukocytopenia 11/07/2010    DYSLEXIA 11/07/2010   HYPHEMA 11/07/2010   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/07/2010   OVARIAN CYST 11/07/2010   NONSPEC ELEVATION OF LEVELS OF TRANSAMINASE/LDH 11/07/2010   NEPHROLITHIASIS, HX OF 11/07/2010   Home Medication(s) Prior to Admission medications   Medication Sig Start Date End Date Taking? Authorizing Provider  aspirin  EC 81 MG tablet Take 81 mg by mouth See admin instructions. Take 81mg  (1 tablet) by mouth one to two times a week.    [provider]  docusate sodium  (COLACE) 100 MG capsule Take 1 capsule (100 mg total) by mouth at bedtime as needed for mild constipation. 02/25/24   Lorita Rosa, MD  ibuprofen  (ADVIL ) 200 MG tablet Take 200-800 mg by mouth every 6 (six) hours as needed for moderate pain (pain score 4-6).    [provider]  saccharomyces boulardii (FLORASTOR) 250 MG capsule Take 1 capsule (250 mg total) by mouth 2 (two) times daily. 02/25/24   Lorita Rosa, MD  senna-docusate (SENOKOT-S) 8.6-50 MG tablet Take 1 tablet by mouth daily. 02/25/24   Lorita Rosa, MD  torsemide  (DEMADEX ) 20 MG tablet Take 1 tablet (20 mg total) by mouth daily. 02/26/24   Lorita Rosa, MD  potassium chloride  (KLOR-CON ) 10 MEQ tablet Take 1 tablet (10 mEq total) by mouth daily for 7 days. 06/18/22 11/06/22  Torrin Crihfield, Alyse July, MD  Past Surgical History Past Surgical History:  Procedure Laterality Date   IR ANGIOGRAM PULMONARY BILATERAL SELECTIVE  01/28/2022   IR ANGIOGRAM SELECTIVE EACH ADDITIONAL VESSEL  01/28/2022   IR INFUSION THROMBOL ARTERIAL INITIAL (MS)  01/28/2022   IR INFUSION THROMBOL ARTERIAL INITIAL (MS)  01/28/2022   IR THROMB F/U EVAL ART/VEN FINAL DAY (MS)  01/29/2022   IR US  GUIDE VASC ACCESS RIGHT  01/28/2022   IR US  GUIDE VASC ACCESS RIGHT  01/28/2022   OVARIAN CYST REMOVAL     Family History Family History  Problem  Relation Age of Onset   Pulmonary embolism Father     Social History Social History   Tobacco Use   Smoking status: Never   Smokeless tobacco: Never  Vaping Use   Vaping status: Never Used  Substance Use Topics   Alcohol use: No   Drug use: No   Allergies Iodinated contrast media, Ketamine, Gluten meal, Lactose intolerance (gi), Other, and Cephalexin   Review of Systems Review of Systems  Gastrointestinal:  Positive for abdominal pain and diarrhea.    Physical Exam Vital Signs  I have reviewed the triage vital signs BP (!) 150/89   Pulse 100   Temp 97.8 F (36.6 C)   Resp 18   LMP 01/22/2014   SpO2 95%   Physical Exam Vitals and nursing note reviewed.  Constitutional:      General: She is not in acute distress.    Appearance: She is well-developed.  HENT:     Head: Normocephalic and atraumatic.  Eyes:     Conjunctiva/sclera: Conjunctivae normal.  Cardiovascular:     Rate and Rhythm: Normal rate and regular rhythm.     Heart sounds: No murmur heard. Pulmonary:     Effort: Pulmonary effort is normal. No respiratory distress.     Breath sounds: Normal breath sounds.  Abdominal:     Palpations: Abdomen is soft.     Tenderness: There is no abdominal tenderness.  Musculoskeletal:        General: No swelling.     Cervical back: Neck supple.  Skin:    General: Skin is warm and dry.     Capillary Refill: Capillary refill takes less than 2 seconds.  Neurological:     Mental Status: She is alert.  Psychiatric:        Mood and Affect: Mood normal.     ED Results and Treatments Labs (all labs ordered are listed, but only abnormal results are displayed) Labs Reviewed  CBC WITH DIFFERENTIAL/PLATELET - Abnormal; Notable for the following components:      Result Value   MCH 25.3 (*)    RDW 17.1 (*)    All other components within normal limits  COMPREHENSIVE METABOLIC PANEL WITH GFR - Abnormal; Notable for the following components:   Glucose, Bld 106 (*)     BUN 7 (*)    All other components within normal limits  GASTROINTESTINAL PANEL BY PCR, STOOL (REPLACES STOOL CULTURE)  OVA + PARASITE EXAM  LIPASE, BLOOD  URINALYSIS, ROUTINE W REFLEX MICROSCOPIC  Radiology CT ABDOMEN PELVIS WO CONTRAST Result Date: 03/14/2024 EXAM: CT ABDOMEN AND PELVIS WITHOUT CONTRAST 03/14/2024 12:53:52 PM TECHNIQUE: CT of the abdomen and pelvis was performed without the administration of intravenous contrast. Multiplanar reformatted images are provided for review. Automated exposure control, iterative reconstruction, and/or weight based adjustment of the mA/kV was utilized to reduce the radiation dose to as low as reasonably achievable. COMPARISON: CT of the abdomen and pelvis without contrast 02/23/2024. CLINICAL HISTORY: Left lower quadrant pain with reported left ovarian mass pressing on colon. FINDINGS: LOWER CHEST: No acute abnormality. LIVER: A 10 mm simple cyst in the left lobe of the liver on image 7 of series 3 is stable. GALLBLADDER AND BILE DUCTS: Gallbladder is unremarkable. No biliary ductal dilatation. SPLEEN: No acute abnormality. PANCREAS: No acute abnormality. ADRENAL GLANDS: No acute abnormality. KIDNEYS, URETERS AND BLADDER: No stones in the kidneys or ureters. No hydronephrosis. No perinephric or periureteral stranding. Urinary bladder is unremarkable. GI AND BOWEL: Stomach demonstrates no acute abnormality. There is no bowel obstruction. No appendicitis. PERITONEUM AND RETROPERITONEUM: No ascites. No free air. VASCULATURE: Aorta is normal in caliber. LYMPH NODES: No lymphadenopathy. REPRODUCTIVE ORGANS: A 15 mm anterior uterine fibroid is again noted. BONES AND SOFT TISSUES: No acute osseous abnormality. No focal soft tissue abnormality. IMPRESSION: 1. No acute findings. 2. 15 mm anterior uterine fibroid. Electronically signed by: Audree Leas MD 03/14/2024 01:16 PM EDT RP Workstation: [id number]    Pertinent labs & imaging results that were available during my care of the patient were reviewed by me and considered in my medical decision making (see MDM for details).  Medications Ordered in ED Medications - No data to display                                                                                                                                   Procedures Procedures  (including critical care time)  Medical Decision Making / ED Course   This patient presents to the ED for concern of diarrhea, abdominal pain, this involves an extensive number of treatment options, and is a complaint that carries with it a high risk of complications and morbidity.  The differential diagnosis includes diverticulitis, epiploic appendagitis, colitis, gastroenteritis, constipation, nephrolithiasis, inflammatory bowel disease,  MDM: Patient seen emergency room for evaluation of abdominal pain and diarrhea.  Physical exam is largely unremarkable with a soft nontender abdomen.  Laboratory evaluation is reassuringly unremarkable with no leukocytosis, hepatic function panel unremarkable and lipase is normal.  CT abdomen pelvis without evidence of infection or any acute pathology in the abdomen.  We had an extensive discussion about her symptoms and do suspect that patient's symptoms are secondary to her aggressive dietary changes and both prescription and nonprescription stimulant laxative use.  She was strongly encouraged to stop all additional "natural" supplements, stop stimulant laxative use, she has not had a bowel movement in 24 hours, and resume a regular diet.  Do suspect that her liquid diet is contributing to her liquid stools.  She was given Zofran  for nausea and Bentyl for abdominal cramping to use at home as needed.  She was observed in the emergency room for 4-1/2 hours and did not have a bowel movement and thus I have overall low  suspicion for C. difficile despite recent antibiotic use.  She does not have significant eosinophilia and I have very low suspicion for parasitic infection.  She was able to tolerate p.o. without difficulty here in the emergency department including solid foods.  At this time she does not meet inpatient criteria for admission will be discharged with outpatient follow-up.   Additional history obtained: -External records from outside source obtained and reviewed including: Chart review including previous notes, labs, imaging, consultation notes   Lab Tests: -I ordered, reviewed, and interpreted labs.   The pertinent results include:   Labs Reviewed  CBC WITH DIFFERENTIAL/PLATELET - Abnormal; Notable for the following components:      Result Value   MCH 25.3 (*)    RDW 17.1 (*)    All other components within normal limits  COMPREHENSIVE METABOLIC PANEL WITH GFR - Abnormal; Notable for the following components:   Glucose, Bld 106 (*)    BUN 7 (*)    All other components within normal limits  GASTROINTESTINAL PANEL BY PCR, STOOL (REPLACES STOOL CULTURE)  OVA + PARASITE EXAM  LIPASE, BLOOD  URINALYSIS, ROUTINE W REFLEX MICROSCOPIC      Imaging Studies ordered: I ordered imaging studies including CTAP I independently visualized and interpreted imaging. I agree with the radiologist interpretation   Medicines ordered and prescription drug management: No orders of the defined types were placed in this encounter.   -I have reviewed the patients home medicines and have made adjustments as needed  Critical interventions none    Social Determinants of Health:  Factors impacting patients care include: none   Reevaluation: After the interventions noted above, I reevaluated the patient and found that they have :improved  Co morbidities that complicate the patient evaluation  Past Medical History:  Diagnosis Date   Arthritis    DVT (deep venous thrombosis) (HCC)    High  cholesterol    Kidney stones    Ovarian cyst    Pulmonary embolism (HCC)       Dispostion: I considered admission for this patient, but at this time she does not meet inpatient criteria for admission and will be discharged with outpatient follow-up.     Final Clinical Impression(s) / ED Diagnoses Final diagnoses:  None     @PCDICTATION @    Karlyn Overman, MD 03/14/24 [phone]

## 2024-03-14 NOTE — ED Provider Notes (Addendum)
 [facility]    CSN: [id number] Arrival date & time: 03/14/24  1020      History   Chief Complaint Chief Complaint  Patient presents with   Abdominal Pain    HPI Carrie [name] is a 63 y.o. female.    Abdominal Pain Here for abdominal pain that has been worsening.  She has been having nausea and vomiting and now diarrhea.  The diarrhea has been numerous times a day and she has been vomiting in the last 48 hours about 4 or 5 times a day.  No blood in the stool.  Yesterday when she was in the shower she saw what she thought might be a warm but did not get a picture of it before it went down the drain.  Also in the toilet bowl she had seen some white dots that she thought might be eggs.  Also she did not get a picture of that.  No fever noted.  The pain has worsened in the last week and has been more severe, 8 out of 10 in the last 24 hours. She has been hurting for a month or more.  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  She did finish her antibiotics as instructed.     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   Abdominal pain 02/23/2024   Constipation 02/23/2024   Dehydration 02/23/2024   Acute prerenal azotemia 02/23/2024   Chronic diastolic CHF (congestive heart failure) (HCC) 02/23/2024   Chronic iron deficiency anemia 02/23/2024   Bilateral pulmonary embolism (HCC) 03/25/2023   Acute anemia 03/25/2023   Obese 01/31/2022   Hypokalemia 01/31/2022   Hypophosphatemia 01/31/2022   Elevated troponin 01/28/2022   Sinus tachycardia 01/28/2022   Acute pulmonary embolism (HCC) 01/27/2022   Pulmonary emboli (HCC) 01/27/2022   Leukocytopenia 11/07/2010   DYSLEXIA 11/07/2010   HYPHEMA 11/07/2010    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/07/2010   OVARIAN CYST 11/07/2010   NONSPEC ELEVATION OF LEVELS OF TRANSAMINASE/LDH 11/07/2010   NEPHROLITHIASIS, HX OF 11/07/2010    Past Surgical History:  Procedure Laterality Date   IR ANGIOGRAM PULMONARY BILATERAL SELECTIVE  01/28/2022   IR ANGIOGRAM SELECTIVE EACH ADDITIONAL VESSEL  01/28/2022   IR INFUSION THROMBOL ARTERIAL INITIAL (MS)  01/28/2022   IR INFUSION THROMBOL ARTERIAL INITIAL (MS)  01/28/2022   IR THROMB F/U EVAL ART/VEN FINAL DAY (MS)  01/29/2022   IR US  GUIDE VASC ACCESS RIGHT  01/28/2022   IR US  GUIDE VASC ACCESS RIGHT  01/28/2022   OVARIAN CYST REMOVAL      OB History   No obstetric history on file.      Home Medications    Prior to Admission medications   Medication Sig Start Date End Date Taking? Authorizing Provider  aspirin  EC 81 MG tablet Take 81 mg by mouth See admin instructions. Take 81mg  (1 tablet) by mouth one to two times a week.    [provider]  docusate sodium  (COLACE) 100 MG capsule Take 1 capsule (100 mg total) by mouth at bedtime as needed for mild constipation. 02/25/24   Lorita Rosa, MD  ibuprofen  (ADVIL ) 200 MG tablet Take 200-800 mg by mouth every 6 (six)  hours as needed for moderate pain (pain score 4-6).    [provider]  saccharomyces boulardii (FLORASTOR) 250 MG capsule Take 1 capsule (250 mg total) by mouth 2 (two) times daily. 02/25/24   Lorita Rosa, MD  senna-docusate (SENOKOT-S) 8.6-50 MG tablet Take 1 tablet by mouth daily. 02/25/24   Lorita Rosa, MD  torsemide  (DEMADEX ) 20 MG tablet Take 1 tablet (20 mg total) by mouth daily. 02/26/24   Lorita Rosa, MD  potassium chloride  (KLOR-CON ) 10 MEQ tablet Take 1 tablet (10 mEq total) by mouth daily for 7 days. 06/18/22 11/06/22  Kommor, Alyse July, MD    Family History Family History  Problem Relation Age of Onset   Pulmonary embolism Father     Social History Social History   Tobacco Use   Smoking status: Never   Smokeless  tobacco: Never  Vaping Use   Vaping status: Never Used  Substance Use Topics   Alcohol use: No   Drug use: No     Allergies   Iodinated contrast media, Ketamine, Gluten meal, Lactose intolerance (gi), Other, and Cephalexin    Review of Systems Review of Systems  Gastrointestinal:  Positive for abdominal pain.     Physical Exam Triage Vital Signs ED Triage Vitals  Encounter Vitals Group     BP 03/14/24 1052 126/84     Systolic BP Percentile --      Diastolic BP Percentile --      Pulse Rate 03/14/24 1052 93     Resp 03/14/24 1052 20     Temp 03/14/24 1052 97.8 F (36.6 C)     Temp Source 03/14/24 1052 Oral     SpO2 03/14/24 1052 96 %     Weight 03/14/24 1054 214 lb 11.7 oz (97.4 kg)     Height 03/14/24 1054 5\' 3"  (1.6 m)     Head Circumference --      Peak Flow --      Pain Score 03/14/24 1054 8     Pain Loc --      Pain Education --      Exclude from Growth Chart --    No data found.  Updated Vital Signs BP 126/84 (BP Location: Left Arm)   Pulse 93   Temp 97.8 F (36.6 C) (Oral)   Resp 20   Ht 5\' 3"  (1.6 m)   Wt 97.4 kg   LMP 01/22/2014   SpO2 96%   BMI 38.04 kg/m   Visual Acuity Right Eye Distance:   Left Eye Distance:   Bilateral Distance:    Right Eye Near:   Left Eye Near:    Bilateral Near:     Physical Exam Vitals reviewed.  Constitutional:      Appearance: She is not ill-appearing, toxic-appearing or diaphoretic.     Comments: She has a pained facies.  HENT:     Mouth/Throat:     Mouth: Mucous membranes are moist.     Pharynx: No oropharyngeal exudate or posterior oropharyngeal erythema.  Eyes:     Extraocular Movements: Extraocular movements intact.     Conjunctiva/sclera: Conjunctivae normal.     Pupils: Pupils are equal, round, and reactive to light.  Cardiovascular:     Rate and Rhythm: Normal rate and regular rhythm.     Heart sounds: No murmur heard. Pulmonary:     Effort: Pulmonary effort is normal. No respiratory  distress.     Breath sounds: No stridor. No wheezing, rhonchi or rales.  Abdominal:  Tenderness: There is abdominal tenderness.     Comments: Bowel sounds are hyperactive.  Musculoskeletal:     Cervical back: Neck supple.  Lymphadenopathy:     Cervical: No cervical adenopathy.  Skin:    Capillary Refill: Capillary refill takes less than 2 seconds.     Coloration: Skin is not jaundiced or pale.  Neurological:     General: No focal deficit present.     Mental Status: She is alert and oriented to person, place, and time.  Psychiatric:        Behavior: Behavior normal.      UC Treatments / Results  Labs (all labs ordered are listed, but only abnormal results are displayed) Labs Reviewed - No data to display  EKG   Radiology No results found.  Procedures Procedures (including critical care time)  Medications Ordered in UC Medications - No data to display  Initial Impression / Assessment and Plan / UC Course  I have reviewed the triage vital signs and the nursing notes.  Pertinent labs & imaging results that were available during my care of the patient were reviewed by me and considered in my medical decision making (see chart for details).     Since she is tachycardic and in obvious discomfort, I have asked her to proceed to the emergency room where she can be evaluated on an urgent basis for her symptoms.  She is agreeable initially and is to go to the emergency room.  Before I got to finish this note, the patient has come back in to be checked then.  She is stated to the front office staff that I was dismissive and not respectful.  She states that I was dismissive of her symptoms and did not show any concern.  Please note that I did a full history and physical on this patient and I discussed with her that treating her empirically for her symptoms with her having such severe pain, and persistent nausea and vomiting was not in her best interest.  Nursing staff are  going to discuss with her in triage and see if she needs to check back in here or if she is willing to go to the emergency room.   When nursing staff went to the waiting room to pull her to triage to talk with her, she was no longer there.  She has not checked into the emergency room Final Clinical Impressions(s) / UC Diagnoses   Final diagnoses:  Generalized abdominal pain  Nausea vomiting and diarrhea     Discharge Instructions      I have asked patient to proceed to the emergency room for further evaluation and treatment.  ED Prescriptions   None    PDMP not reviewed this encounter.   Ann Keto, MD 03/14/24 1148    Ann Keto, MD 03/14/24 [phone]

## 2024-03-14 NOTE — ED Triage Notes (Signed)
 Pt came in via POV d/t abd pain with nausea & diarrhea, states that she has been helping with her friend that is fostering a sick dog that had worms & states that she had passed a worm in the shower recently. A/Ox4, rates her pain 8/10, very anxious during triage.

## 2024-03-14 NOTE — ED Notes (Signed)
 Patient is being discharged from the Urgent Care and sent to the Emergency Department via private vehicle . Per Ellsworth Haas MD, patient is in need of higher level of care due to reported abdominal pain and worms in stool. Patient is aware and verbalizes understanding of plan of care.  Vitals:   03/14/24 1052  BP: 126/84  Pulse: 93  Resp: 20  Temp: 97.8 F (36.6 C)  SpO2: 96%

## 2024-03-14 NOTE — ED Provider Triage Note (Signed)
 Emergency Medicine Provider Triage Evaluation Note  Carrie [name] , a 63 y.o. female  was evaluated in triage.  Pt complains of left lower quadrant pain.  Patient states that she has had this since she was discharged earlier this month due to a left ovarian fibroid that is pressing on her colon.  Patient states that she originally was taking garlic supplements that was allowing her to have liquid stools however began having too much and started having diarrhea and stopped taking the garlic and is continue to have diarrhea along with emesis.  Patient Nuys any vomiting.  Patient states she is also concerned that she may have worms as her friend's dog is roundworm and her friend got sick with worms and yesterday in the shower she stated that she had a brown worm come out of her rectum.  Review of Systems  Positive:  Negative:   Physical Exam  LMP 01/22/2014  Gen:   Awake, no distress   Resp:  Normal effort  MSK:   Moves extremities without difficulty  Other:  Left lower quadrant tenderness without peritoneal signs  Medical Decision Making  Medically screening exam initiated at 12:12 PM.  Appropriate orders placed.  Nigel Wessman was informed that the remainder of the evaluation will be completed by another provider, this initial triage assessment does not replace that evaluation, and the importance of remaining in the ED until their evaluation is complete.  Workup initiated, patient stable at this time   Elex Grimmer 03/14/24 1213

## 2024-03-14 NOTE — ED Triage Notes (Signed)
 Pt presents with complaints of generalized abdominal pain x over one month. Pt describes the pain as a cramping sensation. Pt is also reporting nausea and diarrhea. States she became concerned when she saw a brown worm in the toilet bowl and "moving dots, thinking it is the eggs." Pt currently rates her overall pain an 8/10. Pt has only been taking a stool softener for symptoms.

## 2024-03-14 NOTE — ED Notes (Signed)
 Pt refused CT when they came to transport her there during triage.

## 2024-09-25 ENCOUNTER — Other Ambulatory Visit: Payer: Self-pay
